# Patient Record
Sex: Male | Born: 1950 | Race: Black or African American | Hispanic: No | Marital: Single | Smoking: Former smoker
Health system: Southern US, Community
[De-identification: ages and names within clinical notes are randomized; demographics above are authoritative.]

## PROBLEM LIST (undated history)

## (undated) DIAGNOSIS — J9600 Acute respiratory failure, unspecified whether with hypoxia or hypercapnia: Secondary | ICD-10-CM

## (undated) DIAGNOSIS — I639 Cerebral infarction, unspecified: Secondary | ICD-10-CM

## (undated) DIAGNOSIS — I10 Essential (primary) hypertension: Secondary | ICD-10-CM

## (undated) DIAGNOSIS — I4891 Unspecified atrial fibrillation: Secondary | ICD-10-CM

## (undated) DIAGNOSIS — G839 Paralytic syndrome, unspecified: Secondary | ICD-10-CM

## (undated) HISTORY — PX: TRACHEOSTOMY: SUR1362

## (undated) HISTORY — PX: PEG TUBE PLACEMENT: SUR1034

---

## 2020-12-30 DIAGNOSIS — J189 Pneumonia, unspecified organism: Secondary | ICD-10-CM | POA: Diagnosis not present

## 2020-12-30 DIAGNOSIS — N189 Chronic kidney disease, unspecified: Secondary | ICD-10-CM | POA: Diagnosis not present

## 2020-12-30 DIAGNOSIS — G40911 Epilepsy, unspecified, intractable, with status epilepticus: Secondary | ICD-10-CM | POA: Diagnosis not present

## 2020-12-30 DIAGNOSIS — I482 Chronic atrial fibrillation, unspecified: Secondary | ICD-10-CM | POA: Diagnosis not present

## 2020-12-30 DIAGNOSIS — G8250 Quadriplegia, unspecified: Secondary | ICD-10-CM | POA: Diagnosis not present

## 2020-12-30 DIAGNOSIS — R6521 Severe sepsis with septic shock: Secondary | ICD-10-CM

## 2020-12-30 DIAGNOSIS — J9621 Acute and chronic respiratory failure with hypoxia: Secondary | ICD-10-CM | POA: Diagnosis not present

## 2020-12-31 DIAGNOSIS — I482 Chronic atrial fibrillation, unspecified: Secondary | ICD-10-CM | POA: Diagnosis not present

## 2020-12-31 DIAGNOSIS — J9621 Acute and chronic respiratory failure with hypoxia: Secondary | ICD-10-CM | POA: Diagnosis not present

## 2020-12-31 DIAGNOSIS — R6521 Severe sepsis with septic shock: Secondary | ICD-10-CM

## 2020-12-31 DIAGNOSIS — G8250 Quadriplegia, unspecified: Secondary | ICD-10-CM | POA: Diagnosis not present

## 2020-12-31 DIAGNOSIS — N189 Chronic kidney disease, unspecified: Secondary | ICD-10-CM

## 2020-12-31 DIAGNOSIS — J189 Pneumonia, unspecified organism: Secondary | ICD-10-CM | POA: Diagnosis not present

## 2020-12-31 DIAGNOSIS — G40911 Epilepsy, unspecified, intractable, with status epilepticus: Secondary | ICD-10-CM

## 2021-01-07 DIAGNOSIS — G40911 Epilepsy, unspecified, intractable, with status epilepticus: Secondary | ICD-10-CM

## 2021-01-07 DIAGNOSIS — J9621 Acute and chronic respiratory failure with hypoxia: Secondary | ICD-10-CM

## 2021-01-07 DIAGNOSIS — R6521 Severe sepsis with septic shock: Secondary | ICD-10-CM | POA: Diagnosis not present

## 2021-01-07 DIAGNOSIS — N189 Chronic kidney disease, unspecified: Secondary | ICD-10-CM

## 2021-01-08 DIAGNOSIS — J9621 Acute and chronic respiratory failure with hypoxia: Secondary | ICD-10-CM | POA: Diagnosis not present

## 2021-01-08 DIAGNOSIS — N189 Chronic kidney disease, unspecified: Secondary | ICD-10-CM | POA: Diagnosis not present

## 2021-01-08 DIAGNOSIS — G40911 Epilepsy, unspecified, intractable, with status epilepticus: Secondary | ICD-10-CM | POA: Diagnosis not present

## 2021-01-08 DIAGNOSIS — R6521 Severe sepsis with septic shock: Secondary | ICD-10-CM | POA: Diagnosis not present

## 2021-01-09 DIAGNOSIS — R6521 Severe sepsis with septic shock: Secondary | ICD-10-CM | POA: Diagnosis not present

## 2021-01-09 DIAGNOSIS — G40911 Epilepsy, unspecified, intractable, with status epilepticus: Secondary | ICD-10-CM | POA: Diagnosis not present

## 2021-01-09 DIAGNOSIS — J9621 Acute and chronic respiratory failure with hypoxia: Secondary | ICD-10-CM | POA: Diagnosis not present

## 2021-01-09 DIAGNOSIS — N189 Chronic kidney disease, unspecified: Secondary | ICD-10-CM | POA: Diagnosis not present

## 2021-01-10 DIAGNOSIS — N189 Chronic kidney disease, unspecified: Secondary | ICD-10-CM

## 2021-01-10 DIAGNOSIS — R6521 Severe sepsis with septic shock: Secondary | ICD-10-CM

## 2021-01-10 DIAGNOSIS — J9621 Acute and chronic respiratory failure with hypoxia: Secondary | ICD-10-CM

## 2021-01-10 DIAGNOSIS — G40911 Epilepsy, unspecified, intractable, with status epilepticus: Secondary | ICD-10-CM

## 2021-01-12 DIAGNOSIS — J9621 Acute and chronic respiratory failure with hypoxia: Secondary | ICD-10-CM | POA: Diagnosis not present

## 2021-01-12 DIAGNOSIS — G40911 Epilepsy, unspecified, intractable, with status epilepticus: Secondary | ICD-10-CM | POA: Diagnosis not present

## 2021-01-12 DIAGNOSIS — R6521 Severe sepsis with septic shock: Secondary | ICD-10-CM | POA: Diagnosis not present

## 2021-01-12 DIAGNOSIS — N189 Chronic kidney disease, unspecified: Secondary | ICD-10-CM | POA: Diagnosis not present

## 2021-01-13 DIAGNOSIS — J9621 Acute and chronic respiratory failure with hypoxia: Secondary | ICD-10-CM | POA: Diagnosis not present

## 2021-01-13 DIAGNOSIS — G40911 Epilepsy, unspecified, intractable, with status epilepticus: Secondary | ICD-10-CM | POA: Diagnosis not present

## 2021-01-13 DIAGNOSIS — R6521 Severe sepsis with septic shock: Secondary | ICD-10-CM | POA: Diagnosis not present

## 2021-01-13 DIAGNOSIS — N189 Chronic kidney disease, unspecified: Secondary | ICD-10-CM | POA: Diagnosis not present

## 2021-01-14 DIAGNOSIS — G40911 Epilepsy, unspecified, intractable, with status epilepticus: Secondary | ICD-10-CM | POA: Diagnosis not present

## 2021-01-14 DIAGNOSIS — R6521 Severe sepsis with septic shock: Secondary | ICD-10-CM | POA: Diagnosis not present

## 2021-01-14 DIAGNOSIS — J9621 Acute and chronic respiratory failure with hypoxia: Secondary | ICD-10-CM | POA: Diagnosis not present

## 2021-01-14 DIAGNOSIS — N189 Chronic kidney disease, unspecified: Secondary | ICD-10-CM | POA: Diagnosis not present

## 2021-01-15 DIAGNOSIS — J9621 Acute and chronic respiratory failure with hypoxia: Secondary | ICD-10-CM | POA: Diagnosis not present

## 2021-01-15 DIAGNOSIS — R6521 Severe sepsis with septic shock: Secondary | ICD-10-CM | POA: Diagnosis not present

## 2021-01-15 DIAGNOSIS — N189 Chronic kidney disease, unspecified: Secondary | ICD-10-CM | POA: Diagnosis not present

## 2021-01-15 DIAGNOSIS — G40911 Epilepsy, unspecified, intractable, with status epilepticus: Secondary | ICD-10-CM | POA: Diagnosis not present

## 2021-01-24 DIAGNOSIS — G40911 Epilepsy, unspecified, intractable, with status epilepticus: Secondary | ICD-10-CM | POA: Diagnosis not present

## 2021-01-24 DIAGNOSIS — N19 Unspecified kidney failure: Secondary | ICD-10-CM

## 2021-01-24 DIAGNOSIS — J9621 Acute and chronic respiratory failure with hypoxia: Secondary | ICD-10-CM

## 2021-01-24 DIAGNOSIS — R6521 Severe sepsis with septic shock: Secondary | ICD-10-CM

## 2021-02-01 ENCOUNTER — Inpatient Hospital Stay (HOSPITAL_COMMUNITY)
Admission: EM | Admit: 2021-02-01 | Discharge: 2021-02-15 | DRG: 377 | Disposition: A | Discharge status: Other Acute Inpatient Hospital | Payer: Medicare Other | Attending: Internal Medicine | Admitting: Internal Medicine

## 2021-02-01 ENCOUNTER — Encounter (HOSPITAL_COMMUNITY): Payer: Self-pay

## 2021-02-01 ENCOUNTER — Other Ambulatory Visit: Payer: Self-pay

## 2021-02-01 ENCOUNTER — Emergency Department (HOSPITAL_COMMUNITY): Payer: Medicare Other

## 2021-02-01 DIAGNOSIS — R627 Adult failure to thrive: Secondary | ICD-10-CM | POA: Diagnosis present

## 2021-02-01 DIAGNOSIS — L89150 Pressure ulcer of sacral region, unstageable: Secondary | ICD-10-CM | POA: Diagnosis present

## 2021-02-01 DIAGNOSIS — Z930 Tracheostomy status: Secondary | ICD-10-CM

## 2021-02-01 DIAGNOSIS — K7200 Acute and subacute hepatic failure without coma: Secondary | ICD-10-CM | POA: Diagnosis not present

## 2021-02-01 DIAGNOSIS — R58 Hemorrhage, not elsewhere classified: Secondary | ICD-10-CM | POA: Diagnosis present

## 2021-02-01 DIAGNOSIS — E870 Hyperosmolality and hypernatremia: Secondary | ICD-10-CM | POA: Diagnosis present

## 2021-02-01 DIAGNOSIS — A4102 Sepsis due to Methicillin resistant Staphylococcus aureus: Secondary | ICD-10-CM | POA: Diagnosis not present

## 2021-02-01 DIAGNOSIS — R911 Solitary pulmonary nodule: Secondary | ICD-10-CM | POA: Diagnosis present

## 2021-02-01 DIAGNOSIS — Z6828 Body mass index (BMI) 28.0-28.9, adult: Secondary | ICD-10-CM

## 2021-02-01 DIAGNOSIS — R578 Other shock: Secondary | ICD-10-CM | POA: Diagnosis not present

## 2021-02-01 DIAGNOSIS — D649 Anemia, unspecified: Secondary | ICD-10-CM

## 2021-02-01 DIAGNOSIS — I6330 Cerebral infarction due to thrombosis of unspecified cerebral artery: Secondary | ICD-10-CM | POA: Insufficient documentation

## 2021-02-01 DIAGNOSIS — Z87891 Personal history of nicotine dependence: Secondary | ICD-10-CM

## 2021-02-01 DIAGNOSIS — R6521 Severe sepsis with septic shock: Secondary | ICD-10-CM | POA: Diagnosis not present

## 2021-02-01 DIAGNOSIS — J9600 Acute respiratory failure, unspecified whether with hypoxia or hypercapnia: Secondary | ICD-10-CM

## 2021-02-01 DIAGNOSIS — Z8673 Personal history of transient ischemic attack (TIA), and cerebral infarction without residual deficits: Secondary | ICD-10-CM

## 2021-02-01 DIAGNOSIS — J15212 Pneumonia due to Methicillin resistant Staphylococcus aureus: Secondary | ICD-10-CM | POA: Diagnosis present

## 2021-02-01 DIAGNOSIS — K264 Chronic or unspecified duodenal ulcer with hemorrhage: Secondary | ICD-10-CM | POA: Diagnosis not present

## 2021-02-01 DIAGNOSIS — K2971 Gastritis, unspecified, with bleeding: Secondary | ICD-10-CM | POA: Diagnosis present

## 2021-02-01 DIAGNOSIS — E876 Hypokalemia: Secondary | ICD-10-CM | POA: Diagnosis not present

## 2021-02-01 DIAGNOSIS — E872 Acidosis: Secondary | ICD-10-CM | POA: Diagnosis present

## 2021-02-01 DIAGNOSIS — J9621 Acute and chronic respiratory failure with hypoxia: Secondary | ICD-10-CM | POA: Diagnosis present

## 2021-02-01 DIAGNOSIS — G9340 Encephalopathy, unspecified: Secondary | ICD-10-CM

## 2021-02-01 DIAGNOSIS — J90 Pleural effusion, not elsewhere classified: Secondary | ICD-10-CM | POA: Diagnosis present

## 2021-02-01 DIAGNOSIS — K625 Hemorrhage of anus and rectum: Secondary | ICD-10-CM | POA: Diagnosis not present

## 2021-02-01 DIAGNOSIS — R64 Cachexia: Secondary | ICD-10-CM | POA: Diagnosis present

## 2021-02-01 DIAGNOSIS — I4891 Unspecified atrial fibrillation: Secondary | ICD-10-CM | POA: Diagnosis present

## 2021-02-01 DIAGNOSIS — D62 Acute posthemorrhagic anemia: Secondary | ICD-10-CM | POA: Diagnosis present

## 2021-02-01 DIAGNOSIS — L89310 Pressure ulcer of right buttock, unstageable: Secondary | ICD-10-CM | POA: Diagnosis present

## 2021-02-01 DIAGNOSIS — E875 Hyperkalemia: Secondary | ICD-10-CM | POA: Diagnosis present

## 2021-02-01 DIAGNOSIS — I6603 Occlusion and stenosis of bilateral middle cerebral arteries: Secondary | ICD-10-CM | POA: Diagnosis not present

## 2021-02-01 DIAGNOSIS — G8250 Quadriplegia, unspecified: Secondary | ICD-10-CM | POA: Diagnosis present

## 2021-02-01 DIAGNOSIS — G928 Other toxic encephalopathy: Secondary | ICD-10-CM | POA: Diagnosis present

## 2021-02-01 DIAGNOSIS — K5900 Constipation, unspecified: Secondary | ICD-10-CM | POA: Diagnosis present

## 2021-02-01 DIAGNOSIS — N170 Acute kidney failure with tubular necrosis: Secondary | ICD-10-CM | POA: Diagnosis present

## 2021-02-01 DIAGNOSIS — L89120 Pressure ulcer of left upper back, unstageable: Secondary | ICD-10-CM | POA: Diagnosis present

## 2021-02-01 DIAGNOSIS — I480 Paroxysmal atrial fibrillation: Secondary | ICD-10-CM | POA: Diagnosis present

## 2021-02-01 DIAGNOSIS — N179 Acute kidney failure, unspecified: Secondary | ICD-10-CM | POA: Diagnosis present

## 2021-02-01 DIAGNOSIS — L89320 Pressure ulcer of left buttock, unstageable: Secondary | ICD-10-CM | POA: Diagnosis present

## 2021-02-01 DIAGNOSIS — R29738 NIHSS score 38: Secondary | ICD-10-CM | POA: Diagnosis present

## 2021-02-01 DIAGNOSIS — Z20822 Contact with and (suspected) exposure to covid-19: Secondary | ICD-10-CM | POA: Diagnosis present

## 2021-02-01 DIAGNOSIS — I6381 Other cerebral infarction due to occlusion or stenosis of small artery: Secondary | ICD-10-CM | POA: Diagnosis not present

## 2021-02-01 DIAGNOSIS — J189 Pneumonia, unspecified organism: Secondary | ICD-10-CM

## 2021-02-01 DIAGNOSIS — Z430 Encounter for attention to tracheostomy: Secondary | ICD-10-CM

## 2021-02-01 DIAGNOSIS — L8990 Pressure ulcer of unspecified site, unspecified stage: Secondary | ICD-10-CM | POA: Insufficient documentation

## 2021-02-01 DIAGNOSIS — K2101 Gastro-esophageal reflux disease with esophagitis, with bleeding: Secondary | ICD-10-CM | POA: Diagnosis present

## 2021-02-01 DIAGNOSIS — Z931 Gastrostomy status: Secondary | ICD-10-CM

## 2021-02-01 DIAGNOSIS — I10 Essential (primary) hypertension: Secondary | ICD-10-CM | POA: Diagnosis present

## 2021-02-01 HISTORY — DX: Unspecified atrial fibrillation: I48.91

## 2021-02-01 HISTORY — DX: Paralytic syndrome, unspecified: I63.9

## 2021-02-01 HISTORY — DX: Acute respiratory failure, unspecified whether with hypoxia or hypercapnia: J96.00

## 2021-02-01 HISTORY — DX: Cerebral infarction, unspecified: G83.9

## 2021-02-01 HISTORY — DX: Essential (primary) hypertension: I10

## 2021-02-01 LAB — BASIC METABOLIC PANEL
Anion gap: 11 (ref 5–15)
BUN: 87 mg/dL — ABNORMAL HIGH (ref 8–23)
CO2: 23 mmol/L (ref 22–32)
Calcium: 10.9 mg/dL — ABNORMAL HIGH (ref 8.9–10.3)
Chloride: 116 mmol/L — ABNORMAL HIGH (ref 98–111)
Creatinine, Ser: 2.25 mg/dL — ABNORMAL HIGH (ref 0.61–1.24)
GFR, Estimated: 30 mL/min — ABNORMAL LOW (ref >60)
Glucose, Bld: 132 mg/dL — ABNORMAL HIGH (ref 70–99)
Potassium: 5.4 mmol/L — ABNORMAL HIGH (ref 3.5–5.1)
Sodium: 150 mmol/L — ABNORMAL HIGH (ref 135–145)

## 2021-02-01 LAB — CBC WITH DIFFERENTIAL/PLATELET
Abs Immature Granulocytes: 0.24 10*3/uL — ABNORMAL HIGH (ref 0.00–0.07)
Basophils Absolute: 0.1 10*3/uL (ref 0.0–0.1)
Basophils Relative: 0 %
Eosinophils Absolute: 0.1 10*3/uL (ref 0.0–0.5)
Eosinophils Relative: 1 %
HCT: 26 % — ABNORMAL LOW (ref 39.0–52.0)
Hemoglobin: 7.5 g/dL — ABNORMAL LOW (ref 13.0–17.0)
Immature Granulocytes: 1 %
Lymphocytes Relative: 15 %
Lymphs Abs: 3.2 10*3/uL (ref 0.7–4.0)
MCH: 25.9 pg — ABNORMAL LOW (ref 26.0–34.0)
MCHC: 28.8 g/dL — ABNORMAL LOW (ref 30.0–36.0)
MCV: 89.7 fL (ref 80.0–100.0)
Monocytes Absolute: 1.5 10*3/uL — ABNORMAL HIGH (ref 0.1–1.0)
Monocytes Relative: 7 %
Neutro Abs: 17 10*3/uL — ABNORMAL HIGH (ref 1.7–7.7)
Neutrophils Relative %: 76 %
Platelets: 290 10*3/uL (ref 150–400)
RBC: 2.9 MIL/uL — ABNORMAL LOW (ref 4.22–5.81)
RDW: 18.3 % — ABNORMAL HIGH (ref 11.5–15.5)
WBC: 22.1 10*3/uL — ABNORMAL HIGH (ref 4.0–10.5)
nRBC: 0.1 % (ref 0.0–0.2)

## 2021-02-01 LAB — PROTIME-INR
INR: 1.3 — ABNORMAL HIGH (ref 0.8–1.2)
Prothrombin Time: 16 seconds — ABNORMAL HIGH (ref 11.4–15.2)

## 2021-02-01 MED ORDER — SODIUM CHLORIDE 0.9 % IV BOLUS
500.0000 mL | Freq: Once | INTRAVENOUS | Status: AC
  Administered 2021-02-01: 500 mL via INTRAVENOUS

## 2021-02-01 NOTE — ED Provider Notes (Addendum)
[facility]-EMERGENCY DEPT Provider Note   CSN: [id number] Arrival date & time: 02/01/21  1902     History Chief Complaint  Patient presents with  . Rectal Bleeding    Justin Solomon is a 70 y.o. male.  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  Patient is nonverbal, moans but otherwise is noncontributory.  No family at bedside.  Report from staff is that when they went to change his diaper there was bright red blood and a few clots.  No history of GI bleeding for the patient in the past.  No reported fever or vomiting.  Is noted to be on subcutaneous heparin.  Past Medical History:  Diagnosis Date  . Acute respiratory failure (HCC)   . Atrial fibrillation (HCC)   . Essential hypertension i  . Paralysis due to acute stroke (HCC)    Quadraplegic    There are no problems to display for this patient.    The histories are not reviewed yet. Please review them in the "History" navigator section and refresh this SmartLink.     No family history on file.  Social History   Tobacco Use  . Smoking status: Unknown If Ever Smoked  Substance Use Topics  . Alcohol use: Not Currently  . Drug use: Not Currently    Home Medications Prior to Admission medications   Not on File    Allergies    Patient has no allergy information on record.  Review of Systems   Review of Systems  Unable to perform ROS: Patient nonverbal    Physical Exam Updated Vital Signs BP (!) 124/108   Pulse 95   Temp 97.9 F (36.6 C) (Axillary)   Resp (!) 25   Ht 6' (1.829 m)   Wt 104.8 kg   SpO2 100%   BMI 31.33 kg/m   Physical Exam Vitals and nursing note reviewed.  Constitutional:      Appearance: Normal appearance.     Comments:  Nonverbal, moaning  HENT:     Head: Normocephalic.     Mouth/Throat:     Mouth: Mucous membranes are dry.  Eyes:     Comments: Equal pupils  Neck:     Comments: C-collar in place Cardiovascular:     Rate and Rhythm: Normal rate.  Pulmonary:     Comments: Tachypneic at times, trach in place with collar Abdominal:     Comments: Thin abdomen, no distention, unable to evaluate for tenderness as the patient is continuously moans  Genitourinary:    Comments: Bright red blood and dried clots mixed with stool present at rectum Musculoskeletal:     Comments: Contracted extremities  Skin:    General: Skin is warm.  Neurological:     Mental Status: He is alert. Mental status is at baseline.     ED Results / Procedures / Treatments   Labs (all labs ordered are listed, but only abnormal results are displayed) Labs Reviewed  CBC WITH DIFFERENTIAL/PLATELET  BASIC METABOLIC PANEL  PROTIME-INR  LACTIC ACID, PLASMA  LACTIC ACID, PLASMA  TYPE AND SCREEN    EKG None  Radiology No results found.  Procedures Ultrasound ED Peripheral IV (Provider)  Date/Time: 02/02/2021 12:00 AM Performed by: Rozelle Logan, DO Authorized by: Rozelle Logan,  DO   Procedure details:    Indications: hypotension and poor IV access     Skin Prep: isopropyl alcohol     Location:  Left AC   Angiocath:  18 G   Bedside Ultrasound Guided: Yes     Images: not archived     Patient tolerated procedure without complications: Yes     Dressing applied: Yes   .Critical Care Performed by: Rozelle Logan, DO Authorized by: Rozelle Logan, DO   Critical care provider statement:    Critical care time (minutes):  45   Critical care was necessary to treat or prevent imminent or life-threatening deterioration of the following conditions:  Circulatory failure and renal failure   Critical care was time spent personally by me on the following activities:  Discussions with consultants, evaluation of  patient's response to treatment, examination of patient, ordering and performing treatments and interventions, ordering and review of laboratory studies, ordering and review of radiographic studies, pulse oximetry, re-evaluation of patient's condition, obtaining history from patient or surrogate and review of old charts     Medications Ordered in ED Medications  sodium chloride 0.9 % bolus 500 mL (has no administration in time range)    ED Course  I have reviewed the triage vital signs and the nursing notes.  Pertinent labs & imaging results that were available during my care of the patient were reviewed by me and considered in my medical decision making (see chart for details).    MDM Rules/Calculators/A&P                          70 year old quadriplegic nonverbal male presents emergency department from nursing facility for reported bloody bowel movement.  He is tachypneic on arrival but otherwise normal heart rate, stable blood pressure.  He is moaning which is reported to be baseline.  Contracted extremities.  When the patient was evaluated he had clots at the rectum, no active bleeding.  He is noted to be anticoagulated on subcutaneous heparin from his MAR from the facility.  Patient has minimal IV access given the contracted extremities, he has a c-collar in place limiting the neck for access.  I was able to place an 18-gauge ultrasound-guided IV in the left upper extremity.  Blood work has been sent.  No signs of active rectal bleeding.  Paperwork from the nursing facility notes that his baseline anemia is 9.0.  Hb down to 7.5.  Blood work shows a new kidney injury, urinalysis is pending.  CAT scan shows proctitis versus stercoral colitis, lactic is 3, he has leukocytosis over 22.  Zosyn has been ordered, patient has been hydrated, blood transfusion has been ordered.   I re evaluated patient after CT and no ongoing bleeding or clots. VSS. Patient will be admitted with GI bleeding in  the setting of anticoagulation with AKI and infection.  Vitals remained stable at this time.  Patients evaluation and results requires admission for further treatment and care. Patient agrees with admission plan, offers no new complaints and is stable/unchanged at time of admit.  Final Clinical Impression(s) / ED Diagnoses Final diagnoses:  None    Rx / DC Orders ED Discharge Orders    None       Rozelle Logan, DO 02/01/21 2311    Rozelle Logan, DO 02/02/21 0001    Kaloni Bisaillon, Clabe Seal, DO 02/02/21 0005    Lorry Furber, Clabe Seal, DO 02/02/21 0025

## 2021-02-01 NOTE — ED Triage Notes (Signed)
Patient BIB [facility] from [facility] for rectal bleeding. EMS reports patient is quadriplegic from CVA earlier this year. Patient also has a trach. Patient in non-verbal and moans. Staff reported changing patient's diaper.and finding blood and clots which is a new finding.

## 2021-02-02 ENCOUNTER — Inpatient Hospital Stay (HOSPITAL_COMMUNITY): Payer: Medicare Other

## 2021-02-02 ENCOUNTER — Encounter (HOSPITAL_COMMUNITY): Payer: Self-pay | Admitting: Internal Medicine

## 2021-02-02 ENCOUNTER — Other Ambulatory Visit: Payer: Self-pay

## 2021-02-02 ENCOUNTER — Encounter (HOSPITAL_COMMUNITY)
Admission: EM | Disposition: A | Discharge status: Nursing Home (Includes ICF) | Payer: Self-pay | Source: Home / Self Care | Attending: Pulmonary Disease

## 2021-02-02 DIAGNOSIS — G9341 Metabolic encephalopathy: Secondary | ICD-10-CM | POA: Diagnosis not present

## 2021-02-02 DIAGNOSIS — I6349 Cerebral infarction due to embolism of other cerebral artery: Secondary | ICD-10-CM | POA: Diagnosis not present

## 2021-02-02 DIAGNOSIS — D62 Acute posthemorrhagic anemia: Secondary | ICD-10-CM | POA: Diagnosis present

## 2021-02-02 DIAGNOSIS — N179 Acute kidney failure, unspecified: Secondary | ICD-10-CM | POA: Diagnosis present

## 2021-02-02 DIAGNOSIS — E870 Hyperosmolality and hypernatremia: Secondary | ICD-10-CM | POA: Diagnosis present

## 2021-02-02 DIAGNOSIS — L8945 Pressure ulcer of contiguous site of back, buttock and hip, unstageable: Secondary | ICD-10-CM | POA: Diagnosis not present

## 2021-02-02 DIAGNOSIS — K264 Chronic or unspecified duodenal ulcer with hemorrhage: Secondary | ICD-10-CM | POA: Diagnosis present

## 2021-02-02 DIAGNOSIS — I6330 Cerebral infarction due to thrombosis of unspecified cerebral artery: Secondary | ICD-10-CM | POA: Diagnosis not present

## 2021-02-02 DIAGNOSIS — J90 Pleural effusion, not elsewhere classified: Secondary | ICD-10-CM | POA: Diagnosis present

## 2021-02-02 DIAGNOSIS — K625 Hemorrhage of anus and rectum: Secondary | ICD-10-CM | POA: Diagnosis not present

## 2021-02-02 DIAGNOSIS — L89320 Pressure ulcer of left buttock, unstageable: Secondary | ICD-10-CM | POA: Diagnosis present

## 2021-02-02 DIAGNOSIS — R4182 Altered mental status, unspecified: Secondary | ICD-10-CM | POA: Diagnosis not present

## 2021-02-02 DIAGNOSIS — K7200 Acute and subacute hepatic failure without coma: Secondary | ICD-10-CM | POA: Diagnosis not present

## 2021-02-02 DIAGNOSIS — I4891 Unspecified atrial fibrillation: Secondary | ICD-10-CM | POA: Diagnosis not present

## 2021-02-02 DIAGNOSIS — R64 Cachexia: Secondary | ICD-10-CM | POA: Diagnosis present

## 2021-02-02 DIAGNOSIS — A4102 Sepsis due to Methicillin resistant Staphylococcus aureus: Secondary | ICD-10-CM | POA: Diagnosis not present

## 2021-02-02 DIAGNOSIS — I10 Essential (primary) hypertension: Secondary | ICD-10-CM | POA: Diagnosis present

## 2021-02-02 DIAGNOSIS — G9340 Encephalopathy, unspecified: Secondary | ICD-10-CM | POA: Diagnosis not present

## 2021-02-02 DIAGNOSIS — K2971 Gastritis, unspecified, with bleeding: Secondary | ICD-10-CM | POA: Diagnosis present

## 2021-02-02 DIAGNOSIS — G8250 Quadriplegia, unspecified: Secondary | ICD-10-CM

## 2021-02-02 DIAGNOSIS — R6521 Severe sepsis with septic shock: Secondary | ICD-10-CM | POA: Diagnosis not present

## 2021-02-02 DIAGNOSIS — Z7189 Other specified counseling: Secondary | ICD-10-CM | POA: Diagnosis not present

## 2021-02-02 DIAGNOSIS — R578 Other shock: Secondary | ICD-10-CM | POA: Diagnosis not present

## 2021-02-02 DIAGNOSIS — D649 Anemia, unspecified: Secondary | ICD-10-CM | POA: Diagnosis not present

## 2021-02-02 DIAGNOSIS — R58 Hemorrhage, not elsewhere classified: Secondary | ICD-10-CM | POA: Diagnosis present

## 2021-02-02 DIAGNOSIS — J15212 Pneumonia due to Methicillin resistant Staphylococcus aureus: Secondary | ICD-10-CM | POA: Diagnosis not present

## 2021-02-02 DIAGNOSIS — E875 Hyperkalemia: Secondary | ICD-10-CM | POA: Diagnosis not present

## 2021-02-02 DIAGNOSIS — J189 Pneumonia, unspecified organism: Secondary | ICD-10-CM | POA: Diagnosis not present

## 2021-02-02 DIAGNOSIS — J9621 Acute and chronic respiratory failure with hypoxia: Secondary | ICD-10-CM | POA: Diagnosis not present

## 2021-02-02 DIAGNOSIS — I6381 Other cerebral infarction due to occlusion or stenosis of small artery: Secondary | ICD-10-CM | POA: Diagnosis not present

## 2021-02-02 DIAGNOSIS — J9600 Acute respiratory failure, unspecified whether with hypoxia or hypercapnia: Secondary | ICD-10-CM | POA: Diagnosis not present

## 2021-02-02 DIAGNOSIS — E872 Acidosis: Secondary | ICD-10-CM | POA: Diagnosis present

## 2021-02-02 DIAGNOSIS — I480 Paroxysmal atrial fibrillation: Secondary | ICD-10-CM | POA: Diagnosis not present

## 2021-02-02 DIAGNOSIS — J9601 Acute respiratory failure with hypoxia: Secondary | ICD-10-CM | POA: Diagnosis not present

## 2021-02-02 DIAGNOSIS — N170 Acute kidney failure with tubular necrosis: Secondary | ICD-10-CM | POA: Diagnosis present

## 2021-02-02 DIAGNOSIS — L89150 Pressure ulcer of sacral region, unstageable: Secondary | ICD-10-CM | POA: Diagnosis present

## 2021-02-02 DIAGNOSIS — I6389 Other cerebral infarction: Secondary | ICD-10-CM | POA: Diagnosis not present

## 2021-02-02 DIAGNOSIS — I6603 Occlusion and stenosis of bilateral middle cerebral arteries: Secondary | ICD-10-CM | POA: Diagnosis not present

## 2021-02-02 DIAGNOSIS — K2101 Gastro-esophageal reflux disease with esophagitis, with bleeding: Secondary | ICD-10-CM | POA: Diagnosis present

## 2021-02-02 DIAGNOSIS — Z20822 Contact with and (suspected) exposure to covid-19: Secondary | ICD-10-CM | POA: Diagnosis present

## 2021-02-02 DIAGNOSIS — E43 Unspecified severe protein-calorie malnutrition: Secondary | ICD-10-CM | POA: Diagnosis not present

## 2021-02-02 DIAGNOSIS — G928 Other toxic encephalopathy: Secondary | ICD-10-CM | POA: Diagnosis present

## 2021-02-02 DIAGNOSIS — L89120 Pressure ulcer of left upper back, unstageable: Secondary | ICD-10-CM | POA: Diagnosis present

## 2021-02-02 HISTORY — PX: ESOPHAGOGASTRODUODENOSCOPY: SHX5428

## 2021-02-02 HISTORY — PX: FLEXIBLE SIGMOIDOSCOPY: SHX5431

## 2021-02-02 LAB — ABO/RH: ABO/RH(D): O POS

## 2021-02-02 LAB — CBC
HCT: 49.1 % (ref 39.0–52.0)
HCT: 29.3 % — ABNORMAL LOW (ref 39.0–52.0)
HCT: 29.5 % — ABNORMAL LOW (ref 39.0–52.0)
Hemoglobin: 16.2 g/dL (ref 13.0–17.0)
Hemoglobin: 9.1 g/dL — ABNORMAL LOW (ref 13.0–17.0)
Hemoglobin: 9 g/dL — ABNORMAL LOW (ref 13.0–17.0)
MCH: 32 pg (ref 26.0–34.0)
MCH: 27.6 pg (ref 26.0–34.0)
MCH: 27.4 pg (ref 26.0–34.0)
MCHC: 33 g/dL (ref 30.0–36.0)
MCHC: 31.1 g/dL (ref 30.0–36.0)
MCHC: 30.5 g/dL (ref 30.0–36.0)
MCV: 96.8 fL (ref 80.0–100.0)
MCV: 88.8 fL (ref 80.0–100.0)
MCV: 89.7 fL (ref 80.0–100.0)
Platelets: 37 10*3/uL — ABNORMAL LOW (ref 150–400)
Platelets: 280 10*3/uL (ref 150–400)
Platelets: 242 10*3/uL (ref 150–400)
RBC: 5.07 MIL/uL (ref 4.22–5.81)
RBC: 3.3 MIL/uL — ABNORMAL LOW (ref 4.22–5.81)
RBC: 3.29 MIL/uL — ABNORMAL LOW (ref 4.22–5.81)
RDW: 13.2 % (ref 11.5–15.5)
RDW: 16.9 % — ABNORMAL HIGH (ref 11.5–15.5)
RDW: 17.2 % — ABNORMAL HIGH (ref 11.5–15.5)
WBC: <0.1 10*3/uL — CL (ref 4.0–10.5)
WBC: 29 10*3/uL — ABNORMAL HIGH (ref 4.0–10.5)
WBC: 22.7 10*3/uL — ABNORMAL HIGH (ref 4.0–10.5)
nRBC: 0 % (ref 0.0–0.2)
nRBC: 0.9 % — ABNORMAL HIGH (ref 0.0–0.2)
nRBC: 1.4 % — ABNORMAL HIGH (ref 0.0–0.2)

## 2021-02-02 LAB — URINALYSIS, ROUTINE W REFLEX MICROSCOPIC
Bilirubin Urine: NEGATIVE
Glucose, UA: NEGATIVE mg/dL
Hgb urine dipstick: NEGATIVE
Ketones, ur: NEGATIVE mg/dL
Nitrite: NEGATIVE
Protein, ur: 30 mg/dL — AB
Specific Gravity, Urine: 1.014 (ref 1.005–1.030)
WBC, UA: >50 WBC/hpf — ABNORMAL HIGH (ref 0–5)
pH: 5 (ref 5.0–8.0)

## 2021-02-02 LAB — PREPARE RBC (CROSSMATCH)

## 2021-02-02 LAB — CBC WITH DIFFERENTIAL/PLATELET
Abs Immature Granulocytes: 0.77 10*3/uL — ABNORMAL HIGH (ref 0.00–0.07)
Basophils Absolute: 0.1 10*3/uL (ref 0.0–0.1)
Basophils Relative: 0 %
Eosinophils Absolute: 0.1 10*3/uL (ref 0.0–0.5)
Eosinophils Relative: 0 %
HCT: 24.1 % — ABNORMAL LOW (ref 39.0–52.0)
Hemoglobin: 7.1 g/dL — ABNORMAL LOW (ref 13.0–17.0)
Immature Granulocytes: 3 %
Lymphocytes Relative: 19 %
Lymphs Abs: 4.6 10*3/uL — ABNORMAL HIGH (ref 0.7–4.0)
MCH: 26.7 pg (ref 26.0–34.0)
MCHC: 29.5 g/dL — ABNORMAL LOW (ref 30.0–36.0)
MCV: 90.6 fL (ref 80.0–100.0)
Monocytes Absolute: 1.9 10*3/uL — ABNORMAL HIGH (ref 0.1–1.0)
Monocytes Relative: 8 %
Neutro Abs: 17.1 10*3/uL — ABNORMAL HIGH (ref 1.7–7.7)
Neutrophils Relative %: 70 %
Platelets: 269 10*3/uL (ref 150–400)
RBC: 2.66 MIL/uL — ABNORMAL LOW (ref 4.22–5.81)
RDW: 18.3 % — ABNORMAL HIGH (ref 11.5–15.5)
WBC: 24.5 10*3/uL — ABNORMAL HIGH (ref 4.0–10.5)
nRBC: 0.5 % — ABNORMAL HIGH (ref 0.0–0.2)

## 2021-02-02 LAB — PROTIME-INR
INR: 1.7 — ABNORMAL HIGH (ref 0.8–1.2)
Prothrombin Time: 19.9 seconds — ABNORMAL HIGH (ref 11.4–15.2)

## 2021-02-02 LAB — COMPREHENSIVE METABOLIC PANEL
ALT: 341 U/L — ABNORMAL HIGH (ref 0–44)
ALT: 565 U/L — ABNORMAL HIGH (ref 0–44)
AST: 219 U/L — ABNORMAL HIGH (ref 15–41)
AST: 499 U/L — ABNORMAL HIGH (ref 15–41)
Albumin: 1.8 g/dL — ABNORMAL LOW (ref 3.5–5.0)
Albumin: 1.9 g/dL — ABNORMAL LOW (ref 3.5–5.0)
Alkaline Phosphatase: 218 U/L — ABNORMAL HIGH (ref 38–126)
Alkaline Phosphatase: 237 U/L — ABNORMAL HIGH (ref 38–126)
Anion gap: 16 — ABNORMAL HIGH (ref 5–15)
Anion gap: 18 — ABNORMAL HIGH (ref 5–15)
BUN: 98 mg/dL — ABNORMAL HIGH (ref 8–23)
BUN: 101 mg/dL — ABNORMAL HIGH (ref 8–23)
CO2: 18 mmol/L — ABNORMAL LOW (ref 22–32)
CO2: 18 mmol/L — ABNORMAL LOW (ref 22–32)
Calcium: 10.3 mg/dL (ref 8.9–10.3)
Calcium: 9.9 mg/dL (ref 8.9–10.3)
Chloride: 115 mmol/L — ABNORMAL HIGH (ref 98–111)
Chloride: 113 mmol/L — ABNORMAL HIGH (ref 98–111)
Creatinine, Ser: 2.76 mg/dL — ABNORMAL HIGH (ref 0.61–1.24)
Creatinine, Ser: 3.24 mg/dL — ABNORMAL HIGH (ref 0.61–1.24)
GFR, Estimated: 24 mL/min — ABNORMAL LOW (ref >60)
GFR, Estimated: 20 mL/min — ABNORMAL LOW (ref >60)
Glucose, Bld: 92 mg/dL (ref 70–99)
Glucose, Bld: 236 mg/dL — ABNORMAL HIGH (ref 70–99)
Potassium: 6.2 mmol/L — ABNORMAL HIGH (ref 3.5–5.1)
Potassium: 5.9 mmol/L — ABNORMAL HIGH (ref 3.5–5.1)
Sodium: 149 mmol/L — ABNORMAL HIGH (ref 135–145)
Sodium: 149 mmol/L — ABNORMAL HIGH (ref 135–145)
Total Bilirubin: 1.1 mg/dL (ref 0.3–1.2)
Total Bilirubin: 1.5 mg/dL — ABNORMAL HIGH (ref 0.3–1.2)
Total Protein: 7.7 g/dL (ref 6.5–8.1)
Total Protein: 7.7 g/dL (ref 6.5–8.1)

## 2021-02-02 LAB — RESP PANEL BY RT-PCR (FLU A&B, COVID) ARPGX2
Influenza A by PCR: NEGATIVE
Influenza B by PCR: NEGATIVE
SARS Coronavirus 2 by RT PCR: NEGATIVE

## 2021-02-02 LAB — BLOOD GAS, ARTERIAL
Acid-base deficit: 18.4 mmol/L — ABNORMAL HIGH (ref 0.0–2.0)
Acid-base deficit: 13.4 mmol/L — ABNORMAL HIGH (ref 0.0–2.0)
Allens test (pass/fail): UNDETERMINED — AB
Bicarbonate: 11 mmol/L — ABNORMAL LOW (ref 20.0–28.0)
Bicarbonate: 13.4 mmol/L — ABNORMAL LOW (ref 20.0–28.0)
Drawn by: 25770
FIO2: 80
O2 Content: 15 L/min
O2 Saturation: 93.3 %
O2 Saturation: 99.4 %
Patient temperature: 99.7
Patient temperature: 37
pCO2 arterial: 47.1 mmHg (ref 32.0–48.0)
pCO2 arterial: 35.7 mmHg (ref 32.0–48.0)
pH, Arterial: 7.004 — CL (ref 7.350–7.450)
pH, Arterial: 7.199 — CL (ref 7.350–7.450)
pO2, Arterial: 120 mmHg — ABNORMAL HIGH (ref 83.0–108.0)
pO2, Arterial: 234 mmHg — ABNORMAL HIGH (ref 83.0–108.0)

## 2021-02-02 LAB — GLUCOSE, CAPILLARY
Glucose-Capillary: 114 mg/dL — ABNORMAL HIGH (ref 70–99)
Glucose-Capillary: 19 mg/dL — CL (ref 70–99)
Glucose-Capillary: 103 mg/dL — ABNORMAL HIGH (ref 70–99)
Glucose-Capillary: 158 mg/dL — ABNORMAL HIGH (ref 70–99)
Glucose-Capillary: 153 mg/dL — ABNORMAL HIGH (ref 70–99)

## 2021-02-02 LAB — MRSA PCR SCREENING: MRSA by PCR: POSITIVE — AB

## 2021-02-02 LAB — PHOSPHORUS: Phosphorus: 7.3 mg/dL — ABNORMAL HIGH (ref 2.5–4.6)

## 2021-02-02 LAB — LACTIC ACID, PLASMA
Lactic Acid, Venous: 3 mmol/L (ref 0.5–1.9)
Lactic Acid, Venous: 3.1 mmol/L (ref 0.5–1.9)

## 2021-02-02 LAB — CBG MONITORING, ED: Glucose-Capillary: 89 mg/dL (ref 70–99)

## 2021-02-02 LAB — MAGNESIUM: Magnesium: 2.8 mg/dL — ABNORMAL HIGH (ref 1.7–2.4)

## 2021-02-02 SURGERY — SIGMOIDOSCOPY, FLEXIBLE
Anesthesia: Moderate Sedation

## 2021-02-02 MED ORDER — CHLORHEXIDINE GLUCONATE 0.12% ORAL RINSE (MEDLINE KIT)
15.0000 mL | Freq: Two times a day (BID) | OROMUCOSAL | Status: DC
  Administered 2021-02-02 – 2021-02-13 (×22): 15 mL via OROMUCOSAL

## 2021-02-02 MED ORDER — SODIUM CHLORIDE 0.9 % IV SOLN
8.0000 mg/h | INTRAVENOUS | Status: DC
  Filled 2021-02-02: qty 80

## 2021-02-02 MED ORDER — PANTOPRAZOLE SODIUM 40 MG IV SOLR
40.0000 mg | Freq: Two times a day (BID) | INTRAVENOUS | Status: DC
  Administered 2021-02-02 – 2021-02-14 (×24): 40 mg via INTRAVENOUS
  Filled 2021-02-02 (×23): qty 40

## 2021-02-02 MED ORDER — MIDAZOLAM HCL (PF) 5 MG/ML IJ SOLN
INTRAMUSCULAR | Status: AC
  Filled 2021-02-02: qty 2

## 2021-02-02 MED ORDER — VASOPRESSIN 20 UNIT/ML IV SOLN
0.0000 [IU]/min | INTRAVENOUS | Status: DC
  Administered 2021-02-02 – 2021-02-03 (×5): 0.2 [IU]/min via INTRAVENOUS
  Filled 2021-02-02 (×10): qty 2.5

## 2021-02-02 MED ORDER — DEXTROSE 50 % IV SOLN: 1.0000 | INTRAVENOUS | Status: AC

## 2021-02-02 MED ORDER — DIGOXIN 0.25 MG/ML IJ SOLN
0.2500 mg | Freq: Once | INTRAMUSCULAR | Status: AC
  Administered 2021-02-02: 0.25 mg via INTRAVENOUS
  Filled 2021-02-02: qty 2

## 2021-02-02 MED ORDER — PIPERACILLIN-TAZOBACTAM 3.375 G IVPB
3.3750 g | Freq: Three times a day (TID) | INTRAVENOUS | Status: DC
  Administered 2021-02-02: 3.375 g via INTRAVENOUS
  Filled 2021-02-02: qty 50

## 2021-02-02 MED ORDER — DEXTROSE 5 % IV SOLN: INTRAVENOUS | Status: DC

## 2021-02-02 MED ORDER — SODIUM BICARBONATE 8.4 % IV SOLN
INTRAVENOUS | Status: AC
  Filled 2021-02-02: qty 50

## 2021-02-02 MED ORDER — CALCIUM GLUCONATE-NACL 2-0.675 GM/100ML-% IV SOLN
2.0000 g | Freq: Once | INTRAVENOUS | Status: AC
  Administered 2021-02-02: 2000 mg via INTRAVENOUS
  Filled 2021-02-02: qty 100

## 2021-02-02 MED ORDER — PANTOPRAZOLE SODIUM 40 MG IV SOLR: 40.0000 mg | Freq: Two times a day (BID) | INTRAVENOUS | Status: DC

## 2021-02-02 MED ORDER — MIDAZOLAM HCL 2 MG/2ML IJ SOLN
INTRAMUSCULAR | Status: AC
  Filled 2021-02-02: qty 4

## 2021-02-02 MED ORDER — NOREPINEPHRINE 4 MG/250ML-% IV SOLN
0.0000 ug/min | INTRAVENOUS | Status: DC
  Administered 2021-02-02: 16 ug/min via INTRAVENOUS
  Administered 2021-02-02: 32 ug/min via INTRAVENOUS
  Administered 2021-02-03: 4 ug/min via INTRAVENOUS
  Administered 2021-02-03: 9 ug/min via INTRAVENOUS
  Administered 2021-02-04: 3 ug/min via INTRAVENOUS
  Administered 2021-02-06: 7 ug/min via INTRAVENOUS
  Administered 2021-02-07: 6 ug/min via INTRAVENOUS
  Administered 2021-02-08: 2 ug/min via INTRAVENOUS
  Administered 2021-02-08: 4 ug/min via INTRAVENOUS
  Administered 2021-02-10: 2 ug/min via INTRAVENOUS
  Filled 2021-02-02 (×10): qty 250

## 2021-02-02 MED ORDER — METRONIDAZOLE 500 MG/100ML IV SOLN
500.0000 mg | Freq: Three times a day (TID) | INTRAVENOUS | Status: DC
  Administered 2021-02-02 – 2021-02-07 (×15): 500 mg via INTRAVENOUS
  Filled 2021-02-02 (×15): qty 100

## 2021-02-02 MED ORDER — MUPIROCIN 2 % EX OINT: 1.0000 "application " | TOPICAL_OINTMENT | Freq: Two times a day (BID) | CUTANEOUS | Status: DC

## 2021-02-02 MED ORDER — ACETAMINOPHEN 325 MG PO TABS: 650.0000 mg | ORAL_TABLET | Freq: Four times a day (QID) | ORAL | Status: DC | PRN

## 2021-02-02 MED ORDER — DIPHENHYDRAMINE HCL 50 MG/ML IJ SOLN
INTRAMUSCULAR | Status: AC
  Filled 2021-02-02: qty 1

## 2021-02-02 MED ORDER — SODIUM CHLORIDE 0.9 % IV SOLN
80.0000 mg | Freq: Once | INTRAVENOUS | Status: DC
  Filled 2021-02-02: qty 80

## 2021-02-02 MED ORDER — FENTANYL CITRATE (PF) 100 MCG/2ML IJ SOLN
INTRAMUSCULAR | Status: AC
  Filled 2021-02-02: qty 2

## 2021-02-02 MED ORDER — DOCUSATE SODIUM 50 MG/5ML PO LIQD
100.0000 mg | Freq: Two times a day (BID) | ORAL | Status: DC
  Administered 2021-02-03 – 2021-02-13 (×11): 100 mg
  Filled 2021-02-02 (×13): qty 10

## 2021-02-02 MED ORDER — SODIUM CHLORIDE 0.9 % IV SOLN: INTRAVENOUS | Status: DC

## 2021-02-02 MED ORDER — VANCOMYCIN VARIABLE DOSE PER UNSTABLE RENAL FUNCTION (PHARMACIST DOSING): Status: DC

## 2021-02-02 MED ORDER — SODIUM BICARBONATE 8.4 % IV SOLN
INTRAVENOUS | Status: AC
  Administered 2021-02-02: 50 meq
  Filled 2021-02-02: qty 50

## 2021-02-02 MED ORDER — INSULIN ASPART 100 UNIT/ML IV SOLN: 10.0000 [IU] | INTRAVENOUS | Status: AC

## 2021-02-02 MED ORDER — PIPERACILLIN-TAZOBACTAM 3.375 G IVPB 30 MIN
3.3750 g | Freq: Once | INTRAVENOUS | Status: AC
  Administered 2021-02-02: 3.375 g via INTRAVENOUS
  Filled 2021-02-02: qty 50

## 2021-02-02 MED ORDER — NOREPINEPHRINE 4 MG/250ML-% IV SOLN
2.0000 ug/min | INTRAVENOUS | Status: DC
  Filled 2021-02-02: qty 250

## 2021-02-02 MED ORDER — ACETAMINOPHEN 650 MG RE SUPP: 650.0000 mg | Freq: Four times a day (QID) | RECTAL | Status: DC | PRN

## 2021-02-02 MED ORDER — DEXTROSE 50 % IV SOLN
INTRAVENOUS | Status: AC
  Administered 2021-02-02: 25 g via INTRAVENOUS
  Filled 2021-02-02: qty 50

## 2021-02-02 MED ORDER — VANCOMYCIN HCL 2000 MG/400ML IV SOLN
2000.0000 mg | Freq: Once | INTRAVENOUS | Status: AC
  Administered 2021-02-02: 2000 mg via INTRAVENOUS
  Filled 2021-02-02: qty 400

## 2021-02-02 MED ORDER — MIDAZOLAM HCL (PF) 10 MG/2ML IJ SOLN
INTRAMUSCULAR | Status: DC | PRN
  Administered 2021-02-02 (×3): 2 mg via INTRAVENOUS

## 2021-02-02 MED ORDER — FENTANYL CITRATE (PF) 100 MCG/2ML IJ SOLN
INTRAMUSCULAR | Status: DC | PRN
  Administered 2021-02-02 (×2): 25 ug via INTRAVENOUS

## 2021-02-02 MED ORDER — VANCOMYCIN HCL 1250 MG/250ML IV SOLN: 1250.0000 mg | INTRAVENOUS | Status: DC

## 2021-02-02 MED ORDER — FENTANYL CITRATE (PF) 100 MCG/2ML IJ SOLN
INTRAMUSCULAR | Status: AC
  Filled 2021-02-02: qty 4

## 2021-02-02 MED ORDER — SODIUM CHLORIDE 0.9 % IV SOLN
2.0000 g | Freq: Two times a day (BID) | INTRAVENOUS | Status: DC
  Administered 2021-02-02: 2 g via INTRAVENOUS
  Filled 2021-02-02 (×2): qty 2

## 2021-02-02 MED ORDER — FENTANYL 2500MCG IN NS 250ML (10MCG/ML) PREMIX INFUSION
0.0000 ug/h | INTRAVENOUS | Status: DC
  Administered 2021-02-02: 25 ug/h via INTRAVENOUS
  Filled 2021-02-02: qty 250

## 2021-02-02 MED ORDER — DEXTROSE 50 % IV SOLN: 25.0000 g | INTRAVENOUS | Status: AC

## 2021-02-02 MED ORDER — NOREPINEPHRINE 4 MG/250ML-% IV SOLN
INTRAVENOUS | Status: AC
  Administered 2021-02-02: 38 ug/min via INTRAVENOUS
  Filled 2021-02-02: qty 250

## 2021-02-02 MED ORDER — DEXMEDETOMIDINE HCL IN NACL 200 MCG/50ML IV SOLN
0.0000 ug/kg/h | INTRAVENOUS | Status: AC
  Administered 2021-02-02 – 2021-02-03 (×4): 0.4 ug/kg/h via INTRAVENOUS
  Filled 2021-02-02 (×4): qty 50

## 2021-02-02 MED ORDER — SODIUM ZIRCONIUM CYCLOSILICATE 10 G PO PACK
10.0000 g | PACK | Freq: Two times a day (BID) | ORAL | Status: DC
  Administered 2021-02-02 – 2021-02-03 (×3): 10 g via ORAL
  Filled 2021-02-02 (×4): qty 1

## 2021-02-02 MED ORDER — DEXTROSE 50 % IV SOLN
INTRAVENOUS | Status: AC
  Filled 2021-02-02: qty 50

## 2021-02-02 MED ORDER — SODIUM CHLORIDE 0.9% IV SOLUTION: Freq: Once | INTRAVENOUS | Status: AC

## 2021-02-02 MED ORDER — CHLORHEXIDINE GLUCONATE CLOTH 2 % EX PADS: 6.0000 | MEDICATED_PAD | Freq: Every day | CUTANEOUS | Status: DC

## 2021-02-02 MED ORDER — MUPIROCIN 2 % EX OINT
1.0000 "application " | TOPICAL_OINTMENT | Freq: Two times a day (BID) | CUTANEOUS | Status: AC
  Administered 2021-02-02 – 2021-02-07 (×10): 1 via NASAL
  Filled 2021-02-02: qty 22

## 2021-02-02 MED ORDER — SODIUM CHLORIDE 0.9 % IV SOLN
250.0000 mL | INTRAVENOUS | Status: DC
  Administered 2021-02-02 – 2021-02-08 (×2): 250 mL via INTRAVENOUS
  Administered 2021-02-11: 500 mL via INTRAVENOUS

## 2021-02-02 MED ORDER — SODIUM CHLORIDE 0.9 % IV BOLUS
500.0000 mL | Freq: Once | INTRAVENOUS | Status: AC
  Administered 2021-02-02: 500 mL via INTRAVENOUS

## 2021-02-02 MED ORDER — POLYETHYLENE GLYCOL 3350 17 G PO PACK
17.0000 g | PACK | Freq: Every day | ORAL | Status: DC
  Administered 2021-02-02 – 2021-02-07 (×4): 17 g
  Filled 2021-02-02 (×5): qty 1

## 2021-02-02 MED ORDER — MIDAZOLAM HCL (PF) 5 MG/ML IJ SOLN
INTRAMUSCULAR | Status: AC
  Filled 2021-02-02: qty 1

## 2021-02-02 MED ORDER — ORAL CARE MOUTH RINSE
15.0000 mL | OROMUCOSAL | Status: DC
  Administered 2021-02-02 – 2021-02-13 (×103): 15 mL via OROMUCOSAL

## 2021-02-02 MED ORDER — SODIUM CHLORIDE 0.9% IV SOLUTION
Freq: Once | INTRAVENOUS | Status: AC
Start: 2021-02-02 — End: 2021-02-02

## 2021-02-02 MED ORDER — MORPHINE SULFATE (PF) 2 MG/ML IV SOLN
2.0000 mg | INTRAVENOUS | Status: DC | PRN
  Administered 2021-02-02: 2 mg via INTRAVENOUS
  Filled 2021-02-02: qty 1

## 2021-02-02 MED ORDER — CHLORHEXIDINE GLUCONATE CLOTH 2 % EX PADS
6.0000 | MEDICATED_PAD | Freq: Every day | CUTANEOUS | Status: DC
  Administered 2021-02-02: 6 via TOPICAL

## 2021-02-02 NOTE — Progress Notes (Signed)
Discussed with Dr. Francine Graven.  Patient has been having ongoing bleeding and is now requiring pressor support.  Thus, he is not stable enough for nuclear bleeding scan.  Discussed with Dr. Levora Angel.  Recommend proceeding with bedside EGD and flexible sigmoidoscopy for further evaluation.  I called patient's son, Justin Solomon, and discussed the procedures in detail to include nature, alternatives, benefits, and risks (including but not limited to bleeding, infection, perforation).  Patient son verbalized understanding and gave verbal consent to proceed with EGD and flexible sigmoidoscopy.  Continue to monitor H&H with transfusion as needed to maintain hemoglobin greater than 7.  [facility] will follow.

## 2021-02-02 NOTE — Consult Note (Signed)
Referring Provider: [facility] Primary Care Physician:  Patrecia Pour, MD Primary Gastroenterologist:  [facility])  Reason for Consultation:  Rectal bleeding  HPI: Justin Solomon is a 70 y.o. male with history of recent surgery for spine complicated with quadriparesis and epidural hematoma s/p tracheostomy and PEG tube placement and A fib presenting for consultation of rectal bleeding.  Patient does not respond to questions/interview, thus history obtained from chart.  Patient presented to ED with rectal bleeding, was initially hypotensive with SBP in 80s but improved with fluid.  He had an episode of hematochezia upon arrival to ICU, scant brown stool but otherwise frank blood per RN.  No prior colonoscopy on file.  Patient was reportedly on Heparin for DVT prophylaxis prior to admission.  CT abdomen without contrast 5/31: Large volume of stool throughout the colon suggestive of constipation. Moderate to marked rectal distension by fecal material with mild rectal wall thickening and suggestion of mild perirectal inflammation, question proctitis or early changes of stercoral colitis  EGD with PEG placement 12/29/20  Past Medical History:  Diagnosis Date  . Acute respiratory failure (HCC)   . Atrial fibrillation (HCC)   . Essential hypertension i  . 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 PEG TUBE PLACEMENT    . TRACHEOSTOMY      Prior to Admission medications   Medication Sig Start Date End Date Taking? Authorizing Provider  acetaminophen (TYLENOL) 325 MG tablet Place 650 mg into feeding tube every 6 (six) hours as needed for mild pain, fever or headache.   Yes [provider]  Amantadine HCl 100 MG tablet Place 100 mg into feeding tube 2 (two) times daily.   Yes [provider]  amiodarone (PACERONE) 200 MG tablet Place 200 mg into feeding tube daily. 12/30/20  Yes [provider]  amLODipine (NORVASC) 10 MG tablet Place 10 mg into feeding tube daily. 08/31/15  Yes [provider]  atorvastatin (LIPITOR) 80 MG tablet Place 80 mg into feeding tube daily. 12/30/20  Yes [provider]  baclofen (LIORESAL) 5 mg TABS tablet Place 5 mg into feeding tube every 12 (twelve) hours.   Yes [provider]  Carboxymethylcellulose Sod PF 0.5 % SOLN Place 1 drop into both eyes every 6 (six) hours.   Yes [provider]  chlorhexidine (PERIDEX) 0.12 % solution Use as directed 5 mLs in the mouth or throat every 12 (twelve) hours.   Yes [provider]  famotidine (PEPCID) 20 MG tablet Place 20 mg into feeding tube every 12 (twelve) hours.   Yes [provider]  fluconazole (DIFLUCAN) 100 MG tablet Place 100 mg into feeding tube daily. 01/25/21 02/15/21 Yes [provider]  heparin 5000 UNIT/ML injection Inject 5,000 Units into the skin every 8 (eight) hours.   Yes [provider]  metoprolol tartrate (LOPRESSOR) 25 MG tablet Place 25 mg into feeding tube every 12 (twelve) hours. 12/29/20  Yes [provider]  Nutritional Supplements (FEEDING SUPPLEMENT, JEVITY 1.5 CAL,) LIQD Place 1,000 mLs into feeding tube continuous. 70ml/hour   Yes [provider]  traMADol (ULTRAM) 50 MG tablet Place 50 mg into feeding tube every 8 (eight) hours. 11/03/15  Yes [provider]  LACTATED RINGERS IV Inject 1 L into the vein See admin instructions. 1 L at 49ml/hour. Discontinue order when completed    [provider]    Scheduled Meds:  Continuous Infusions: . sodium chloride 75 mL/hr at 02/02/21 0241  . piperacillin-tazobactam (ZOSYN)  IV    . [START ON 02/03/2021] vancomycin    . vancomycin 2,000 mg (02/02/21 0724)   PRN Meds:.acetaminophen **OR** acetaminophen, morphine injection  Allergies as of 02/01/2021  . (Not on File)    Family History  Problem Relation Age of Onset  .  Diabetes Mellitus II Neg Hx     Social History   Socioeconomic History  . Marital status: Single    Spouse name: Not on file  . Number of children: Not on file  . Years of education: Not on file  . Highest education level: Not on file  Occupational History  . Not on file  Tobacco Use  . Smoking status: Former Games developer  . Smokeless tobacco: Never Used  Substance and Sexual Activity  . Alcohol use: Not Currently  . Drug use: Not Currently  . Sexual activity: Not Currently  Other Topics Concern  . Not on file  Social History Narrative  . Not on file   Social Determinants of Health   Financial Resource Strain: Not on file  Food Insecurity: Not on file  Transportation Needs: Not on file  Physical Activity: Not on file  Stress: Not on file  Social Connections: Not on file  Intimate Partner Violence: Not on file    Review of Systems: Review of Systems  Unable to perform ROS: Patient nonverbal   Physical Exam: Vital signs: Vitals:   02/02/21 0739 02/02/21 0800  BP:  96/66  Pulse:  80  Resp:  (!) 29  Temp:    SpO2: 100% 100%     Physical Exam Vitals and nursing note reviewed.  Constitutional:      General: He is awake. He is not in acute distress.    Appearance: He is ill-appearing.  HENT:     Head: Normocephalic and atraumatic.     Nose: Nose normal. No congestion.     Mouth/Throat:     Mouth: Mucous membranes are moist.     Pharynx: Oropharynx is clear.  Eyes:     General: No scleral icterus.    Extraocular Movements: Extraocular movements intact.     Comments: Conjunctival pallor  Neck:     Comments: Collar in place, +tracheostomy Cardiovascular:     Rate and Rhythm: Normal rate and regular rhythm.  Pulmonary:     Effort: Pulmonary effort is normal. No respiratory distress.  Abdominal:     General: Bowel sounds are normal. There is no distension.     Palpations: Abdomen is soft. There is no mass.     Tenderness: There is no abdominal tenderness. There  is no guarding or rebound.     Hernia: No hernia is present.     Comments: + PEG tube  Musculoskeletal:        General: No swelling or tenderness.  Skin:    General: Skin is warm and dry.  Neurological:     General: No focal deficit present.     Mental Status: He is oriented to person, place, and time. He is lethargic.  Psychiatric:        Mood and Affect: Mood normal.        Behavior: Behavior normal.     GI:  Lab Results: Recent Labs    02/01/21 2300 02/02/21 0547  WBC 22.1* <0.1*  HGB 7.5* 16.2  HCT 26.0* 49.1  PLT 290 37*   BMET Recent Labs    02/01/21  2300  NA 150*  K 5.4*  CL 116*  CO2 23  GLUCOSE 132*  BUN 87*  CREATININE 2.25*  CALCIUM 10.9*   LFT No results for input(s): PROT, ALBUMIN, AST, ALT, ALKPHOS, BILITOT, BILIDIR, IBILI in the last 72 hours. PT/INR Recent Labs    02/01/21 2300  LABPROT 16.0*  INR 1.3*     Studies/Results: CT Abdomen Pelvis Wo Contrast  Result Date: 02/01/2021 CLINICAL DATA:  Abdomen pain and rectal bleeding EXAM: CT ABDOMEN AND PELVIS WITHOUT CONTRAST TECHNIQUE: Multidetector CT imaging of the abdomen and pelvis was performed following the standard protocol without IV contrast. COMPARISON:  None. FINDINGS: Lower chest: Lung bases demonstrate small slightly loculated right pleural effusion. Partial consolidation in the right lower lobe. Mild bronchiectasis in the right lower lobe. Mild focal bronchiectasis in the posterior left lung base. Irregular nodular density measuring 18 x 13 mm. Normal cardiac size. Trace pericardial effusion. Hepatobiliary: Small gallstones. No focal hepatic abnormality or biliary dilatation Pancreas: Unremarkable. No pancreatic ductal dilatation or surrounding inflammatory changes. Spleen: Normal in size without focal abnormality. Adrenals/Urinary Tract: Adrenal glands are normal. Kidneys show no hydronephrosis. Foley catheter in the bladder Stomach/Bowel: Gastrostomy tube in the body of the stomach. No  dilated small bowel. Large amount of dense stool within the colon. Moderate to marked rectal distension by feces with mild rectal wall thickening and suggestion of minimal inflammation. Vascular/Lymphatic: Moderate aortic atherosclerosis. No aneurysm. No suspicious nodes. Reproductive: Prostate is unremarkable. Other: No free air.  Mild presacral edema and soft tissue stranding. Musculoskeletal: Right hip replacement. Severe compression deformity L2 vertebral body uncertain age. IMPRESSION: 1. Large volume of stool throughout the colon suggestive of constipation. Moderate to marked rectal distension by fecal material with mild rectal wall thickening and suggestion of mild perirectal inflammation, question proctitis or early changes of stercoral colitis 2. Small gallstones 3. Small slightly loculated right pleural effusion with partial consolidation in the right lower lobe, possible pneumonia. 4. 18 mm nodular area of consolidation or nodule within the posterior left lung base. Consider one of the following in 3 months for both low-risk and high-risk individuals: (a) repeat chest CT, (b) follow-up PET-CT, or (c) tissue sampling. This recommendation follows the consensus statement: Guidelines for Management of Incidental Pulmonary Nodules Detected on CT Images: From the Fleischner Society 2017; Radiology 2017; 284:228-243. 5. Severe compression deformity of L2 of uncertain age Electronically Signed   By: Jasmine Pang M.D.   On: 02/01/2021 23:40   DG CHEST PORT 1 VIEW  Result Date: 02/02/2021 CLINICAL DATA:  70 year old male with history of pneumonia. EXAM: PORTABLE CHEST 1 VIEW COMPARISON:  No priors. FINDINGS: Tracheostomy tube in position with tip terminating 5.9 cm above the level of the carina. Vague opacity throughout the right hemithorax, favored to represent a layering pleural effusion with associated passive atelectasis and/or consolidation in the right lower lobe. Left lung is clear. Small right pleural  effusion. No left pleural effusion. No pneumothorax. No evidence of pulmonary edema. Heart size is normal. The patient is rotated to the left on today's exam, resulting in distortion of the mediastinal contours and reduced diagnostic sensitivity and specificity for mediastinal pathology. Orthopedic fixation hardware in the lower cervical spine incompletely imaged. IMPRESSION: 1. Moderate layering right-sided pleural effusion with atelectasis and/or consolidation in the right lower lobe. Electronically Signed   By: Trudie Reed M.D.   On: 02/02/2021 06:23    Impression: Rectal bleeding: CT with concerns for proctitis vs. Stercoral colitis. -Hgb 7.5 yesterday, awaiting  repeat today as CBC today appears erroneous -INR 1.3 as of 5/31  AKI: BUN 87/ Cr 2.25  Recent surgery for spine complicated with quadriparesis and epidural hematoma s/p tracheostomy and PEG tube placement   A fib  Plan: Nuclear bleeding scan for further evaluation of ongoing bleeding.   Continue to monitor H&H with transfusion as needed to maintain Hgb >7.  [facility] will follow.    LOS: 0 days   Edrick KinsAlison Baron-Johnson  PA-C 02/02/2021, 8:50 AM  Contact #  [phone]

## 2021-02-02 NOTE — H&P (Signed)
History and Physical    Justin PoetHarry L Hege WUJ:[id number] DOB: 05/01/1951 DOA: 02/01/2021  PCP: Patrecia PourShackleford, Brian Alexander, MD  Patient coming from: [facility].  Chief Complaint: Rectal bleeding.  History obtained from ER physician previous records and patient's daughter.  HPI: Justin Solomon is a 70 y.o. male with history of recent surgery for spine complicated with quadriparesis and epidural hematoma following which patient was placed on ventilator and subsequently had tracheostomy PEG tube placement was found to have atrial fibrillation not on anticoagulation secondary epidural hematoma was eventually discharged to [facility] in [place] was found to have rectal bleeding.  Per report patient is on heparin for DVT prophylaxis.  Patient is mostly nonverbal.  Most of the history was obtained from patient's daughter.  ED Course: In the ER patient was initially hypotensive systolic in the 80s which improved with fluids.  CT abdomen pelvis done shows features concerning for possible stercoral colitis versus proctitis.  Large amount of blood was noted in the ER while cleaning the patient by the nurse.  Hemoglobin is around 7.5 which is a drop from 9.9 about a month ago in Care Everywhere.  Creatinine also has worsened from normal a month ago it is around 2.2 patient also has elevated lactic acid and WBC count of 22.1.  CT scan done also shows features concerning for pneumonia with consolidation in possible pleural effusion.  2 units of PRBC transfusion has been ordered for GI bleed and also empiric antibiotic started.  COVID test is negative.  Review of Systems: As per HPI, rest all negative.   Past Medical History:  Diagnosis Date  . Acute respiratory failure (HCC)   . Atrial fibrillation (HCC)   . Essential hypertension i  . 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 PEG TUBE PLACEMENT    . TRACHEOSTOMY       reports  that he has quit smoking. He has never used smokeless tobacco. He reports previous alcohol use. He reports previous drug use.  Not on File  Family History  Problem Relation Age of Onset  . Diabetes Mellitus II Neg Hx     Prior to Admission medications   Not on File    Physical Exam: Constitutional: Moderately built and nourished. Vitals:   02/02/21 0001 02/02/21 0031 02/02/21 0152 02/02/21 0213  BP: 110/64 106/71 109/70 102/66  Pulse: 92 94 92 91  Resp: (!) 24 (!) 24 (!) 26 (!) 28  Temp:   98.8 F (37.1 C) 98.8 F (37.1 C)  TempSrc:   Axillary Axillary  SpO2: 98% 99% 97% 99%  Weight:      Height:       Eyes: Anicteric no pallor. ENMT: No discharge from the ears eyes nose and mouth. Neck: No mass felt.  No neck rigidity.  Tracheostomy tube seen. Respiratory: No rhonchi or crepitations. Cardiovascular: S1-S2 heard. Abdomen: PEG tube seen. Musculoskeletal: No edema. Skin: No rash. Neurologic: Patient is nonverbal and has quadriparesis. Psychiatric: Patient is nonverbal.   Labs on Admission: I have personally reviewed following labs and imaging studies  CBC: Recent Labs  Lab 02/01/21 2300  WBC 22.1*  NEUTROABS 17.0*  HGB 7.5*  HCT 26.0*  MCV 89.7  PLT 290   Basic Metabolic Panel: Recent Labs  Lab 02/01/21 2300  NA 150*  K 5.4*  CL 116*  CO2 23  GLUCOSE 132*  BUN 87*  CREATININE 2.25*  CALCIUM 10.9*   GFR: Estimated Creatinine Clearance: 37.1 mL/min (A) (by C-G formula based on SCr of 2.25 mg/dL (H)). Liver Function Tests: No results for input(s): AST, ALT, ALKPHOS, BILITOT, PROT, ALBUMIN in the last 168 hours. No results for input(s): LIPASE, AMYLASE in the last 168 hours. No results for input(s): AMMONIA in the last 168 hours. Coagulation Profile: Recent Labs  Lab 02/01/21 2300  INR 1.3*   Cardiac Enzymes: No results for input(s): CKTOTAL, CKMB, CKMBINDEX, TROPONINI in the last 168 hours. BNP (last 3 results) No results for input(s):  PROBNP in the last 8760 hours. HbA1C: No results for input(s): HGBA1C in the last 72 hours. CBG: No results for input(s): GLUCAP in the last 168 hours. Lipid Profile: No results for input(s): CHOL, HDL, LDLCALC, TRIG, CHOLHDL, LDLDIRECT in the last 72 hours. Thyroid Function Tests: No results for input(s): TSH, T4TOTAL, FREET4, T3FREE, THYROIDAB in the last 72 hours. Anemia Panel: No results for input(s): VITAMINB12, FOLATE, FERRITIN, TIBC, IRON, RETICCTPCT in the last 72 hours. Urine analysis: No results found for: COLORURINE, APPEARANCEUR, LABSPEC, PHURINE, GLUCOSEU, HGBUR, BILIRUBINUR, KETONESUR, PROTEINUR, UROBILINOGEN, NITRITE, LEUKOCYTESUR Sepsis Labs: @LABRCNTIP (procalcitonin:4,lacticidven:4) ) Recent Results (from the past 240 hour(s))  Resp Panel by RT-PCR (Flu A&B, Covid) Nasopharyngeal Swab     Status: None   Collection Time: 02/02/21 12:30 AM   Specimen: Nasopharyngeal Swab; Nasopharyngeal(NP) swabs in vial transport medium  Result Value Ref Range Status   SARS Coronavirus 2 by RT PCR NEGATIVE NEGATIVE Final    Comment: (NOTE) SARS-CoV-2 target nucleic acids are NOT DETECTED.  The SARS-CoV-2 RNA is generally detectable in upper respiratory specimens during the acute phase of infection. The lowest concentration of SARS-CoV-2 viral copies this assay can detect is 138 copies/mL. A negative result does not preclude SARS-Cov-2 infection and should not be used as the sole basis for treatment or other patient management decisions. A negative result may occur with  improper specimen collection/handling, submission of specimen other than nasopharyngeal swab, presence of viral mutation(s) within the areas targeted by this assay, and inadequate number of viral copies(<138 copies/mL). A negative result must be combined with clinical observations, patient history, and epidemiological information. The expected result is Negative.  Fact Sheet for Patients:   04/04/21  Fact Sheet for Healthcare Providers:  BloggerCourse.com  This test is no t yet approved or cleared by the [employer]iousBroker.it FDA and  has been authorized for detection and/or diagnosis of SARS-CoV-2 by FDA under an Emergency Use Authorization (EUA). This EUA will remain  in effect (meaning this test can be used) for the duration of the COVID-19 declaration under Section 564(b)(1) of the Act, 21 U.S.C.section 360bbb-3(b)(1), unless the authorization is terminated  or revoked sooner.       Influenza A by PCR NEGATIVE NEGATIVE Final   Influenza B by PCR NEGATIVE NEGATIVE Final    Comment: (NOTE) The Xpert Xpress SARS-CoV-2/FLU/RSV plus assay is intended as an aid in the diagnosis of influenza from Nasopharyngeal swab specimens and should not be used as a sole basis for treatment. Nasal washings and aspirates are unacceptable for Xpert Xpress SARS-CoV-2/FLU/RSV testing.  Fact Sheet for Patients: [place]  Fact Sheet for Healthcare Providers: BloggerCourse.com  This test is not yet approved or cleared by the SeriousBroker.it FDA and has been authorized for detection and/or diagnosis of SARS-CoV-2 by FDA under an Emergency Use Authorization (EUA). This EUA will remain in effect (meaning this test can be used) for the duration of the COVID-19 declaration under Section  564(b)(1) of the Act, 21 U.S.C. section 360bbb-3(b)(1), unless the authorization is terminated or revoked.  Performed at [facility], [address] [place], [place] [address]      Radiological Exams on Admission: CT Abdomen Pelvis Wo Contrast  Result Date: 02/01/2021 CLINICAL DATA:  Abdomen pain and rectal bleeding EXAM: CT ABDOMEN AND PELVIS WITHOUT CONTRAST TECHNIQUE: Multidetector CT imaging of the abdomen and pelvis was performed following the standard protocol without IV  contrast. COMPARISON:  None. FINDINGS: Lower chest: Lung bases demonstrate small slightly loculated right pleural effusion. Partial consolidation in the right lower lobe. Mild bronchiectasis in the right lower lobe. Mild focal bronchiectasis in the posterior left lung base. Irregular nodular density measuring 18 x 13 mm. Normal cardiac size. Trace pericardial effusion. Hepatobiliary: Small gallstones. No focal hepatic abnormality or biliary dilatation Pancreas: Unremarkable. No pancreatic ductal dilatation or surrounding inflammatory changes. Spleen: Normal in size without focal abnormality. Adrenals/Urinary Tract: Adrenal glands are normal. Kidneys show no hydronephrosis. Foley catheter in the bladder Stomach/Bowel: Gastrostomy tube in the body of the stomach. No dilated small bowel. Large amount of dense stool within the colon. Moderate to marked rectal distension by feces with mild rectal wall thickening and suggestion of minimal inflammation. Vascular/Lymphatic: Moderate aortic atherosclerosis. No aneurysm. No suspicious nodes. Reproductive: Prostate is unremarkable. Other: No free air.  Mild presacral edema and soft tissue stranding. Musculoskeletal: Right hip replacement. Severe compression deformity L2 vertebral body uncertain age. IMPRESSION: 1. Large volume of stool throughout the colon suggestive of constipation. Moderate to marked rectal distension by fecal material with mild rectal wall thickening and suggestion of mild perirectal inflammation, question proctitis or early changes of stercoral colitis 2. Small gallstones 3. Small slightly loculated right pleural effusion with partial consolidation in the right lower lobe, possible pneumonia. 4. 18 mm nodular area of consolidation or nodule within the posterior left lung base. Consider one of the following in 3 months for both low-risk and high-risk individuals: (a) repeat chest CT, (b) follow-up PET-CT, or (c) tissue sampling. This recommendation follows  the consensus statement: Guidelines for Management of Incidental Pulmonary Nodules Detected on CT Images: From the Fleischner Society 2017; Radiology 2017; 284:228-243. 5. Severe compression deformity of L2 of uncertain age Electronically Signed   By: Jasmine Pang M.D.   On: 02/01/2021 23:40    EKG: Independently reviewed.  Normal sinus rhythm.  Assessment/Plan Principal Problem:   Rectal bleeding Active Problems:   Quadriparesis (HCC)   Atrial fibrillation (HCC)   ARF (acute renal failure) (HCC)   Essential hypertension   Acute blood loss anemia   Acute bleeding    1. Rectal bleeding -patient was noted to have frank blood from the rectal area with clots.  Per report patient is on heparin for DVT prophylaxis.  For now 2 units of PRBC transfusion has been ordered we will get GI consult. 2. Possible developing sepsis source could be either possible pneumonia with consolidation and pleural effusion could also be intra-abdominal with proctitis and also possible UTI.  Patient is on empiric antibiotics continue with fluids follow lactic acid levels cultures. 3. History of A. fib not on anticoagulation secondary recent epidural hematoma.  Patient is usually on amiodarone presently keeping n.p.o.  Home medication needs to be verified. 4. Acute blood loss anemia secondary to GI bleed follow CBC after transfusion. 5. Acute renal failure could be from GI bleed and hypotension.  Follow metabolic panel after transfusion. 6. History of recent cervical surgery complicated with epidural hematoma  with quadriparesis.  Given acute GI bleeding with possible developing sepsis will need close monitoring for any further worsening in inpatient status.   DVT prophylaxis: SCDs.  Avoiding anticoagulation in the setting of GI bleed. Code Status: Full code confirmed with patient's daughter. Family Communication: Patient's daughter. Disposition Plan: To be determined. Consults called: [facility] sent a message  through secure chat. Admission status: Inpatient.   Eduard Clos MD [facility] Pager [phone].  If 7PM-7AM, please contact night-coverage [email or web address] Password [email or web address]  02/02/2021, 2:16 AM

## 2021-02-02 NOTE — Procedures (Signed)
Central Venous Catheter Insertion Procedure Note  Justin Solomon  [id number]  1951/07/18  Date:02/02/21  Time:1:25 PM   Provider Performing:Willona Phariss Lacretia Nicks Mikey Bussing   Procedure: Insertion of Non-tunneled Central Venous Catheter(36556) with US guidance (96283)   Indication(s) Medication administration  Consent Unable to obtain consent due to emergent nature of procedure.  Anesthesia Topical only with 1% lidocaine   Timeout Verified patient identification, verified procedure, site/side was marked, verified correct patient position, special equipment/implants available, medications/allergies/relevant history reviewed, required imaging and test results available.  Sterile Technique Maximal sterile technique including full sterile barrier drape, hand hygiene, sterile gown, sterile gloves, mask, hair covering, sterile ultrasound probe cover (if used).  Procedure Description Area of catheter insertion was cleaned with chlorhexidine and draped in sterile fashion.  With real-time ultrasound guidance a introducer sheath was placed into the right femoral vein. Nonpulsatile blood flow and easy flushing noted in all ports.  Triple lumen catheter advanced via main port on introducer. Flushed x 3. The catheters were sutured in place and sterile dressing applied.  Complications/Tolerance None; patient tolerated the procedure well. Chest X-ray is ordered to verify placement for internal jugular or subclavian cannulation.   Chest x-ray is not ordered for femoral cannulation.  EBL Minimal  Specimen(s) None   Justin Solomon, AGACNP-BC  Pulmonary & Critical Care  See Amion for personal pager PCCM on call pager [phone] until 7pm. Please call Elink 7p-7a. [phone]  02/02/2021 1:25 PM

## 2021-02-02 NOTE — Consult Note (Addendum)
NAME:  Justin Solomon, MRN:  [id number], DOB:  11-Nov-1950, LOS: 0 ADMISSION DATE:  02/01/2021, CONSULTATION DATE: 6/1 REFERRING MD: Dr. Toniann Fail TRH, CHIEF COMPLAINT: GI bleed  History of Present Illness:   Patient is encephalopathic and/or intubated. Therefore history has been obtained from chart review.   70 year old male with complicated recent past medical history significant for admission outside hospital back in March after a fall.  He required spinal surgery which was complicated by epidural hematoma and ultimately resulting in quadriplegia.  He is also unable to be liberated from the ventilator and has since undergone tracheostomy.  He was discharged to [facility] in [place] for ongoing rehab. 5/31 he presented to [facility] ED from [facility] for evaluation of GI bleeding. [facility] at [facility] noted bright red blood with clots in his brief as he was being changed. In the ED there were clots noted in the rectal area, but no active bleeding was observed. Vitals remained stable. CT abdomen concerning for large volume of stool concerning for constipation and Marked rectal distension. Also described loculated R pleural effusion. Hemoglobin 7.5.   The patient was admitted to the hospitalist service. GI was consulted and were planning for tagged RBC study, however, the patient decompensated in the early afternoon hours of 6/1. He developed respiratory depression and near respiratory arrest. PCCM was consulted.   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   Significant Hospital Events: Including procedures, antibiotic start and stop dates in addition to other pertinent events   . 5/31 admit for GIB . 6/1 transfer to ICU/shock  Interim History / Subjective:    Objective   Blood pressure (!) 129/55, pulse 86, temperature (!) 97.5 F (36.4 C), temperature  source Axillary, resp. rate (!) 22, height 6' (1.829 m), weight 104.8 kg, SpO2 100 %.    Vent Mode: PRVC FiO2 (%):  [100 %] 100 % Set Rate:  [20 bmp-24 bmp] 24 bmp Vt Set:  [620 mL] 620 mL PEEP:  [5 cmH20] 5 cmH20 Plateau Pressure:  [21 cmH20] 21 cmH20   Intake/Output Summary (Last 24 hours) at 02/02/2021 1336 Last data filed at 02/02/2021 1318 Gross per 24 hour  Intake 2027.8 ml  Output --  Net 2027.8 ml   Filed Weights   02/01/21 1951  Weight: 104.8 kg    Examination: General: cachectic elderly appearing male in NAD HENT: trach in place. C-collar in place.  Lungs: Clear bilateral breath sounds Cardiovascular: RRR, no MRG Abdomen: PEG in place. Soft, non-tender.  Extremities: Thin, mild contracture of the upper extremity.  Neuro: unresponsive GU: foley  Labs/imaging that I havepersonally reviewed  (right click and "Reselect all SmartList Selections" daily)   CT abdomen: Large volume of stool throughout the colon suggestive of constipation. Moderate to marked rectal distension by fecal material. Question proctitis or early changes of stercoral Colitis. Small gallstones. Small loculated right pleural effusion. 39mm Lung nodule. Severe L2 compression.   Hgb 7.1 INR 1.3 WBC 24.5 Plt 296 K 6.2 Na 149 Creatinine 2.76 Mag 2.8 AST 219 ALT 341 Albumin 1.8  Resolved Hospital Problem list     Assessment & Plan:   Acute gastrointestinal hemorrhage: Presumably lower GI source considering the character of the blood, perhaps related to rectal ulcer from stercoral colitis.  - GI planning to scope this afternoon - PPI infusion - NPO - Miralax  Hemorrhagic shock: cannot  rule out sepsis with evidence on PNA/effusion on CT, but doubt this is the cause for shock.  Acute blood loss anemia - s/p 1 unit, orders to trasfuse 2 additional PRBC - norepinephrine, vasopressin for MAP goal - Serial CBC q 6 hours.   Concern for aspiration pneumonia Right sided pleural  effusion - HCAP coverage for LTACH resident cefepime, vanco, flagyl - May need effusion drained once he is more stable, will continue to eval - Follow cultures.   Acute hypoxemic respiratory failure - 4 cuffless trach exchanged for 6 cuff shiley. CXR with good placement.  - Full vent support - ABG pending  Quadriplegia: secondary epidural hematoma of C-spine in march.  Failure to thrive - Will need ongoing goals of care discussions - Re-start tube feeds when OK with GI - Consider Palliative consult depending on discussion with family.   AKI Hyperkalemia Hypernatremia - Continue D5W - bicarb given - labs are from 0900 so we need a repeat to know where we truly are.   Atrial fibrillation history - not on AC since epidural hematoma.  - amio on hold for now.   Lung nodule - Outpatient follow up   Best practice (right click and "Reselect all SmartList Selections" daily)  Diet:  NPO Pain/Anxiety/Delirium protocol (if indicated): Yes (RASS goal -1) VAP protocol (if indicated): Not indicated DVT prophylaxis: SCD GI prophylaxis: PPI Glucose control:  SSI No Central venous access:  Yes, and it is still needed Arterial line:  Yes, and it is still needed Foley:  Yes, and it is still needed Mobility:  bed rest  PT consulted: N/A Last date of multidisciplinary goals of care discussion []  Code Status:  full code Disposition: ICU  Labs   CBC: Recent Labs  Lab 02/01/21 2300 02/02/21 0547 02/02/21 0939  WBC 22.1* <0.1* 24.5*  NEUTROABS 17.0*  --  17.1*  HGB 7.5* 16.2 7.1*  HCT 26.0* 49.1 24.1*  MCV 89.7 96.8 90.6  PLT 290 37* 269    Basic Metabolic Panel: Recent Labs  Lab 02/01/21 2300 02/02/21 0939  NA 150* 149*  K 5.4* 6.2*  CL 116* 115*  CO2 23 18*  GLUCOSE 132* 92  BUN 87* 98*  CREATININE 2.25* 2.76*  CALCIUM 10.9* 10.3  MG  --  2.8*  PHOS  --  7.3*   GFR: Estimated Creatinine Clearance: 30.3 mL/min (A) (by C-G formula based on SCr of 2.76 mg/dL  (H)). Recent Labs  Lab 02/01/21 2300 02/02/21 0230 02/02/21 0547 02/02/21 0939  WBC 22.1*  --  <0.1* 24.5*  LATICACIDVEN 3.0* 3.1*  --   --     Liver Function Tests: Recent Labs  Lab 02/02/21 0939  AST 219*  ALT 341*  ALKPHOS 218*  BILITOT 1.1  PROT 7.7  ALBUMIN 1.8*   No results for input(s): LIPASE, AMYLASE in the last 168 hours. No results for input(s): AMMONIA in the last 168 hours.  ABG    Component Value Date/Time   PHART 7.004 (LL) 02/02/2021 1203   PCO2ART 47.1 02/02/2021 1203   PO2ART 120 (H) 02/02/2021 1203   HCO3 11.0 (L) 02/02/2021 1203   ACIDBASEDEF 18.4 (H) 02/02/2021 1203   O2SAT 93.3 02/02/2021 1203     Coagulation Profile: Recent Labs  Lab 02/01/21 2300  INR 1.3*    Cardiac Enzymes: No results for input(s): CKTOTAL, CKMB, CKMBINDEX, TROPONINI in the last 168 hours.  HbA1C: No results found for: HGBA1C  CBG: Recent Labs  Lab 02/02/21 0748 02/02/21 1136 02/02/21  1138 02/02/21 1210  GLUCAP 89 27* 19* 114*    Review of Systems:   Patient is encephalopathic and/or intubated. Therefore history has been obtained from chart review.    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   Surgical History:   Past Surgical History:  Procedure Laterality Date  . PEG TUBE PLACEMENT    . TRACHEOSTOMY       Social History:   reports that he has quit smoking. He has never used smokeless tobacco. He reports previous alcohol use. He reports previous drug use.   Family History:  His family history is negative for Diabetes Mellitus II.   Allergies No Known Allergies   Home Medications  Prior to Admission medications   Medication Sig Start Date End Date Taking? Authorizing Provider  acetaminophen (TYLENOL) 325 MG tablet Place 650 mg into feeding tube every 6 (six) hours as needed for mild pain, fever  or headache.   Yes [provider]  Amantadine HCl 100 MG tablet Place 100 mg into feeding tube 2 (two) times daily.   Yes [provider]  amiodarone (PACERONE) 200 MG tablet Place 200 mg into feeding tube daily. 12/30/20  Yes [provider]  amLODipine (NORVASC) 10 MG tablet Place 10 mg into feeding tube daily. 08/31/15  Yes [provider]  atorvastatin (LIPITOR) 80 MG tablet Place 80 mg into feeding tube daily. 12/30/20  Yes [provider]  baclofen (LIORESAL) 5 mg TABS tablet Place 5 mg into feeding tube every 12 (twelve) hours.   Yes [provider]  Carboxymethylcellulose Sod PF 0.5 % SOLN Place 1 drop into both eyes every 6 (six) hours.   Yes [provider]  chlorhexidine (PERIDEX) 0.12 % solution Use as directed 5 mLs in the mouth or throat every 12 (twelve) hours.   Yes [provider]  famotidine (PEPCID) 20 MG tablet Place 20 mg into feeding tube every 12 (twelve) hours.   Yes [provider]  fluconazole (DIFLUCAN) 100 MG tablet Place 100 mg into feeding tube daily. 01/25/21 02/15/21 Yes [provider]  heparin 5000 UNIT/ML injection Inject 5,000 Units into the skin every 8 (eight) hours.   Yes [provider]  metoprolol tartrate (LOPRESSOR) 25 MG tablet Place 25 mg into feeding tube every 12 (twelve) hours. 12/29/20  Yes [provider]  Nutritional Supplements (FEEDING SUPPLEMENT, JEVITY 1.5 CAL,) LIQD Place 1,000 mLs into feeding tube continuous. 28ml/hour   Yes [provider]  traMADol (ULTRAM) 50 MG tablet Place 50 mg into feeding tube every 8 (eight) hours. 11/03/15  Yes [provider]  LACTATED RINGERS IV Inject 1 L into the vein See admin instructions. 1 L at 67ml/hour. Discontinue order when completed    [provider]     Critical care time: 60 min     Joneen Roach, AGACNP-BC [place]  See Amion for personal  pager PCCM on call pager [phone] until 7pm. Please call Elink 7p-7a. [phone]  02/02/2021 3:00 PM

## 2021-02-02 NOTE — ED Notes (Signed)
Notified MD Marland Mcalpine of critical result (WBC <0.1) and rpt HGB. Awaiting his reply.

## 2021-02-02 NOTE — ED Notes (Addendum)
Pt cleaned with 2 staff assist. Pt passed bowel movement with dark red clots mixed with light brown stool. Notified MD Marland Mcalpine.

## 2021-02-02 NOTE — Progress Notes (Signed)
Notified Lab that ABG being sent for analysis. 

## 2021-02-02 NOTE — Progress Notes (Addendum)
Pharmacy Antibiotic Note  Justin Solomon is a 70 y.o. 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 Follow renal function and clinical course  Height: 6' (182.9 cm) Weight: 104.8 kg (231 lb) IBW/kg (Calculated) : 77.6  Temp (24hrs), Avg:98.5 F (36.9 C), Min:97.9 F (36.6 C), Max:98.8 F (37.1 C)  Recent Labs  Lab 02/01/21 2300 02/02/21 0230  WBC 22.1*  --   CREATININE 2.25*  --   LATICACIDVEN 3.0* 3.1*    Estimated Creatinine Clearance: 37.1 mL/min (A) (by C-G formula based on SCr of 2.25 mg/dL (H)).    Not on File  Thank you for allowing pharmacy to be a part of this patient's care.  Arley Phenix RPh 02/02/2021, 2:58 AM   Pharmacy consulted to dose vancomycin for sepsis  Plan: Vancomycin 2gm IV x 1 then 1250mg  q36h (AUC 505.9, Scr 2.25 Daily Scr while on both vanc and zosyn Follow renal function and clinical course  RPh 02/02/2021, 5:46 AM

## 2021-02-02 NOTE — Op Note (Signed)
[facility] Patient Name: Justin Solomon Procedure Date: 02/02/2021 MRN: [id number] Attending MD: Kathi Der , MD Date of Birth: [birth year] CSN: [id number] Age: 70 Admit Type: Inpatient Procedure:                Upper GI endoscopy Indications:              Active gastrointestinal bleeding Providers:                Kathi Der, MD, Sunday Corn Mbumina, Technician Referring MD:              Medicines:                Midazolam 4 mg IV, Fentanyl 50 micrograms IV Complications:            No immediate complications. Estimated Blood Loss:     Estimated blood loss was minimal. Procedure:                Pre-Anesthesia Assessment:                           - Prior to the procedure, a History and Physical                            was performed, and patient medications and                            allergies were reviewed. The patient's tolerance of                            previous anesthesia was also reviewed. The risks                            and benefits of the procedure and the sedation                            options and risks were discussed with the patient.                            All questions were answered, and informed consent                            was obtained. Prior Anticoagulants: The patient has                            taken no previous anticoagulant or antiplatelet                            agents. ASA Grade Assessment: III - A patient with                            severe systemic disease. After reviewing the risks                            and benefits, the patient was deemed in  satisfactory condition to undergo the procedure.                           After obtaining informed consent, the endoscope was                            passed under direct vision. Throughout the                            procedure, the patient's blood pressure, pulse, and                            oxygen saturations were  monitored continuously. The                            GIF-H190 ([id number]) Olympus gastroscope was                            introduced through the mouth, and advanced to the                            second part of duodenum. The upper GI endoscopy was                            accomplished without difficulty. The patient                            tolerated the procedure well. Scope In: Scope Out: Findings:      LA Grade A (one or more mucosal breaks less than 5 mm, not extending       between tops of 2 mucosal folds) esophagitis with no bleeding was found       at the gastroesophageal junction.      Scattered mild inflammation characterized by congestion (edema) and       erythema was found in the entire examined stomach.      The cardia and gastric fundus were normal on retroflexion.      There was evidence of a gastrostomy present in the gastric antrum. This       was characterized by healthy appearing mucosa.      Many non-bleeding superficial duodenal ulcers with no stigmata of       bleeding were found in the second portion of the duodenum. Impression:               - LA Grade A reflux esophagitis with no bleeding.                           - Gastritis.                           - Gastrostomy present characterized by healthy                            appearing mucosa.                           - Non-bleeding duodenal  ulcers with no stigmata of                            bleeding.                           - No specimens collected. Moderate Sedation:      Moderate (conscious) sedation was administered by the endoscopy nurse       and supervised by the endoscopist. The following parameters were       monitored: oxygen saturation, heart rate, blood pressure, and response       to care. Recommendation:           - Perform a flexible sigmoidoscopy today. Procedure Code(s):        --- Professional ---                           [phone], Esophagogastroduodenoscopy, flexible,                             transoral; diagnostic, including collection of                            specimen(s) by brushing or washing, when performed                            (separate procedure) Diagnosis Code(s):        --- Professional ---                           K21.00, Gastro-esophageal reflux disease with                            esophagitis, without bleeding                           K29.70, Gastritis, unspecified, without bleeding                           K26.9, Duodenal ulcer, unspecified as acute or                            chronic, without hemorrhage or perforation                           K92.2, Gastrointestinal hemorrhage, unspecified CPT copyright 2019 American Medical Association. All rights reserved. The codes documented in this report are preliminary and upon coder review may  be revised to meet current compliance requirements. Kathi Der, MD Kathi Der, MD 02/02/2021 3:28:17 PM Number of Addenda: 0

## 2021-02-02 NOTE — Progress Notes (Signed)
Care started prior to Midnight in the Emergency Room and patient was admitted early this AM after Midnight by Dr. Midge Minium and I am in current agreement with his Assessment and Plan. Additional changes to the plan of care have been made accordingly. The patient is a 70 year old with a recent history of spine surgery complicated with quadriparesis and epidural hematoma followed by which the patient became ventilator and tracheostomy dependent as well as PEG tube in place.  He is currently not on anticoagulation due to his epidural hematoma and eventually was discharged to [facility] in [place] and found to have rectal bleeding.  Per report he has been on DVT prophylaxis and he is mostly nonverbal.  Most of the history is obtained from the patient's daughter and in the ED he was hypotensive with systolics in the 80s.  CT of the abdomen and pelvis was done which showed concerns for stercoral colitis versus proctitis.  There is a large amount of blood noted in the ER while he is being cleaned by the nurse.  Hemoglobin was around 7.5 which dropped from 9.9 a month ago.  His BUN/creatinine also worsened and he was found to have an elevated WBC of 22.1.  CT scan also showed concerning for pneumonia with consolidation and pleural effusion.  He was typed and screened and transfused order for transfusion of 2 units of PRBCs.  Subsequently patient further decompensated and became hypotensive, bradycardic and almost apneic with saturations in the 20s.  His trach was uncapped and he started having an Ambu bag.  Rapid response was called and I went to the patient's bedside and he was bolused 500 mL, changed his fluids to D5W, and changed his antibiotic from Zosyn to cefepime.  An ABG was obtained and because patient had a high risk for further decompensation critical care was consulted and they will take over the patient and placed the patient on a vent and placed central venous catheter with low-dose Levophed.  He  will go to the critical care service and [facility] will pick the patient up when he is more stable from that standpoint.

## 2021-02-02 NOTE — Op Note (Signed)
[facility] Patient Name: Justin Solomon Procedure Date: 02/02/2021 MRN: [id number] Attending MD: Kathi Der , MD Date of Birth: 1950/09/14 CSN: [id number] Age: 70 Admit Type: Inpatient Procedure:                Flexible Sigmoidoscopy Indications:              Hematochezia, Abnormal CT of the GI tract Providers:                Kathi Der, MD, Melany Guernsey, Technician Referring MD:              Medicines:                per EGD report Complications:            No immediate complications. Estimated Blood Loss:     Estimated blood loss: none. Procedure:                Pre-Anesthesia Assessment:                           - Prior to the procedure, a History and Physical                            was performed, and patient medications and                            allergies were reviewed. The patient's tolerance of                            previous anesthesia was also reviewed. The risks                            and benefits of the procedure and the sedation                            options and risks were discussed with the patient.                            All questions were answered, and informed consent                            was obtained. Prior Anticoagulants: The patient has                            taken no previous anticoagulant or antiplatelet                            agents. ASA Grade Assessment: III - A patient with                            severe systemic disease. After reviewing the risks                            and benefits, the patient was deemed in  satisfactory condition to undergo the procedure.                           After obtaining informed consent, the scope was                            passed under direct vision. The GIF-H190 ([id number])                            Olympus gastroscope was introduced through the anus                            and advanced to the 20 cm from the anal verge. The                             flexible sigmoidoscopy was performed with moderate                            difficulty due to poor bowel prep with stool                            present. The patient tolerated the procedure well.                            The quality of the bowel preparation was poor. Scope In: 2:57:46 PM Scope Out: 3:01:06 PM Total Procedure Duration: 0 hours 3 minutes 20 seconds  Findings:      The digital rectal exam was normal.      A large amount of solid stool was found in the rectum and in the       recto-sigmoid colon, interfering with visualization.      There is no endoscopic evidence of bleeding in the rectum and in the       recto-sigmoid colon. Impression:               - Preparation of the colon was poor.                           - Stool in the rectum and in the recto-sigmoid                            colon.                           - No specimens collected. Moderate Sedation:      Moderate (conscious) sedation was administered by the endoscopy nurse       and supervised by the endoscopist. The following parameters were       monitored: oxygen saturation, heart rate, blood pressure, and response       to care. Recommendation:           Monitor H&H. Transfuse to keep hemoglobin more than                            7. If evidence of ongoing bleeding, recommend  repeating flexible sigmoidoscopy after giving him 2                            fleets enema. Procedure Code(s):        --- Professional ---                           [id number], Sigmoidoscopy, flexible; diagnostic,                            including collection of specimen(s) by brushing or                            washing, when performed (separate procedure) Diagnosis Code(s):        --- Professional ---                           K92.1, Melena (includes Hematochezia)                           R93.3, Abnormal findings on diagnostic imaging of                             other parts of digestive tract CPT copyright 2019 American Medical Association. All rights reserved. The codes documented in this report are preliminary and upon coder review may  be revised to meet current compliance requirements. Kathi Der, MD Kathi Der, MD 02/02/2021 3:35:41 PM Number of Addenda: 0

## 2021-02-02 NOTE — Progress Notes (Signed)
eLink Physician-Brief Progress Note Patient Name: Justin Solomon DOB: December 30, 1950 MRN: [id number]   Date of Service  02/02/2021  HPI/Events of Note  Patient admitted with GI bleeding and hypotension secondary to acute blood loss anemia, he received 3 units PRBC and required pressors, he has a history of atrial fibrillation with modestly rapid ventricular response (heart rate in 112 range). He is not on any rate control medications currently.  eICU Interventions  Given current hemodynamic issues will order Digoxin for rate control, definite rate control with his home regimen of oral amiodarone and Metoprolol will be resumed perhaps tomorrow depending on hemodynamic stability.        Thomasene Lot [name] 02/02/2021, 8:23 PM

## 2021-02-02 NOTE — Plan of Care (Signed)
Discussed in front of patient plan of care for the evening, pain management and blood pressure control with no evidence of learning.  Daughter called and updated her.  Patient given digoxin for A-fib c RVR 12 lead ekg from E-link MD   Problem: Education: Goal: Knowledge of General Education information will improve Description: Including pain rating scale, medication(s)/side effects and non-pharmacologic comfort measures Outcome: Progressing

## 2021-02-02 NOTE — Progress Notes (Signed)
Pharmacy Antibiotic Note  Justin Solomon is a 70 y.o. male admitted on 02/01/2021 with possible IAI, pneumonia.  Pharmacy has been consulted for Cefepime and Vancomycin dosing.  Plan: Cefepime 2g IV q12h Metronidazole per MD Vancomycin loading dose given on 6/1, further dosing per levels. Measure Vanc peak and trough as needed.  Goal AUC = 400 - 550. Follow up renal function, culture results, and clinical course.    Height: 6' (182.9 cm) Weight: 104.8 kg (231 lb) IBW/kg (Calculated) : 77.6  Temp (24hrs), Avg:98.5 F (36.9 C), Min:97.9 F (36.6 C), Max:99.2 F (37.3 C)  Recent Labs  Lab 02/01/21 2300 02/02/21 0230 02/02/21 0547 02/02/21 0939  WBC 22.1*  --  <0.1* 24.5*  CREATININE 2.25*  --   --  2.76*  LATICACIDVEN 3.0* 3.1*  --   --     Estimated Creatinine Clearance: 30.3 mL/min (A) (by C-G formula based on SCr of 2.76 mg/dL (H)).    No Known Allergies  Antimicrobials this admission: 6/1 Zosyn >> 6/1 6/1 Vanc >>  6/1 cefepime >>  6/1 Metronidazole >>   Dose adjustments this admission:   Microbiology results: 6/1 COVID, Influenza: neg 6/1 MRSA PCR: positive  Thank you for allowing pharmacy to be a part of this patient's care.  Lynann Beaver PharmD, BCPS Clinical Pharmacist [facility] main pharmacy [phone] 02/02/2021 12:21 PM

## 2021-02-02 NOTE — Brief Op Note (Signed)
02/01/2021 - 02/02/2021  3:35 PM  PATIENT:  Justin Solomon  70 y.o. male  PRE-OPERATIVE DIAGNOSIS:  rectal bleeding  POST-OPERATIVE DIAGNOSIS:  EGD: Ulcers - nonbleeding Flex: Stool  PROCEDURE:  Procedure(s): FLEXIBLE SIGMOIDOSCOPY (N/A) ESOPHAGOGASTRODUODENOSCOPY (EGD) (N/A)  SURGEON:  Surgeon(s) and Role:    * Gesenia Bantz, MD - Primary  Findings ---------- -EGD showed mild LA grade a esophagitis, gastritis and few nonbleeding duodenal ulcers. -Flexible sigmoidoscopy showed poor prep, large amount of solid stool and no evidence of active bleeding.  Recommendations ------------------------ -Continue supportive care -Return H&H.  Transfuse if hemoglobin less than 7 -Recommend repeat flexible sigmoidoscopy tomorrow with 2 fleets enema if evidence of ongoing bleeding -Meanwhile, if patient has ongoing bleeding recommend GI bleeding scan  -Findings and management options discussed with patient's son.  Kathi Der MD, FACP 02/02/2021, 3:37 PM  Contact #  [phone]

## 2021-02-02 NOTE — Procedures (Signed)
Arterial Catheter Insertion Procedure Note  Justin Solomon  [id number]  1950/10/23  Date:02/02/21  Time:1:27 PM    Provider Performing: Duayne Cal    Procedure: Insertion of Arterial Line (00511) with US guidance (02111)   Indication(s) Blood pressure monitoring and/or need for frequent ABGs  Consent Unable to obtain consent due to emergent nature of procedure.  Anesthesia None   Time Out Verified patient identification, verified procedure, site/side was marked, verified correct patient position, special equipment/implants available, medications/allergies/relevant history reviewed, required imaging and test results available.   Sterile Technique Maximal sterile technique including full sterile barrier drape, hand hygiene, sterile gown, sterile gloves, mask, hair covering, sterile ultrasound probe cover (if used).   Procedure Description Area of catheter insertion was cleaned with chlorhexidine and draped in sterile fashion. With real-time ultrasound guidance an arterial catheter was placed into the right femoral artery.  Appropriate arterial tracings confirmed on monitor.     Complications/Tolerance None; patient tolerated the procedure well.   EBL Minimal   Specimen(s) None   Justin Solomon, AGACNP-BC [facility]  See Amion for personal pager PCCM on call pager [phone] until 7pm. Please call Elink 7p-7a. [phone]  02/02/2021 1:27 PM

## 2021-02-02 NOTE — ED Notes (Signed)
Patient was turned on to his side. Very large amount of blood and blood clots were visible from rectum. MD notified

## 2021-02-03 ENCOUNTER — Encounter (HOSPITAL_COMMUNITY): Payer: Self-pay | Admitting: Gastroenterology

## 2021-02-03 DIAGNOSIS — L8990 Pressure ulcer of unspecified site, unspecified stage: Secondary | ICD-10-CM | POA: Insufficient documentation

## 2021-02-03 DIAGNOSIS — K625 Hemorrhage of anus and rectum: Secondary | ICD-10-CM | POA: Diagnosis not present

## 2021-02-03 DIAGNOSIS — Z7189 Other specified counseling: Secondary | ICD-10-CM

## 2021-02-03 DIAGNOSIS — J189 Pneumonia, unspecified organism: Secondary | ICD-10-CM

## 2021-02-03 DIAGNOSIS — Z515 Encounter for palliative care: Secondary | ICD-10-CM

## 2021-02-03 LAB — POCT I-STAT 7, (LYTES, BLD GAS, ICA,H+H)
Acid-base deficit: 5 mmol/L — ABNORMAL HIGH (ref 0.0–2.0)
Bicarbonate: 19.8 mmol/L — ABNORMAL LOW (ref 20.0–28.0)
Calcium, Ion: 1.22 mmol/L (ref 1.15–1.40)
HCT: 23 % — ABNORMAL LOW (ref 39.0–52.0)
Hemoglobin: 7.8 g/dL — ABNORMAL LOW (ref 13.0–17.0)
O2 Saturation: 98 %
Potassium: 5.7 mmol/L — ABNORMAL HIGH (ref 3.5–5.1)
Sodium: 152 mmol/L — ABNORMAL HIGH (ref 135–145)
TCO2: 21 mmol/L — ABNORMAL LOW (ref 22–32)
pCO2 arterial: 35.8 mmHg (ref 32.0–48.0)
pH, Arterial: 7.351 (ref 7.350–7.450)
pO2, Arterial: 103 mmHg (ref 83.0–108.0)

## 2021-02-03 LAB — BLOOD GAS, ARTERIAL
Acid-base deficit: 9.5 mmol/L — ABNORMAL HIGH (ref 0.0–2.0)
Acid-base deficit: 6.8 mmol/L — ABNORMAL HIGH (ref 0.0–2.0)
Bicarbonate: 18.2 mmol/L — ABNORMAL LOW (ref 20.0–28.0)
Bicarbonate: 17.6 mmol/L — ABNORMAL LOW (ref 20.0–28.0)
FIO2: 70
O2 Saturation: 17.7 %
O2 Saturation: 98.6 %
Patient temperature: 37
Patient temperature: 37
pCO2 arterial: 51.6 mmHg — ABNORMAL HIGH (ref 32.0–48.0)
pCO2 arterial: 33.1 mmHg (ref 32.0–48.0)
pH, Arterial: 7.173 — CL (ref 7.350–7.450)
pH, Arterial: 7.346 — ABNORMAL LOW (ref 7.350–7.450)
pO2, Arterial: <31 mmHg — CL (ref 83.0–108.0)
pO2, Arterial: 130 mmHg — ABNORMAL HIGH (ref 83.0–108.0)

## 2021-02-03 LAB — LACTIC ACID, PLASMA
Lactic Acid, Venous: 5.6 mmol/L (ref 0.5–1.9)
Lactic Acid, Venous: 6.1 mmol/L (ref 0.5–1.9)
Lactic Acid, Venous: 4 mmol/L (ref 0.5–1.9)
Lactic Acid, Venous: 4.5 mmol/L (ref 0.5–1.9)

## 2021-02-03 LAB — COMPREHENSIVE METABOLIC PANEL
ALT: 774 U/L — ABNORMAL HIGH (ref 0–44)
AST: 926 U/L — ABNORMAL HIGH (ref 15–41)
Albumin: 1.8 g/dL — ABNORMAL LOW (ref 3.5–5.0)
Alkaline Phosphatase: 214 U/L — ABNORMAL HIGH (ref 38–126)
Anion gap: 13 (ref 5–15)
BUN: 93 mg/dL — ABNORMAL HIGH (ref 8–23)
CO2: 20 mmol/L — ABNORMAL LOW (ref 22–32)
Calcium: 9.6 mg/dL (ref 8.9–10.3)
Chloride: 114 mmol/L — ABNORMAL HIGH (ref 98–111)
Creatinine, Ser: 3.19 mg/dL — ABNORMAL HIGH (ref 0.61–1.24)
GFR, Estimated: 20 mL/min — ABNORMAL LOW (ref >60)
Glucose, Bld: 149 mg/dL — ABNORMAL HIGH (ref 70–99)
Potassium: 6.8 mmol/L (ref 3.5–5.1)
Sodium: 147 mmol/L — ABNORMAL HIGH (ref 135–145)
Total Bilirubin: 1 mg/dL (ref 0.3–1.2)
Total Protein: 7.4 g/dL (ref 6.5–8.1)

## 2021-02-03 LAB — CBC
HCT: 28 % — ABNORMAL LOW (ref 39.0–52.0)
HCT: 25.8 % — ABNORMAL LOW (ref 39.0–52.0)
HCT: 26.7 % — ABNORMAL LOW (ref 39.0–52.0)
Hemoglobin: 8.4 g/dL — ABNORMAL LOW (ref 13.0–17.0)
Hemoglobin: 7.8 g/dL — ABNORMAL LOW (ref 13.0–17.0)
Hemoglobin: 8.4 g/dL — ABNORMAL LOW (ref 13.0–17.0)
MCH: 27 pg (ref 26.0–34.0)
MCH: 27.3 pg (ref 26.0–34.0)
MCH: 27.2 pg (ref 26.0–34.0)
MCHC: 30 g/dL (ref 30.0–36.0)
MCHC: 30.2 g/dL (ref 30.0–36.0)
MCHC: 31.5 g/dL (ref 30.0–36.0)
MCV: 90 fL (ref 80.0–100.0)
MCV: 90.2 fL (ref 80.0–100.0)
MCV: 86.4 fL (ref 80.0–100.0)
Platelets: 237 10*3/uL (ref 150–400)
Platelets: 223 10*3/uL (ref 150–400)
Platelets: 231 10*3/uL (ref 150–400)
RBC: 3.11 MIL/uL — ABNORMAL LOW (ref 4.22–5.81)
RBC: 2.86 MIL/uL — ABNORMAL LOW (ref 4.22–5.81)
RBC: 3.09 MIL/uL — ABNORMAL LOW (ref 4.22–5.81)
RDW: 17.2 % — ABNORMAL HIGH (ref 11.5–15.5)
RDW: 17 % — ABNORMAL HIGH (ref 11.5–15.5)
RDW: 17.2 % — ABNORMAL HIGH (ref 11.5–15.5)
WBC: 22.3 10*3/uL — ABNORMAL HIGH (ref 4.0–10.5)
WBC: 21.7 10*3/uL — ABNORMAL HIGH (ref 4.0–10.5)
WBC: 26 10*3/uL — ABNORMAL HIGH (ref 4.0–10.5)
nRBC: 2.2 % — ABNORMAL HIGH (ref 0.0–0.2)
nRBC: 4.2 % — ABNORMAL HIGH (ref 0.0–0.2)
nRBC: 2.9 % — ABNORMAL HIGH (ref 0.0–0.2)

## 2021-02-03 LAB — BASIC METABOLIC PANEL
Anion gap: 12 (ref 5–15)
BUN: 101 mg/dL — ABNORMAL HIGH (ref 8–23)
CO2: 20 mmol/L — ABNORMAL LOW (ref 22–32)
Calcium: 8.8 mg/dL — ABNORMAL LOW (ref 8.9–10.3)
Chloride: 116 mmol/L — ABNORMAL HIGH (ref 98–111)
Creatinine, Ser: 2.87 mg/dL — ABNORMAL HIGH (ref 0.61–1.24)
GFR, Estimated: 23 mL/min — ABNORMAL LOW (ref >60)
Glucose, Bld: 103 mg/dL — ABNORMAL HIGH (ref 70–99)
Potassium: 5.3 mmol/L — ABNORMAL HIGH (ref 3.5–5.1)
Sodium: 148 mmol/L — ABNORMAL HIGH (ref 135–145)

## 2021-02-03 LAB — GLUCOSE, CAPILLARY
Glucose-Capillary: 27 mg/dL — CL (ref 70–99)
Glucose-Capillary: 175 mg/dL — ABNORMAL HIGH (ref 70–99)
Glucose-Capillary: 169 mg/dL — ABNORMAL HIGH (ref 70–99)
Glucose-Capillary: 78 mg/dL (ref 70–99)
Glucose-Capillary: 89 mg/dL (ref 70–99)

## 2021-02-03 LAB — HEPATITIS PANEL, ACUTE
HCV Ab: NONREACTIVE
Hep A IgM: NONREACTIVE
Hep B C IgM: NONREACTIVE
Hepatitis B Surface Ag: NONREACTIVE

## 2021-02-03 LAB — POTASSIUM
Potassium: 6.8 mmol/L (ref 3.5–5.1)
Potassium: 6.5 mmol/L (ref 3.5–5.1)
Potassium: 5.6 mmol/L — ABNORMAL HIGH (ref 3.5–5.1)
Potassium: 5.4 mmol/L — ABNORMAL HIGH (ref 3.5–5.1)

## 2021-02-03 LAB — MAGNESIUM: Magnesium: 2.7 mg/dL — ABNORMAL HIGH (ref 1.7–2.4)

## 2021-02-03 LAB — PHOSPHORUS: Phosphorus: 9.3 mg/dL — ABNORMAL HIGH (ref 2.5–4.6)

## 2021-02-03 MED ORDER — SODIUM CHLORIDE 0.9 % IV SOLN
2.0000 g | INTRAVENOUS | Status: DC
  Administered 2021-02-03 – 2021-02-04 (×2): 2 g via INTRAVENOUS
  Filled 2021-02-03 (×2): qty 2

## 2021-02-03 MED ORDER — SODIUM CHLORIDE 0.9 % IV BOLUS
1000.0000 mL | Freq: Once | INTRAVENOUS | Status: AC
  Administered 2021-02-03: 1000 mL via INTRAVENOUS

## 2021-02-03 MED ORDER — SODIUM BICARBONATE 8.4 % IV SOLN
100.0000 meq | Freq: Once | INTRAVENOUS | Status: AC
  Administered 2021-02-03: 100 meq via INTRAVENOUS
  Filled 2021-02-03: qty 50

## 2021-02-03 MED ORDER — SODIUM ZIRCONIUM CYCLOSILICATE 10 G PO PACK
10.0000 g | PACK | Freq: Once | ORAL | Status: AC
  Administered 2021-02-03: 10 g
  Filled 2021-02-03: qty 1

## 2021-02-03 MED ORDER — CHLORHEXIDINE GLUCONATE CLOTH 2 % EX PADS
6.0000 | MEDICATED_PAD | Freq: Every day | CUTANEOUS | Status: AC
  Administered 2021-02-03 – 2021-02-07 (×5): 6 via TOPICAL

## 2021-02-03 MED ORDER — DEXTROSE 50 % IV SOLN
1.0000 | Freq: Once | INTRAVENOUS | Status: AC
  Administered 2021-02-03: 50 mL via INTRAVENOUS
  Filled 2021-02-03: qty 50

## 2021-02-03 MED ORDER — INSULIN ASPART 100 UNIT/ML IV SOLN
5.0000 [IU] | Freq: Once | INTRAVENOUS | Status: AC
  Administered 2021-02-03: 5 [IU] via INTRAVENOUS

## 2021-02-03 NOTE — Progress Notes (Signed)
Brief Pharmacy Antibiotic Note:   Pt being treated for possible IAI, pna:  Due to increase in Scr, changed cefepime to 2gm IV q24h Metronidazole per MD Vancomycin loading dose given on 6/1, further dosing per levels. Follow up renal function, culture results, and clinical course.  Arley Phenix RPh 02/03/2021, 2:21 AM

## 2021-02-03 NOTE — Progress Notes (Signed)
[facility] Progress Note  Justin Solomon 70 y.o. [birth year]  CC:  Rectal bleeding  Subjective: Patient unresponsive and unable to provide any subjective data.  Per RN, one brown BM mixed with red blood today. No BMs or rectal bleeding overnight.  ROS : Review of Systems  Unable to perform ROS: Patient unresponsive   Objective: Vital signs in last 24 hours: Vitals:   02/03/21 0801 02/03/21 0919  BP:    Pulse:    Resp:    Temp: 99.3 F (37.4 C)   SpO2:  98%    Physical Exam:  General:  Unresponsive; ill-appearing  Head:  Normocephalic, without obvious abnormality, atraumatic  Lungs:   Mechanically ventilated  Heart:  Regular rate and rhythm, S1, S2 normal  Abdomen:   Soft, non-tender (no grimace to palpation), non-distended, normoactive bowel sounds; +PEG tube    Lab Results: Recent Labs    02/02/21 0939 02/02/21 1726 02/03/21 0243 02/03/21 0610  NA 149* 149* 147*  --   K 6.2* 5.9* 6.8* 6.8*  CL 115* 113* 114*  --   CO2 18* 18* 20*  --   GLUCOSE 92 236* 149*  --   BUN 98* 101* 93*  --   CREATININE 2.76* 3.24* 3.19*  --   CALCIUM 10.3 9.9 9.6  --   MG 2.8*  --  2.7*  --   PHOS 7.3*  --  9.3*  --    Recent Labs    02/02/21 1726 02/03/21 0243  AST 499* 926*  ALT 565* 774*  ALKPHOS 237* 214*  BILITOT 1.5* 1.0  PROT 7.7 7.4  ALBUMIN 1.9* 1.8*   Recent Labs    02/01/21 2300 02/02/21 0547 02/02/21 0939 02/02/21 1726 02/02/21 2151 02/03/21 0243  WBC 22.1*   < > 24.5*   < > 22.7* 22.3*  NEUTROABS 17.0*  --  17.1*  --   --   --   HGB 7.5*   < > 7.1*   < > 9.0* 8.4*  HCT 26.0*   < > 24.1*   < > 29.5* 28.0*  MCV 89.7   < > 90.6   < > 89.7 90.0  PLT 290   < > 269   < > 242 237   < > = values in this interval not displayed.   Recent Labs    02/01/21 2300 02/02/21 1726  LABPROT 16.0* 19.9*  INR 1.3* 1.7*      Assessment: Rectal bleeding: CT with concerns for proctitis vs. Stercoral colitis.  Flexible sigmoidoscopy and EGD unrevealing  for source of bleeding, though non-bleeding duodenal ulcers were present. -Hgb 8.4, stable -INR 1.3 as of 5/31  Transaminitis, likely shock liver in the setting of GI bleeding/hemorrhage.  Doubt acute hepatitis, though will order acute hepatitis panel to rule this out, given elevations in LFTs.  No liver abnormality noted on recent CT. -T. Bili 1.0/ AST 926/ ALT 774/ ALP 214  AKI: BUN 93/ Cr 3.19  Recent surgery for spine complicated with quadriparesis and epidural hematoma s/p tracheostomy and PEG tube placement   A fib  Unresponsiveness  Hyperkalemia (6.8)  Plan: Patient not currently stable for nuclear bleeding scan as he continues to require pressors, thus if rebleeding occurs, recommend repeat bedside flex sig with enema prep.  Continue to trend CBC with transfusion as needed to maintain Hgb >7.  Advised RN to contact us if significant bleeding or destabilizing bleeding occurs.  Continue supportive care.  Continue Protonix BID given duodenal ulcers.  [facility] will follow.  Edrick Kins PA-C 02/03/2021, 9:24 AM  Contact #  [phone]

## 2021-02-03 NOTE — Progress Notes (Signed)
Initial Nutrition Assessment  DOCUMENTATION CODES:   Not applicable  INTERVENTION:  - once TF initiation is feasible, recommend Nepro @ 30 ml/hr to advance by 10 ml every 4 hours to reach goal rate of 50 ml/hr with 45 ml Prosource TF BID.  - at goal rate, this regimen will provide 2240 kcal, 119 grams protein, and 872 ml free water. - free water flush per CCM.   NUTRITION DIAGNOSIS:   Inadequate oral intake related to inability to eat as evidenced by NPO status.  GOAL:   Patient will meet greater than or equal to 90% of their needs  MONITOR:   Labs,Weight trends,Skin,Other (Comment) (ability to initiate TF)  REASON FOR ASSESSMENT:   Ventilator  ASSESSMENT:   70 year old male with medical history of afib, HTN, hospitalization in 11/2020 after a fall requiring spinal surgery which was complicated in epidural hematoma and resulted in quadriplegia. He was unable to be liberalized from the vent and required trach and PEG. Patient resides at [place]. He presented to the ED on 5/31 from [facility] for evaluation of GIB. CT abdomen concerning for large volume of stool concerning for constipation and rectal distension. Also noted to have R pleural effusion.  Patient intubated via trach with PEG-J in place. No family/visitors present at this time. Patient discussed in rounds this AM; plan to hold off on TF at this time.   Weight today documented as 191 lb and PTA the most recently documented weight was 210 lb at [facility] on 12/28/20. This indicates 19 lb weight loss (9% body weight) in the past 1.5 months; significant for time frame.   Per notes: - acute GI hemorrhage s/p EGD and flex sig on 6/1 - hemorrhagic shock - ABLA s/p 3 units PRBCs - AKI - quadriplegia - lung nodule with plan for outpatient follow-up   Patient is currently intubated on ventilator support MV: 15.1 L/min Temp (24hrs), Avg:98.5 F (36.9 C), Min:97.3 F (36.3 C), Max:101 F (38.3 C) Propofol: none   Labs  reviewed; CBGs: 169, 78, 89 mg/dl, Na: 152 mmol/l, K: 6.5 mmol/l, Cl: 114 mmol/l, BUN: 93 mg/dl, creatinine: 3.19 mg/dl, Phos: 9.3 mg/dl, Mg: 2.7 mg/dl, Alk Phos and LFTs elevated, GFR: 20. Medications reviewed; 100 mg colace BID, 40 mg IV protonix BID, 17 g miralax/day, 10 g lokelma BID. IVF; D5 @ 75 ml/hr (306 kcal/24 hrs).     NUTRITION - FOCUSED PHYSICAL EXAM:  completed; no muscle or fat depletions, no noted edema at this time.  Diet Order:   Diet Order            Diet NPO time specified  Diet effective now                 EDUCATION NEEDS:   No education needs have been identified at this time  Skin:  Skin Assessment: Skin Integrity Issues: Skin Integrity Issues:: Stage II Stage II: R vertebral column  Last BM:  6/2 (type 4 x1)  Height:   Ht Readings from Last 1 Encounters:  02/01/21 6' (1.829 m)    Weight:   Wt Readings from Last 1 Encounters:  02/03/21 86.9 kg     Estimated Nutritional Needs:  Kcal:  2248 kcal Protein:  105-120 grams Fluid:  >/= 2 L/day       Jarome Matin, MS, RD, LDN, CNSC Inpatient Clinical Dietitian RD pager # available in [facility]  After hours/weekend pager # available in [facility]

## 2021-02-03 NOTE — Progress Notes (Addendum)
Critical K+ 6.8 from 5.9 last blood draw called to E-link RN.  Patient has had no blood stools on the shift so far

## 2021-02-03 NOTE — Progress Notes (Signed)
NAME:  Justin Solomon, MRN:  [id number], DOB:  [birth year], LOS: 1 ADMISSION DATE:  02/01/2021, CONSULTATION DATE: 6/1 REFERRING MD: Dr. Hal Hope TRH, CHIEF COMPLAINT: GI bleed  History of Present Illness:   Patient is encephalopathic and/or intubated. Therefore history has been obtained from chart review.   70 year old male with complicated recent past medical history significant for admission outside hospital back in March after a fall.  He required spinal surgery which was complicated by epidural hematoma and ultimately resulting in quadriplegia.  He is also unable to be liberated from the ventilator and has since undergone tracheostomy.  He was discharged to [facility] in [place] for ongoing rehab. 5/31 he presented to [facility] long ED from [facility] for evaluation of GI bleeding. Rn at [facility] noted bright red blood with clots in his brief as he was being changed. In the ED there were clots noted in the rectal area, but no active bleeding was observed. Vitals remained stable. CT abdomen concerning for large volume of stool concerning for constipation and Marked rectal distension. Also described loculated R pleural effusion. Hemoglobin 7.5.   The patient was admitted to the hospitalist service. GI was consulted and were planning for tagged RBC study, however, the patient decompensated in the early afternoon hours of 6/1. He developed respiratory depression and near respiratory arrest. PCCM was consulted.   Pertinent  Medical History   has a past medical history of Acute respiratory failure ([place]), Atrial fibrillation ([place]), Essential hypertension (i), and Paralysis due to acute stroke ([place]).   Significant Hospital Events: Including procedures, antibiotic start and stop dates in addition to other pertinent events   . 5/31 admit for GIB . 6/1 transfer to ICU/shock. EGD and Flex sig performed by GI.  Interim History / Subjective:   No acute events overnight. No further transfusion requirements.    Gi scoped the patient at the bedside yesterday. No active bleeding lesion noted.   Patient with good stool output, no major bleeding noted.   Objective   Blood pressure (!) 125/95, pulse 76, temperature 99.3 F (37.4 C), temperature source Axillary, resp. rate (!) 28, height 6' (1.829 m), weight 86.9 kg, SpO2 98 %.    Vent Mode: PRVC FiO2 (%):  [50 %-100 %] 60 % Set Rate:  [20 bmp-28 bmp] 28 bmp Vt Set:  [620 mL] 620 mL PEEP:  [5 cmH20] 5 cmH20 Plateau Pressure:  [17 cmH20-26 cmH20] 17 cmH20   Intake/Output Summary (Last 24 hours) at 02/03/2021 1043 Last data filed at 02/03/2021 0602 Gross per 24 hour  Intake 3745.94 ml  Output 1475 ml  Net 2270.94 ml   Filed Weights   02/01/21 1951 02/03/21 0400  Weight: 104.8 kg 86.9 kg    Examination: General: cachectic elderly appearing male in NAD HENT: trach in place. C-collar in place.  Lungs: Clear bilateral breath sounds. No wheezing. Cardiovascular: RRR, no MRG Abdomen: PEG in place. Soft, non-tender.  Extremities: Thin, mild contracture of the upper extremity.  Neuro: unresponsive GU: foley  Labs/imaging that I have personally reviewed  (right click and "Reselect all SmartList Selections" daily)   CXR images 6/1 reviewed, trach in place. Right lower lobe airspace disease.     CT abdomen 5/31 large volume stool throughout the colon, concern for stercolitis. Small loculated right pleural effusion with right lower lobe consolidation.    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 S/p EGD and flex sig on 6/1. - PPI infusion - NPO - Miralax - GI following  Hemorrhagic shock: cannot rule out sepsis with evidence on PNA/effusion on  CT. Acute blood loss anemia Lactic Acidosis - s/p 3 units PRBCs - norepinephrine, vasopressin for MAP goal 15mHg - Serial CBC q 6 hours.  - Transfuse to maintain hemoglobin >7 - Give 1L NS bolus this AM  AKI Hyperkalemia Hypernatremia - Continue D5W - lokelma BID - Fluids as above for AKI  Elevated Liver Enzymes Possibly shock liver - will continue to monitor  Concern for aspiration pneumonia Right sided pleural effusion - HCAP coverage for LTACH resident cefepime, vanco, flagyl - May need effusion drained once he is more stable, will continue to eval - Follow cultures.   Acute hypoxemic respiratory failure - 4 cuffless trach exchanged for 6 cuff shiley. CXR with good placement.  - Full vent support  Quadriplegia: secondary epidural hematoma of C-spine in march.  Failure to thrive - Will need ongoing goals of care discussions, palliative care consulted - Re-start tube feeds when OK with GI  Atrial fibrillation history - not on AC since epidural hematoma.  - amio on hold for now.   Lung nodule - Outpatient follow up   Best practice (right click and "Reselect all SmartList Selections" daily)  Diet:  NPO Pain/Anxiety/Delirium protocol (if indicated): Yes (RASS goal -1) VAP protocol (if indicated): Not indicated DVT prophylaxis: SCD GI prophylaxis: PPI Glucose control:  SSI No Central venous access:  Yes, and it is still needed Arterial line:  Yes, and it is still needed Foley:  Yes, and it is still needed Mobility:  bed rest  PT consulted: N/A Last date of multidisciplinary goals of care discussion '[]'  Code Status:  full code Disposition: ICU  Labs   CBC: Recent Labs  Lab 02/01/21 2300 02/02/21 0547 02/02/21 0939 02/02/21 1726 02/02/21 2151 02/03/21 0243 02/03/21 1031  WBC 22.1*   < > 24.5* 29.0* 22.7* 22.3* PENDING  NEUTROABS 17.0*  --  17.1*  --   --   --   --   HGB 7.5*   < > 7.1* 9.1* 9.0* 8.4* 7.8*  HCT 26.0*   < > 24.1* 29.3* 29.5* 28.0*  25.8*  MCV 89.7   < > 90.6 88.8 89.7 90.0 90.2  PLT 290   < > 269 280 242 237 223   < > = values in this interval not displayed.    Basic Metabolic Panel: Recent Labs  Lab 02/01/21 2300 02/02/21 0939 02/02/21 1726 02/03/21 0243 02/03/21 0610 02/03/21 0843  NA 150* 149* 149* 147*  --   --   K 5.4* 6.2* 5.9* 6.8* 6.8* 6.5*  CL 116* 115* 113* 114*  --   --   CO2 23 18* 18* 20*  --   --   GLUCOSE 132* 92 236* 149*  --   --   BUN 87* 98* 101* 93*  --   --   CREATININE 2.25* 2.76* 3.24* 3.19*  --   --   CALCIUM 10.9* 10.3 9.9 9.6  --   --   MG  --  2.8*  --  2.7*  --   --   PHOS  --  7.3*  --  9.3*  --   --  GFR: Estimated Creatinine Clearance: 23 mL/min (A) (by C-G formula based on SCr of 3.19 mg/dL (H)). Recent Labs  Lab 02/01/21 2300 02/02/21 0230 02/02/21 0547 02/02/21 1726 02/02/21 2151 02/03/21 0243 02/03/21 0513 02/03/21 1031  WBC 22.1*  --    < > 29.0* 22.7* 22.3*  --  PENDING  LATICACIDVEN 3.0* 3.1*  --   --   --   --  5.6*  --    < > = values in this interval not displayed.    Liver Function Tests: Recent Labs  Lab 02/02/21 0939 02/02/21 1726 02/03/21 0243  AST 219* 499* 926*  ALT 341* 565* 774*  ALKPHOS 218* 237* 214*  BILITOT 1.1 1.5* 1.0  PROT 7.7 7.7 7.4  ALBUMIN 1.8* 1.9* 1.8*   No results for input(s): LIPASE, AMYLASE in the last 168 hours. No results for input(s): AMMONIA in the last 168 hours.  ABG    Component Value Date/Time   PHART 7.173 (LL) 02/03/2021 0445   PCO2ART 51.6 (H) 02/03/2021 0445   PO2ART <31.0 (LL) 02/03/2021 0445   HCO3 18.2 (L) 02/03/2021 0445   ACIDBASEDEF 9.5 (H) 02/03/2021 0445   O2SAT 17.7 02/03/2021 0445     Coagulation Profile: Recent Labs  Lab 02/01/21 2300 02/02/21 1726  INR 1.3* 1.7*    Cardiac Enzymes: No results for input(s): CKTOTAL, CKMB, CKMBINDEX, TROPONINI in the last 168 hours.  HbA1C: No results found for: HGBA1C  CBG: Recent Labs  Lab 02/02/21 1757 02/02/21 1941 02/02/21 2319  02/03/21 0359 02/03/21 0737  GLUCAP 158* 175* 153* 169* 78    Critical care time: 45 min     Freda Jackson, MD [facility] Office: [phone]   See Amion for personal pager PCCM on call pager [phone] until 7pm. Please call Elink 7p-7a. [phone]

## 2021-02-03 NOTE — Progress Notes (Addendum)
eLink Physician-Brief Progress Note Patient Name: Justin Solomon DOB: 03/07/1951 MRN: [id number]   Date of Service  02/03/2021  HPI/Events of Note  PH 7.13, K+ 6.8, PCO2 51  eICU Interventions  Hyperkalemia treatment protocol ordered, sodium bicarbonate 2 gm iv bolus, respiratory rate on the ventilator increased to 28,  Repeat ABG at 6:15 a.m. Repeat Lactic acid  ordered.        Justin Solomon [name] 02/03/2021, 5:05 AM

## 2021-02-03 NOTE — Progress Notes (Signed)
Notified Lab that ABG being sent for analysis. 

## 2021-02-03 NOTE — Progress Notes (Signed)
ABG: Result PH 7.35 CO2 36 PO2 103 Bicarb 19.8  NA- 152 K 5.7 iCA 1.22 HCT 23 Hb 7.8

## 2021-02-04 DIAGNOSIS — K625 Hemorrhage of anus and rectum: Secondary | ICD-10-CM | POA: Diagnosis not present

## 2021-02-04 LAB — CBC
HCT: 24.7 % — ABNORMAL LOW (ref 39.0–52.0)
HCT: 25.2 % — ABNORMAL LOW (ref 39.0–52.0)
Hemoglobin: 7.8 g/dL — ABNORMAL LOW (ref 13.0–17.0)
Hemoglobin: 8 g/dL — ABNORMAL LOW (ref 13.0–17.0)
MCH: 27.1 pg (ref 26.0–34.0)
MCH: 27.9 pg (ref 26.0–34.0)
MCHC: 31.6 g/dL (ref 30.0–36.0)
MCHC: 31.7 g/dL (ref 30.0–36.0)
MCV: 85.8 fL (ref 80.0–100.0)
MCV: 87.8 fL (ref 80.0–100.0)
Platelets: 189 10*3/uL (ref 150–400)
Platelets: 200 10*3/uL (ref 150–400)
RBC: 2.87 MIL/uL — ABNORMAL LOW (ref 4.22–5.81)
RBC: 2.88 MIL/uL — ABNORMAL LOW (ref 4.22–5.81)
RDW: 17 % — ABNORMAL HIGH (ref 11.5–15.5)
RDW: 17.1 % — ABNORMAL HIGH (ref 11.5–15.5)
WBC: 17.8 10*3/uL — ABNORMAL HIGH (ref 4.0–10.5)
WBC: 21.6 10*3/uL — ABNORMAL HIGH (ref 4.0–10.5)
nRBC: 2.1 % — ABNORMAL HIGH (ref 0.0–0.2)
nRBC: 2.4 % — ABNORMAL HIGH (ref 0.0–0.2)

## 2021-02-04 LAB — COMPREHENSIVE METABOLIC PANEL
ALT: 850 U/L — ABNORMAL HIGH (ref 0–44)
AST: 921 U/L — ABNORMAL HIGH (ref 15–41)
Albumin: 1.6 g/dL — ABNORMAL LOW (ref 3.5–5.0)
Alkaline Phosphatase: 196 U/L — ABNORMAL HIGH (ref 38–126)
Anion gap: 12 (ref 5–15)
BUN: 109 mg/dL — ABNORMAL HIGH (ref 8–23)
CO2: 19 mmol/L — ABNORMAL LOW (ref 22–32)
Calcium: 8.5 mg/dL — ABNORMAL LOW (ref 8.9–10.3)
Chloride: 113 mmol/L — ABNORMAL HIGH (ref 98–111)
Creatinine, Ser: 2.8 mg/dL — ABNORMAL HIGH (ref 0.61–1.24)
GFR, Estimated: 23 mL/min — ABNORMAL LOW (ref >60)
Glucose, Bld: 106 mg/dL — ABNORMAL HIGH (ref 70–99)
Potassium: 4.2 mmol/L (ref 3.5–5.1)
Sodium: 144 mmol/L (ref 135–145)
Total Bilirubin: 1 mg/dL (ref 0.3–1.2)
Total Protein: 6.4 g/dL — ABNORMAL LOW (ref 6.5–8.1)

## 2021-02-04 LAB — GLUCOSE, CAPILLARY
Glucose-Capillary: 107 mg/dL — ABNORMAL HIGH (ref 70–99)
Glucose-Capillary: 71 mg/dL (ref 70–99)
Glucose-Capillary: 78 mg/dL (ref 70–99)
Glucose-Capillary: 82 mg/dL (ref 70–99)
Glucose-Capillary: 83 mg/dL (ref 70–99)
Glucose-Capillary: 86 mg/dL (ref 70–99)
Glucose-Capillary: 96 mg/dL (ref 70–99)
Glucose-Capillary: 98 mg/dL (ref 70–99)
Glucose-Capillary: 98 mg/dL (ref 70–99)

## 2021-02-04 LAB — PHOSPHORUS: Phosphorus: 6.5 mg/dL — ABNORMAL HIGH (ref 2.5–4.6)

## 2021-02-04 LAB — POTASSIUM
Potassium: 4.7 mmol/L (ref 3.5–5.1)
Potassium: 4.9 mmol/L (ref 3.5–5.1)

## 2021-02-04 LAB — CREATININE, SERUM
Creatinine, Ser: 2.94 mg/dL — ABNORMAL HIGH (ref 0.61–1.24)
GFR, Estimated: 22 mL/min — ABNORMAL LOW (ref >60)

## 2021-02-04 LAB — VANCOMYCIN, RANDOM: Vancomycin Rm: 14

## 2021-02-04 LAB — MAGNESIUM: Magnesium: 2.4 mg/dL (ref 1.7–2.4)

## 2021-02-04 MED ORDER — SODIUM CHLORIDE 0.9 % IV BOLUS
1000.0000 mL | Freq: Once | INTRAVENOUS | Status: AC
  Administered 2021-02-04: 1000 mL via INTRAVENOUS

## 2021-02-04 MED ORDER — VANCOMYCIN HCL 1500 MG/300ML IV SOLN
1500.0000 mg | INTRAVENOUS | Status: DC
  Administered 2021-02-04: 1500 mg via INTRAVENOUS
  Filled 2021-02-04 (×2): qty 300

## 2021-02-04 MED ORDER — PROSOURCE TF PO LIQD
45.0000 mL | Freq: Two times a day (BID) | ORAL | Status: DC
  Administered 2021-02-04 – 2021-02-15 (×23): 45 mL
  Filled 2021-02-04 (×21): qty 45

## 2021-02-04 MED ORDER — METOPROLOL TARTRATE 5 MG/5ML IV SOLN
5.0000 mg | INTRAVENOUS | Status: DC | PRN
  Administered 2021-02-04 – 2021-02-06 (×4): 5 mg via INTRAVENOUS
  Filled 2021-02-04 (×4): qty 5

## 2021-02-04 MED ORDER — NEPRO/CARBSTEADY PO LIQD
1000.0000 mL | ORAL | Status: AC
Start: 2021-02-04 — End: 2021-02-04
  Administered 2021-02-04: 1000 mL
  Filled 2021-02-04: qty 1000

## 2021-02-04 MED ORDER — ALBUMIN HUMAN 25 % IV SOLN
25.0000 g | Freq: Four times a day (QID) | INTRAVENOUS | Status: AC
  Administered 2021-02-04 – 2021-02-05 (×3): 25 g via INTRAVENOUS
  Filled 2021-02-04 (×3): qty 100

## 2021-02-04 MED ORDER — OSMOLITE 1.5 CAL PO LIQD
1000.0000 mL | ORAL | Status: DC
  Administered 2021-02-04 – 2021-02-15 (×12): 1000 mL
  Filled 2021-02-04 (×11): qty 1000

## 2021-02-04 NOTE — Progress Notes (Signed)
1136 ON 6/3  Pt was placed on 40% ATC. Pt's vitals are good. RT will continue to monitor

## 2021-02-04 NOTE — Consult Note (Signed)
Palliative care consult  Reason for consult: Goals of care in light of quadriplegia at baseline complicated by shock, concern for aspiration, and GI bleed  Palliative care consult received.  Chart reviewed including personal review of pertinent labs and imaging.  Briefly, Justin Solomon is a 70 year old male with complicated recent history significant for fall in March that resulted in hospitalization at outside facility.  He required spinal surgery which was further complicated by epidural hematoma that ultimately resulted in quadriplegia.  He became ventilator dependent and underwent tracheostomy and PEG tube placement.  He was discharged to [facility] here in [place] for continued rehab.  He presented to [facility] from [facility] for evaluation for GI bleeding.  He was noted to have clots on arrival but no active bleeding was observed.  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  He underwent EGD and flex sig on 6/1.  No active bleeding was noted but also indicated that there was poor prep.  Rectal bleeding seems to be improved and GI has signed off.  His mental status remains poor and he is minimally interactive.  Palliative consulted for goals of care.  I saw and examined Justin Solomon today.  He was lying in bed in no distress but is unresponsive. Blood pressure 124/64, pulse 89, temperature 98.5 F (36.9 C), temperature source Axillary, resp. rate 19, height 6' (1.829 m), weight 86.9 kg, SpO2 99 %.  General: Cachectic elderly appearing male in no distress, trach and c-collar Lungs: Clear bilateral with no wheezing Cardiovascular: Regular no murmur rubs or gallops Abdomen: PEG in place, soft, positive bowel sounds Extremity: Thin with contractures noted Neuro: Unresponsive  I called and was able to reach patient's son, Justin Solomon.    Justin Solomon tells me that his father is a very strong and independent man.  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  He has 3 children and family is very important to him.  We discussed his clinical course over the past couple of months and the significant changes family has been working to deal with since his surgery and development of hematoma with resulting quadriparesis.  Justin Solomon tells me that has been difficult having his father away from their home as they found placement here in [place].  They come to visit him twice per week and have been seeing some improvement in his overall status prior to this admission.  Justin Solomon reports that his father is interactive at baseline and has actually improved some in his ability to move his arms.  He feels that the doctors have been doing a good job explaining things to him this admission and understands that his father is critically ill.  We discussed multiple comorbidities and concern with GI bleed.  Also discussed change in mental status and the fact that he is largely unresponsive at this time.  We discussed long-term goals of care including concepts specific to code status and care plan this hospitalization hospitalization discussed.  We discussed difference between a aggressive medical intervention path and a palliative, comfort focused care path.    -Full code/full scope treatment -Family is still processing the significant changes that have occurred since he developed quadriparesis earlier this year.  They are invested in plan to continue with any and all aggressive interventions in hopes that he can make some functional recovery.  Son reports they were seeing some  improvement and were optimistic prior to this hospitalization.  Goal is to work to get back to [facility] but they would eventually like to work on getting him to another facility closer to their home. -Family is agreeable to another meeting in person to further discuss when they come the visit with him on Saturday.  We are  planning for a family meeting with his 3 children at 1 PM.  Questions and concerns addressed.   PMT will continue to support holistically.  Start time: 1530 End time: 1645 Total time: 75 minutes  Greater than 50%  of this time was spent counseling and coordinating care related to the above assessment and plan.  Romie Minus, MD [facility] Palliative Medicine Team [phone]

## 2021-02-04 NOTE — Progress Notes (Signed)
Nutrition Follow-up  DOCUMENTATION CODES:   Not applicable  INTERVENTION:  - will adjust TF regimen: Osmolite 1.5 @ 20 ml/hr to advance by 10 ml every 4 hours to reach goal rate of 60 ml/hr with Prosource TF BID.  - at goal rate, this regimen will provide 2240 kcal, 112 grams protein, and 1097 ml free water. - free water flush per CCM. - will monitor K, Mg, and Phos closely to determine if TF formula needs changed.   NUTRITION DIAGNOSIS:   Inadequate oral intake related to inability to eat as evidenced by NPO status. -ongoing  GOAL:   Patient will meet greater than or equal to 90% of their needs -unmet at this time  MONITOR:   Vent status,TF tolerance,Labs,Weight trends,Skin  REASON FOR ASSESSMENT:   Consult Enteral/tube feeding initiation and management  ASSESSMENT:   70 year old male with medical history of afib, HTN, hospitalization in 11/2020 after a fall requiring spinal surgery which was complicated in epidural hematoma and resulted in quadriplegia. He was unable to be liberalized from the vent and required trach and PEG. Patient resides at [place] [place]. He presented to the ED on 5/31 from [facility] for evaluation of GIB. CT abdomen concerning for large volume of stool concerning for constipation and rectal distension. Also noted to have R pleural effusion.  Patient now on trach collar. PEG-J in place. He was started on Nepro @ 10 ml/hr today at 1035 with order to increase by 10 ml every 4 hours to goal rate of 30 ml/hr.   Discussed in rounds this AM; tolerating TF without issue.   No family/visitors present at this time. Palliative Care is following and plan is for in-person family meeting tomorrow (6/4) at 1300.  Renal labs and electrolytes improving overall. Will make adjustments to TF regimen for this reason. Previously recommended Nepro d/t severe hyperkalemia and hyperphosphatemia and mild hypermagnesemia.   No new weight today. Mild pitting edema to LUE documented in  the edema section of flow sheet.     Labs reviewed; CBGs: 83, 82, 86 mg/dl, Cl: 113 mmol/l, BUN: 109 mg/dl, creatinine: 2.8 mg/dl, Ca: 8.5 mg/dl, Phos: 6.5 mg/dl, Alk Phos and LFTs remain significantly elevated, GFR: 23 ml/min. K and Mg now WDL.  Medications reviewed; 100 mg colace BID, 40 mg IV protonix BID, 17 g miralax/day. IVF; D5 @ 75 ml/hr (306 kcal/24 hrs).   Diet Order:   Diet Order            Diet NPO time specified  Diet effective now                 EDUCATION NEEDS:   No education needs have been identified at this time  Skin:  Skin Assessment: Skin Integrity Issues: Skin Integrity Issues:: Stage II Stage II: R vertebral column  Last BM:  6/2 (type 4 x1; type 7 x2)  Height:   Ht Readings from Last 1 Encounters:  02/01/21 6' (1.829 m)    Weight:   Wt Readings from Last 1 Encounters:  02/03/21 86.9 kg     Estimated Nutritional Needs:  Kcal:  2248 kcal Protein:  105-120 grams Fluid:  >/= 2 L/day      Jarome Matin, MS, RD, LDN, CNSC Inpatient Clinical Dietitian RD pager # available in [place]  After hours/weekend pager # available in [facility]

## 2021-02-04 NOTE — Progress Notes (Signed)
NAME:  Justin Solomon, MRN:  [id number], DOB:  05-19-1951, LOS: 2 ADMISSION DATE:  02/01/2021, CONSULTATION DATE: 6/1 REFERRING MD: Dr. Hal Hope TRH, CHIEF COMPLAINT: GI bleed  History of Present Illness:   Patient is encephalopathic and/or intubated. Therefore history has been obtained from chart review.   70 year old male with complicated recent past medical history significant for admission outside hospital back in March after a fall.  He required spinal surgery which was complicated by epidural hematoma and ultimately resulting in quadriplegia.  He is also unable to be liberated from the ventilator and has since undergone tracheostomy.  He was discharged to [facility] in [place] for ongoing rehab. 5/31 he presented to [facility] long ED from [facility] for evaluation of GI bleeding. Rn at [facility] noted bright red blood with clots in his brief as he was being changed. In the ED there were clots noted in the rectal area, but no active bleeding was observed. Vitals remained stable. CT abdomen concerning for large volume of stool concerning for constipation and Marked rectal distension. Also described loculated R pleural effusion. Hemoglobin 7.5.   The patient was admitted to the hospitalist service. GI was consulted and were planning for tagged RBC study, however, the patient decompensated in the early afternoon hours of 6/1. He developed respiratory depression and near respiratory arrest. PCCM was consulted.   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   Significant Hospital Events: Including procedures, antibiotic start and stop dates in addition to other pertinent events   . 5/31 admit for GIB . 6/1 transfer to ICU/shock. EGD and Flex sig performed by GI. Marland Kitchen 6/2 no further transfusions  Interim History / Subjective:   No acute events overnight. No  further transfusion requirements.   Patient with good stool output, no major bleeding noted.   On SBT this AM.   Objective   Blood pressure 124/64, pulse 89, temperature 98.5 F (36.9 C), temperature source Axillary, resp. rate 19, height 6' (1.829 m), weight 86.9 kg, SpO2 99 %.    Vent Mode: PSV;CPAP FiO2 (%):  [30 %-55 %] 30 % Set Rate:  [24 bmp] 24 bmp Vt Set:  [620 mL] 620 mL PEEP:  [5 cmH20] 5 cmH20 Pressure Support:  [10 cmH20] 10 cmH20 Plateau Pressure:  [11 cmH20-20 cmH20] 18 cmH20   Intake/Output Summary (Last 24 hours) at 02/04/2021 1032 Last data filed at 02/04/2021 0655 Gross per 24 hour  Intake 3409.49 ml  Output 1750 ml  Net 1659.49 ml   Filed Weights   02/01/21 1951 02/03/21 0400  Weight: 104.8 kg 86.9 kg    Examination: General: cachectic elderly appearing male in NAD HENT: trach in place. C-collar in place.  Lungs: Clear bilateral breath sounds. No wheezing. Cardiovascular: RRR, no MRG Abdomen: PEG in place. Soft, non-tender.  Extremities: Thin, mild contracture of the upper extremity.  Neuro: unresponsive GU: foley  Labs/imaging that I have personally reviewed  (right click and "Reselect all SmartList Selections" daily)   CXR images 6/1 reviewed, trach in place. Right lower lobe airspace disease.     CT abdomen 5/31 large volume stool throughout the colon, concern for stercolitis. Small loculated right pleural effusion with right lower lobe consolidation.    H/H 7.8/24.7  Cr 2.8 today from 3.19 yesterday K 6.5  AST/ALT 921/850, alk phos 196  Lactic Acid 4.5  Resolved Hospital Problem list  Assessment & Plan:   Hemorrhagic shock: cannot rule out sepsis with evidence on PNA/effusion on CT. Acute blood loss anemia Lactic Acidosis - s/p 3 units PRBCs - norepinephrine, vasopressin for MAP goal 63mHg - Daily CBCs now - Transfuse to maintain hemoglobin >7 - PRN boluses  Acute gastrointestinal hemorrhage: Presumably lower GI source  considering the character of the blood, perhaps related to rectal ulcer from stercoral colitis. S/p EGD and flex sig on 6/1. - PPI infusion - Miralax - GI following  Acute hypoxemic respiratory failure - 4 cuffless trach exchanged for 6 cuff shiley. CXR with good placement.  - Full vent support - SBT this AM, goal to get to trach collar today  AKI Hyperkalemia Hypernatremia - Continue D5W - Stop lokelma, potassium improved - Fluids as above for AKI  Elevated Liver Enzymes Likely shock liver - will continue to monitor  Concern for aspiration pneumonia Right sided pleural effusion - HCAP coverage for LTACH resident cefepime, vanco, flagyl - to complete 7 day course - Follow cultures.   Quadriplegia: secondary epidural hematoma of C-spine in march.  Failure to thrive - Will need ongoing goals of care discussions, palliative care consulted - Start tube feeds today  Atrial fibrillation history - not on AC since epidural hematoma.  - amio on hold for now.   Lung nodule - Outpatient follow up   Best practice (right click and "Reselect all SmartList Selections" daily)  Diet:  Tube Feed  Pain/Anxiety/Delirium protocol (if indicated): Yes (RASS goal 0) VAP protocol (if indicated): Yes DVT prophylaxis: SCD GI prophylaxis: PPI Glucose control:  SSI No Central venous access:  Yes, and it is still needed Arterial line:  Yes, and it is no longer needed Foley:  Yes, and it is still needed Mobility:  bed rest  PT consulted: N/A Last date of multidisciplinary goals of care discussion [spoke with son on the phone on 6/1, confirmed full code status] Code Status:  full code Disposition: ICU  Labs   CBC: Recent Labs  Lab 02/01/21 2300 02/02/21 0547 02/02/21 0939 02/02/21 1726 02/03/21 0243 02/03/21 0655 02/03/21 1031 02/03/21 1624 02/04/21 0037 02/04/21 0814  WBC 22.1*   < > 24.5*   < > 22.3*  --  21.7* 26.0* 21.6* 17.8*  NEUTROABS 17.0*  --  17.1*  --   --   --   --    --   --   --   HGB 7.5*   < > 7.1*   < > 8.4* 7.8* 7.8* 8.4* 8.0* 7.8*  HCT 26.0*   < > 24.1*   < > 28.0* 23.0* 25.8* 26.7* 25.2* 24.7*  MCV 89.7   < > 90.6   < > 90.0  --  90.2 86.4 87.8 85.8  PLT 290   < > 269   < > 237  --  223 231 200 189   < > = values in this interval not displayed.    Basic Metabolic Panel: Recent Labs  Lab 02/02/21 0939 02/02/21 1726 02/03/21 0243 02/03/21 0610 02/03/21 0655 02/03/21 0843 02/03/21 1624 02/03/21 2007 02/04/21 0037 02/04/21 0453 02/04/21 0814  NA 149* 149* 147*  --  152*  --  148*  --   --   --  144  K 6.2* 5.9* 6.8*   < > 5.7*   < > 5.3* 5.4* 4.9 4.7 4.2  CL 115* 113* 114*  --   --   --  116*  --   --   --  113*  CO2 18* 18* 20*  --   --   --  20*  --   --   --  19*  GLUCOSE 92 236* 149*  --   --   --  103*  --   --   --  106*  BUN 98* 101* 93*  --   --   --  101*  --   --   --  109*  CREATININE 2.76* 3.24* 3.19*  --   --   --  2.87*  --   --  2.94* 2.80*  CALCIUM 10.3 9.9 9.6  --   --   --  8.8*  --   --   --  8.5*  MG 2.8*  --  2.7*  --   --   --   --   --   --   --  2.4  PHOS 7.3*  --  9.3*  --   --   --   --   --   --   --  6.5*   < > = values in this interval not displayed.   GFR: Estimated Creatinine Clearance: 26.2 mL/min (A) (by C-G formula based on SCr of 2.8 mg/dL (H)). Recent Labs  Lab 02/03/21 0513 02/03/21 1031 02/03/21 1624 02/03/21 2007 02/04/21 0037 02/04/21 0814  WBC  --  21.7* 26.0*  --  21.6* 17.8*  LATICACIDVEN 5.6* 6.1* 4.0* 4.5*  --   --     Liver Function Tests: Recent Labs  Lab 02/02/21 0939 02/02/21 1726 02/03/21 0243 02/04/21 0814  AST 219* 499* 926* 921*  ALT 341* 565* 774* 850*  ALKPHOS 218* 237* 214* 196*  BILITOT 1.1 1.5* 1.0 1.0  PROT 7.7 7.7 7.4 6.4*  ALBUMIN 1.8* 1.9* 1.8* 1.6*   No results for input(s): LIPASE, AMYLASE in the last 168 hours. No results for input(s): AMMONIA in the last 168 hours.  ABG    Component Value Date/Time   PHART 7.346 (L) 02/03/2021 1620   PCO2ART  33.1 02/03/2021 1620   PO2ART 130 (H) 02/03/2021 1620   HCO3 17.6 (L) 02/03/2021 1620   TCO2 21 (L) 02/03/2021 0655   ACIDBASEDEF 6.8 (H) 02/03/2021 1620   O2SAT 98.6 02/03/2021 1620     Coagulation Profile: Recent Labs  Lab 02/01/21 2300 02/02/21 1726  INR 1.3* 1.7*    Cardiac Enzymes: No results for input(s): CKTOTAL, CKMB, CKMBINDEX, TROPONINI in the last 168 hours.  HbA1C: No results found for: HGBA1C  CBG: Recent Labs  Lab 02/03/21 1523 02/03/21 1926 02/03/21 2339 02/04/21 0316 02/04/21 0800  GLUCAP 78 96 98 83 82    Critical care time: 40 min     Freda Jackson, MD [facility] Office: [phone]   See Amion for personal pager PCCM on call pager [phone] until 7pm. Please call Elink 7p-7a. [phone]

## 2021-02-04 NOTE — Progress Notes (Signed)
Pharmacy Antibiotic Note  Justin Solomon is a 70 y.o. male admitted on 02/01/2021 with possible IAI, pneumonia.  Pharmacy has been consulted for Cefepime and Vancomycin dosing.  02/04/21 9:28 AM  - vancomycin random level= 14 - SCr 2.8 stable  Plan: Continue cefepime 2 g iv q 24 hours Metronidazole per MD Will order vancomycin 1500 mg iv q 48 hours Daily SCr Address plans for antibiotics daily Follow up renal function, culture results, and clinical course.    Height: 6' (182.9 cm) Weight: 86.9 kg (191 lb 9.3 oz) IBW/kg (Calculated) : 77.6  Temp (24hrs), Avg:99.2 F (37.3 C), Min:98.5 F (36.9 C), Max:99.9 F (37.7 C)  Recent Labs  Lab 02/02/21 0230 02/02/21 0547 02/02/21 1726 02/02/21 2151 02/03/21 0243 02/03/21 0513 02/03/21 1031 02/03/21 1624 02/03/21 2007 02/04/21 0037 02/04/21 0453 02/04/21 0814 02/04/21 0835  WBC  --    < > 29.0*   < > 22.3*  --  21.7* 26.0*  --  21.6*  --  17.8*  --   CREATININE  --    < > 3.24*  --  3.19*  --   --  2.87*  --   --  2.94* 2.80*  --   LATICACIDVEN 3.1*  --   --   --   --  5.6* 6.1* 4.0* 4.5*  --   --   --   --   VANCORANDOM  --   --   --   --   --   --   --   --   --   --   --   --  14   < > = values in this interval not displayed.    Estimated Creatinine Clearance: 26.2 mL/min (A) (by C-G formula based on SCr of 2.8 mg/dL (H)).    No Known Allergies  Antimicrobials this admission: 6/1 Zosyn >> 6/1 6/1 Vanc >>  6/1 cefepime >>  6/1 Metronidazole >>   Dose adjustments this admission:   Microbiology results: 6/1 COVID, Influenza: neg 6/1 MRSA PCR: positive resp cx:   Thank you for allowing pharmacy to be a part of this patient's care.  Luisa Hart D  02/04/2021 9:28 AM

## 2021-02-04 NOTE — Progress Notes (Signed)
Daily Progress Note   Patient Name: Justin Solomon       Date: 02/04/2021 DOB: [birth year]  Age: 70 y.o. MRN#: [id number] Attending Physician: Freddi Starr, MD Primary Care Physician: Aaron Edelman, MD Admit Date: 02/01/2021  Reason for Consultation/Follow-up: Establishing goals of care  Subjective: I saw and examined Justin Solomon today and discussed with bedside care team.  He remains somnolent and does not interact with me.  Length of Stay: 2  Current Medications: Scheduled Meds:  . chlorhexidine gluconate (MEDLINE KIT)  15 mL Mouth Rinse BID  . Chlorhexidine Gluconate Cloth  6 each Topical Q0600  . docusate  100 mg Per Tube BID  . feeding supplement (PROSource TF)  45 mL Per Tube BID  . mouth rinse  15 mL Mouth Rinse 10 times per day  . mupirocin ointment  1 application Nasal BID  . pantoprazole (PROTONIX) IV  40 mg Intravenous Q12H  . polyethylene glycol  17 g Per Tube Daily    Continuous Infusions: . sodium chloride Stopped (02/03/21 1204)  . sodium chloride 20 mL/hr at 02/03/21 0347  . albumin human    . ceFEPime (MAXIPIME) IV Stopped (02/03/21 1819)  . dexmedetomidine (PRECEDEX) IV infusion Stopped (02/03/21 0811)  . dextrose 75 mL/hr at 02/04/21 1354  . feeding supplement (OSMOLITE 1.5 CAL)    . metronidazole Stopped (02/04/21 1115)  . norepinephrine (LEVOPHED) Adult infusion 2 mcg/min (02/04/21 1354)  . vancomycin Stopped (02/04/21 1325)    PRN Meds: acetaminophen **OR** acetaminophen, metoprolol tartrate  Physical Exam   General: Cachectic elderly appearing male in no distress, trach and c-collar Lungs: Clear bilateral with no wheezing Cardiovascular: Regular no murmur rubs or gallops Abdomen: PEG in place, soft, positive bowel  sounds Extremity: Thin with contractures noted Neuro: Unresponsive      Vital Signs: BP (!) 84/54   Pulse 66   Temp 97.8 F (36.6 C) (Axillary)   Resp 16   Ht 6' (1.829 m)   Wt 86.9 kg   SpO2 95%   BMI 25.98 kg/m  SpO2: SpO2: 95 % O2 Device: O2 Device: Tracheostomy Collar O2 Flow Rate: O2 Flow Rate (L/min): 8 L/min  Intake/output summary:   Intake/Output Summary (Last 24 hours) at 02/04/2021 1456 Last data filed at 02/04/2021 1354 Gross per 24 hour  Intake 4375.52  ml  Output 1000 ml  Net 3375.52 ml   LBM: Last BM Date: 02/04/21 Baseline Weight: Weight: 104.8 kg Most recent weight: Weight: 86.9 kg       Palliative Assessment/Data:    Flowsheet Rows   Flowsheet Row Most Recent Value  Intake Tab   Referral Department Hospitalist  Unit at Time of Referral ICU  Palliative Care Primary Diagnosis Other (Comment)  [GI]  Date Notified 02/03/21  Palliative Care Type New Palliative care  Reason for referral Clarify Goals of Care  Date of Admission 02/01/21  Date first seen by Palliative Care 02/03/21  # of days Palliative referral response time 0 Day(s)  # of days IP prior to Palliative referral 2  Clinical Assessment   Palliative Performance Scale Score 10%  Psychosocial & Spiritual Assessment   Palliative Care Outcomes   Patient/Family meeting held? Yes  Who was at the meeting? Son via phone  Palliative Care Outcomes Clarified goals of care      Patient Active Problem List   Diagnosis Date Noted  . Pressure injury of skin 02/03/2021  . Rectal bleeding 02/02/2021  . Quadriparesis ([place]) 02/02/2021  . Atrial fibrillation ([place]) 02/02/2021  . ARF (acute renal failure) ([place]) 02/02/2021  . Essential hypertension 02/02/2021  . Acute blood loss anemia 02/02/2021  . Acute bleeding 02/02/2021  . Hemorrhagic shock [facility])     Palliative Care Assessment & Plan   Patient Profile: 70 year old with complicated history including quadriplegia, trach, and PEG who was admitted  for GI bleeding  Recommendations/Plan:  Full code/full scope treatment  Continue current care.  Patient remains unresponsive today.  Plan for family meeting with his 3 children tomorrow at around 1 PM.  Goals of Care and Additional Recommendations:  Limitations on Scope of Treatment: Full Scope Treatment  Code Status:    Code Status Orders  (From admission, onward)         Start     Ordered   02/02/21 0216  Full code  Continuous        02/02/21 0216        Code Status History    This patient has a current code status but no historical code status.   Advance Care Planning Activity       Prognosis: Guarded  Discharge Planning:  To Be Determined  Thank you for allowing the Palliative Medicine Team to assist in the care of this patient.   Total Time 20 Prolonged Time Billed No      Greater than 50%  of this time was spent counseling and coordinating care related to the above assessment and plan.  Micheline Rough, MD  Please contact Palliative Medicine Team phone at [phone] for questions and concerns.

## 2021-02-04 NOTE — Progress Notes (Signed)
PCCM Update:  Spoke with patient's daughter at the bedside. They raised concern for a bump on the patient's right posterior scalp. On examination it is mobile and part of the scalp, there is a part of the C-collar just below this knot that is not covered with a foam padding. Will add additional padding to this area and monitor for resolution.   Per the daughter, patient was moving his upper and lower extremities after he left [facility] and went to [facility]. While at [facility] he developed a functional and cognitive decline.   We will give him time to clear narcotics and other sedatives due to his renal and hepatic function. If not improvement in mental status will consider further workup at that time.   Will consult PT for passive range of motion therapy.   Melody Comas, MD [place] [facility] Office: [phone]   See Amion for personal pager PCCM on call pager [phone] until 7pm. Please call Elink 7p-7a. [phone]

## 2021-02-04 NOTE — Progress Notes (Signed)
Palliative Care Brief Note  Consult received.  I saw and examined Justin Solomon and spoke with his son via phone.  Family desires full code/full scope treatment.  His 3 children are coming to visit on Saturday and we are planning for an in-person family meeting on Saturday at 1PM.  Full note to follow.  Romie Minus, MD [facility] Palliative Medicine Team [phone]

## 2021-02-04 NOTE — TOC Initial Note (Signed)
Transition of Care [facility]) - Initial/Assessment Note    Patient Details  Name: Justin Solomon MRN: [id number] Date of Birth: August 28, 1951  [facility]) CM/SW Contact:    Golda Acre, RN Phone Number: 02/04/2021, 8:46 AM  Clinical Narrative:                 70 year old male with complicated recent past medical history significant for admission outside hospital back in March after a fall.  He required spinal surgery which was complicated by epidural hematoma and ultimately resulting in quadriplegia.  He is also unable to be liberated from the ventilator and has since undergone tracheostomy.  He was discharged to [facility] in [place] for ongoing rehab. 5/31 he presented to [facility] long ED from [facility] for evaluation of GI bleeding. Rn at [facility] noted bright red blood with clots in his brief as he was being changed. In the ED there were clots noted in the rectal area, but no active bleeding was observed. Vitals remained stable. CT abdomen concerning for large volume of stool concerning for constipation and Marked rectal distension. Also described loculated R pleural effusion. Hemoglobin 7.5.   The patient was admitted to the hospitalist service. GI was consulted and were planning for tagged RBC study, however, the patient decompensated in the early afternoon hours of 6/1. He developed respiratory depression and near respiratory arrest. PCCM was consulted.  PLAN: to return to [facility] when stable. 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   Significant Hospital Events: Including procedures, antibiotic start and stop dates in addition to other pertinent events    5/31 admit for GIB  6/1 transfer to ICU/shock. EGD and Flex sig performed by GI.    Expected Discharge Plan: Skilled Nursing Facility ([facility]) Barriers to Discharge:  Continued Medical Work up   Patient Goals and CMS Choice Patient states their goals for this hospitalization and ongoing recovery are:: unable to state vent and arterial lines in place      Expected Discharge Plan and Services Expected Discharge Plan: Skilled Nursing Facility ([facility])       Living arrangements for the past 2 months: Single Family Home                                      Prior Living Arrangements/Services Living arrangements for the past 2 months: Single Family Home Lives with:: Self                   Activities of Daily Living Home Assistive Devices/Equipment: Grab bars around toilet,Grab bars in shower,Hand-held shower hose,Hospital bed,Hoyer Lift,Scales,Blood pressure cuff,Vent/Trach supplies,Wheelchair,Nebulizer,Oxygen,Other (Comment),Feeding equipment (suction machine, peg tube, trach) ADL Screening (condition at time of admission) Patient's cognitive ability adequate to safely complete daily activities?: No Is the patient deaf or have difficulty hearing?: No Does the patient have difficulty seeing, even when wearing glasses/contacts?: No Does the patient have difficulty concentrating, remembering, or making decisions?: Yes Patient able to express need for assistance with ADLs?: No Does the patient have difficulty dressing or bathing?: Yes Independently performs ADLs?: No Communication: Dependent (patient non verbal) Is this a change from baseline?: Pre-admission baseline Dressing (OT): Dependent Is this a change from baseline?: Pre-admission baseline Grooming: Dependent Is this a change from baseline?: Pre-admission baseline Feeding: Dependent Is  this a change from baseline?: Pre-admission baseline Bathing: Dependent Is this a change from baseline?: Pre-admission baseline Toileting: Dependent Is this a change from baseline?: Pre-admission baseline In/Out Bed: Dependent Is this a change from baseline?: Pre-admission baseline Walks  in Home: Dependent Is this a change from baseline?: Pre-admission baseline Does the patient have difficulty walking or climbing stairs?: Yes (secondary to quadriplegia) Weakness of Legs: Both Weakness of Arms/Hands: Both  Permission Sought/Granted                  Emotional Assessment   Attitude/Demeanor/Rapport: Intubated (Following Commands or Not Following Commands) Affect (typically observed): Unable to Assess Orientation: : Fluctuating Orientation (Suspected and/or reported Sund[place]) Alcohol / Substance Use: Not Applicable Psych Involvement: No (comment)  Admission diagnosis:  Rectal bleeding [K62.5] CAP (community acquired pneumonia) [J18.9] Acute bleeding [R58] Anemia, unspecified type [D64.9] Patient Active Problem List   Diagnosis Date Noted  . Pressure injury of skin 02/03/2021  . Rectal bleeding 02/02/2021  . Quadriparesis (HCC) 02/02/2021  . Atrial fibrillation (HCC) 02/02/2021  . ARF (acute renal failure) (HCC) 02/02/2021  . Essential hypertension 02/02/2021  . Acute blood loss anemia 02/02/2021  . Acute bleeding 02/02/2021  . Hemorrhagic shock (HCC)    PCP:  Lum Keas Lovell Sheehan, MD Pharmacy:  No Pharmacies Listed    Social Determinants of Health (SDOH) Interventions    Readmission Risk Interventions No flowsheet data found.

## 2021-02-05 DIAGNOSIS — K625 Hemorrhage of anus and rectum: Secondary | ICD-10-CM | POA: Diagnosis not present

## 2021-02-05 DIAGNOSIS — R578 Other shock: Secondary | ICD-10-CM | POA: Diagnosis not present

## 2021-02-05 DIAGNOSIS — N179 Acute kidney failure, unspecified: Secondary | ICD-10-CM | POA: Diagnosis not present

## 2021-02-05 DIAGNOSIS — D62 Acute posthemorrhagic anemia: Secondary | ICD-10-CM | POA: Diagnosis not present

## 2021-02-05 DIAGNOSIS — R4182 Altered mental status, unspecified: Secondary | ICD-10-CM

## 2021-02-05 LAB — TYPE AND SCREEN
ABO/RH(D): O POS
Antibody Screen: NEGATIVE
Unit division: 0
Unit division: 0
Unit division: 0
Unit division: 0
Unit division: 0
Unit division: 0

## 2021-02-05 LAB — GLUCOSE, CAPILLARY
Glucose-Capillary: 111 mg/dL — ABNORMAL HIGH (ref 70–99)
Glucose-Capillary: 111 mg/dL — ABNORMAL HIGH (ref 70–99)
Glucose-Capillary: 120 mg/dL — ABNORMAL HIGH (ref 70–99)
Glucose-Capillary: 122 mg/dL — ABNORMAL HIGH (ref 70–99)
Glucose-Capillary: 124 mg/dL — ABNORMAL HIGH (ref 70–99)
Glucose-Capillary: 133 mg/dL — ABNORMAL HIGH (ref 70–99)

## 2021-02-05 LAB — BPAM RBC
Blood Product Expiration Date: 202206262359
Blood Product Expiration Date: 202206262359
Blood Product Expiration Date: 202206302359
Blood Product Expiration Date: 202206302359
Blood Product Expiration Date: 202206302359
Blood Product Expiration Date: 202206302359
ISSUE DATE / TIME: 202206010205
ISSUE DATE / TIME: 202206011223
ISSUE DATE / TIME: 202206011300
Unit Type and Rh: 5100
Unit Type and Rh: 5100
Unit Type and Rh: 5100
Unit Type and Rh: 5100
Unit Type and Rh: 5100
Unit Type and Rh: 5100

## 2021-02-05 LAB — BASIC METABOLIC PANEL
Anion gap: 12 (ref 5–15)
BUN: 91 mg/dL — ABNORMAL HIGH (ref 8–23)
CO2: 17 mmol/L — ABNORMAL LOW (ref 22–32)
Calcium: 8.6 mg/dL — ABNORMAL LOW (ref 8.9–10.3)
Chloride: 115 mmol/L — ABNORMAL HIGH (ref 98–111)
Creatinine, Ser: 2.25 mg/dL — ABNORMAL HIGH (ref 0.61–1.24)
GFR, Estimated: 30 mL/min — ABNORMAL LOW (ref >60)
Glucose, Bld: 119 mg/dL — ABNORMAL HIGH (ref 70–99)
Potassium: 3.2 mmol/L — ABNORMAL LOW (ref 3.5–5.1)
Sodium: 144 mmol/L (ref 135–145)

## 2021-02-05 LAB — CBC
HCT: 20.8 % — ABNORMAL LOW (ref 39.0–52.0)
Hemoglobin: 6.5 g/dL — CL (ref 13.0–17.0)
MCH: 27.3 pg (ref 26.0–34.0)
MCHC: 31.3 g/dL (ref 30.0–36.0)
MCV: 87.4 fL (ref 80.0–100.0)
Platelets: 141 10*3/uL — ABNORMAL LOW (ref 150–400)
RBC: 2.38 MIL/uL — ABNORMAL LOW (ref 4.22–5.81)
RDW: 17.2 % — ABNORMAL HIGH (ref 11.5–15.5)
WBC: 11.7 10*3/uL — ABNORMAL HIGH (ref 4.0–10.5)
nRBC: 1.6 % — ABNORMAL HIGH (ref 0.0–0.2)

## 2021-02-05 LAB — HEMOGLOBIN AND HEMATOCRIT, BLOOD
HCT: 24.7 % — ABNORMAL LOW (ref 39.0–52.0)
Hemoglobin: 7.6 g/dL — ABNORMAL LOW (ref 13.0–17.0)

## 2021-02-05 LAB — PREPARE RBC (CROSSMATCH)

## 2021-02-05 MED ORDER — SODIUM CHLORIDE 0.9% FLUSH
10.0000 mL | Freq: Two times a day (BID) | INTRAVENOUS | Status: DC
  Administered 2021-02-05 – 2021-02-06 (×3): 10 mL
  Administered 2021-02-06: 40 mL
  Administered 2021-02-07: 10 mL
  Administered 2021-02-07: 40 mL
  Administered 2021-02-08: 20 mL
  Administered 2021-02-08 – 2021-02-10 (×5): 10 mL
  Administered 2021-02-11: 40 mL
  Administered 2021-02-11: 10 mL

## 2021-02-05 MED ORDER — POTASSIUM CHLORIDE 20 MEQ PO PACK
40.0000 meq | PACK | Freq: Once | ORAL | Status: AC
  Administered 2021-02-05: 40 meq
  Filled 2021-02-05: qty 2

## 2021-02-05 MED ORDER — SODIUM CHLORIDE 0.9% FLUSH: 10.0000 mL | INTRAVENOUS | Status: DC | PRN

## 2021-02-05 MED ORDER — SODIUM CHLORIDE 0.9% IV SOLUTION: Freq: Once | INTRAVENOUS | Status: AC

## 2021-02-05 MED ORDER — SODIUM CHLORIDE 0.9 % IV SOLN
2.0000 g | Freq: Two times a day (BID) | INTRAVENOUS | Status: DC
  Administered 2021-02-05 – 2021-02-07 (×5): 2 g via INTRAVENOUS
  Filled 2021-02-05 (×5): qty 2

## 2021-02-05 NOTE — Progress Notes (Signed)
Pharmacy Antibiotic Note  Justin Solomon is a 70 y.o. male admitted on 02/01/2021 with possible IAI, pneumonia.  Pharmacy has been consulted for Cefepime and Vancomycin dosing.  02/05/21 7:58 AM  - vancomycin random level= 14 - SCr 2.25, improved+ - D4/7 antibiotics   Plan: Increase cefepime to 2 g iv q 12 hours Metronidazole per MD Continue vancomycin 1500 mg iv q 48 hours Daily SCr Address plans for antibiotics daily Follow up renal function, culture results, and clinical course.    Height: 6' (182.9 cm) Weight: 96.3 kg (212 lb 4.9 oz) IBW/kg (Calculated) : 77.6  Temp (24hrs), Avg:97.8 F (36.6 C), Min:97.2 F (36.2 C), Max:98.5 F (36.9 C)  Recent Labs  Lab 02/02/21 0230 02/02/21 0547 02/03/21 0243 02/03/21 0513 02/03/21 1031 02/03/21 1624 02/03/21 2007 02/04/21 0037 02/04/21 0453 02/04/21 0814 02/04/21 0835 02/05/21 0340  WBC  --    < > 22.3*  --  21.7* 26.0*  --  21.6*  --  17.8*  --  11.7*  CREATININE  --    < > 3.19*  --   --  2.87*  --   --  2.94* 2.80*  --  2.25*  LATICACIDVEN 3.1*  --   --  5.6* 6.1* 4.0* 4.5*  --   --   --   --   --   VANCORANDOM  --   --   --   --   --   --   --   --   --   --  14  --    < > = values in this interval not displayed.    Estimated Creatinine Clearance: 35.7 mL/min (A) (by C-G formula based on SCr of 2.25 mg/dL (H)).    No Known Allergies  Antimicrobials this admission: 6/1 Zosyn >> 6/1 6/1 Vanc >>  6/1 cefepime >>  6/1 Metronidazole >>   Microbiology results: 6/1 COVID, Influenza: neg 6/1 MRSA PCR: positive resp cx: pending  Thank you for allowing pharmacy to be a part of this patient's care.  Luisa Hart D  02/05/2021 7:58 AM

## 2021-02-05 NOTE — Progress Notes (Signed)
PT Cancellation Note  Patient Details Name: Justin Solomon MRN: [id number] DOB: 1951/03/28   Cancelled Treatment:     PT order received but eval deferred - RN advises pt on vent and receiving transfusion.  Will follow.   Earnest Mcgillis 02/05/2021, 10:16 AM

## 2021-02-05 NOTE — Progress Notes (Signed)
NAME:  Justin Solomon, MRN:  [id number], DOB:  09-13-1950, LOS: 3 ADMISSION DATE:  02/01/2021, CONSULTATION DATE: 6/1 REFERRING MD: Dr. Hal Hope TRH, CHIEF COMPLAINT: GI bleed  History of Present Illness:   Patient is encephalopathic and/or intubated. Therefore history has been obtained from chart review.   70 year old male with complicated recent past medical history significant for admission outside hospital back in March 2022 after a fall.  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   He is also unable to be liberated from the ventilator and has since undergone tracheostomy.  He was discharged to [facility] in [place] for ongoing rehab. 5/31 he presented to [facility] long ED from [facility] for evaluation of GI bleeding. Rn at [facility] noted bright red blood with clots in his brief as he was being changed. In the ED there were clots noted in the rectal area, but no active bleeding was observed. Vitals remained stable. CT abdomen concerning for large volume of stool concerning for constipation and Marked rectal distension. Also described loculated R pleural effusion. Hemoglobin 7.5.   The patient was admitted to the hospitalist service. GI was consulted and were planning for tagged RBC study, however, the patient decompensated in the early afternoon hours of 6/1. He developed respiratory depression and near respiratory arrest. PCCM was consulted.   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   Significant Hospital Events: Including procedures, antibiotic start and stop dates in addition to other pertinent events   . 5/31 admit for GIB . 6/1 transfer to ICU/shock. EGD and Flex sig performed by GI. Marland Kitchen 6/2 no transfusion . 6/4 hgb  6.5 transfused one unit     Scheduled Meds: .  chlorhexidine gluconate (MEDLINE KIT)  15 mL Mouth Rinse BID  . Chlorhexidine Gluconate Cloth  6 each Topical Q0600  . docusate  100 mg Per Tube BID  . feeding supplement (PROSource TF)  45 mL Per Tube BID  . mouth rinse  15 mL Mouth Rinse 10 times per day  . mupirocin ointment  1 application Nasal BID  . pantoprazole (PROTONIX) IV  40 mg Intravenous Q12H  . polyethylene glycol  17 g Per Tube Daily  . sodium chloride flush  10-40 mL Intracatheter Q12H   Continuous Infusions: . sodium chloride Stopped (02/03/21 1204)  . sodium chloride 20 mL/hr at 02/03/21 0347  . ceFEPime (MAXIPIME) IV    . dexmedetomidine (PRECEDEX) IV infusion Stopped (02/03/21 0811)  . dextrose 75 mL/hr at 02/05/21 0900  . feeding supplement (OSMOLITE 1.5 CAL) 60 mL/hr at 02/05/21 0730  . metronidazole Stopped (02/05/21 0204)  . norepinephrine (LEVOPHED) Adult infusion 2 mcg/min (02/05/21 0900)  . vancomycin Stopped (02/04/21 1325)   PRN Meds:.acetaminophen **OR** acetaminophen, metoprolol tartrate, sodium chloride flush     Interim History / Subjective:  Appears comfortable on psv this am but still req low dose levophed    Objective   Blood pressure (!) 111/56, pulse 80, temperature 97.9 F (36.6 C), resp. rate 15, height 6' (1.829 m), weight 96.3 kg, SpO2 98 %.    Vent Mode: PSV FiO2 (%):  [30 %-40 %] 30 % Set Rate:  [24 bmp] 24 bmp Vt Set:  [620 mL] 620 mL PEEP:  [5 cmH20] 5 cmH20 Pressure Support:  [5 cmH20] 5 cmH20 Plateau Pressure:  [18 cmH20-23 cmH20] 18  cmH20   Intake/Output Summary (Last 24 hours) at 02/05/2021 0943 Last data filed at 02/05/2021 0700 Gross per 24 hour  Intake 2341.56 ml  Output 1900 ml  Net 441.56 ml   Filed Weights   02/01/21 1951 02/03/21 0400 02/05/21 0351  Weight: 104.8 kg 86.9 kg 96.3 kg    Examination: Tmax  98.5 on Vanc/flagyl started 6/1 General:  Not responding to verbal  Oropharynx clear,  mucosa nl Neck supple Lungs with a few distant rhonchi  bilaterally RRR no s3 or or sign murmur Abd soft/ nl  excursion  Extr warm with no edema or clubbing noted/ boots in place         Labs/imaging that I have personally reviewed  (right click and "Reselect all SmartList Selections" daily)      Resolved Hospital Problem list     Assessment & Plan:   Hemorrhagic shock: cannot rule out sepsis with evidence on PNA/effusion on CT. Acute blood loss anemia Lactic Acidosis Lab Results  Component Value Date   HGB 6.5 (LL) 02/05/2021   HGB 7.8 (L) 02/04/2021   HGB 8.0 (L) 02/04/2021   >>> transfused another one unit prbcs am 6/4  >>> wean levophed off as tol/ check cvp to see if needs more  volume   Acute gastrointestinal hemorrhage: S/p EGD and flex sig on 6/1.  Presumably lower GI source considering the character of the blood, perhaps related to rectal ulcer from stercoral colitis.  - PPI infusion - Miralax - GI following  Acute hypoxemic respiratory failure - 4 cuffless trach exchanged for 6 cuff shiley. CXR with good placement.  - Full vent support - PSV as tol > trac collar next step  AKI Hyperkalemia Hypernatremia, improved  Lab Results  Component Value Date   CREATININE 2.25 (H) 02/05/2021   CREATININE 2.80 (H) 02/04/2021   CREATININE 2.94 (H) 02/04/2021    - Continue D5W to keep hydrated - Fluids as above for AKI  Elevated Liver Enzymes Likely shock liver per GI/ hep profile sent  - will continue to monitor  Concern for aspiration pneumonia Right sided pleural effusion -  MRSA pcr screen Pos 6/1 - Trach asp 6/3 no predom org on gm stain>>>   Ng to date  - HCAP coverage for LTACH resident cefepime, vanco, flagyl  Since 6/1 with plan for 7 days total rx - Follow cultures.   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  Failure to thrive - Will need ongoing goals of care discussions, palliative care consulted   Atrial fibrillation history - not on AC since epidural  hematoma.  - amio on hold for now.   Lung nodule - Outpatient follow up   Best practice (right click and "Reselect all SmartList Selections" daily)  Diet:  Tube Feed  Pain/Anxiety/Delirium protocol (if indicated): Yes (RASS goal 0) VAP protocol (if indicated): Yes DVT prophylaxis: SCD GI prophylaxis: PPI Glucose control:  SSI No Central venous access:  Yes, and it is still needed Arterial line:  Yes, and it is no longer needed Foley:  Yes, and it is still needed Mobility:  bed rest  PT consulted: N/A Last date of multidisciplinary goals of care discussion [spoke with son on the phone on 6/1, confirmed full code status] Code Status:  full code Disposition: ICU  Labs   CBC: Recent Labs  Lab 02/01/21 2300 02/02/21 0547 02/02/21 0939 02/02/21 1726 02/03/21 1031 02/03/21 1624 02/04/21 0037 02/04/21 0814 02/05/21 0340  WBC 22.1*   < >  24.5*   < > 21.7* 26.0* 21.6* 17.8* 11.7*  NEUTROABS 17.0*  --  17.1*  --   --   --   --   --   --   HGB 7.5*   < > 7.1*   < > 7.8* 8.4* 8.0* 7.8* 6.5*  HCT 26.0*   < > 24.1*   < > 25.8* 26.7* 25.2* 24.7* 20.8*  MCV 89.7   < > 90.6   < > 90.2 86.4 87.8 85.8 87.4  PLT 290   < > 269   < > 223 231 200 189 141*   < > = values in this interval not displayed.    Basic Metabolic Panel: Recent Labs  Lab 02/02/21 0939 02/02/21 1726 02/03/21 0243 02/03/21 0610 02/03/21 0655 02/03/21 0843 02/03/21 1624 02/03/21 2007 02/04/21 0037 02/04/21 0453 02/04/21 0814 02/05/21 0340  NA 149* 149* 147*  --  152*  --  148*  --   --   --  144 144  K 6.2* 5.9* 6.8*   < > 5.7*   < > 5.3* 5.4* 4.9 4.7 4.2 3.2*  CL 115* 113* 114*  --   --   --  116*  --   --   --  113* 115*  CO2 18* 18* 20*  --   --   --  20*  --   --   --  19* 17*  GLUCOSE 92 236* 149*  --   --   --  103*  --   --   --  106* 119*  BUN 98* 101* 93*  --   --   --  101*  --   --   --  109* 91*  CREATININE 2.76* 3.24* 3.19*  --   --   --  2.87*  --   --  2.94* 2.80* 2.25*  CALCIUM 10.3 9.9  9.6  --   --   --  8.8*  --   --   --  8.5* 8.6*  MG 2.8*  --  2.7*  --   --   --   --   --   --   --  2.4  --   PHOS 7.3*  --  9.3*  --   --   --   --   --   --   --  6.5*  --    < > = values in this interval not displayed.   GFR: Estimated Creatinine Clearance: 35.7 mL/min (A) (by C-G formula based on SCr of 2.25 mg/dL (H)). Recent Labs  Lab 02/03/21 0513 02/03/21 1031 02/03/21 1624 02/03/21 2007 02/04/21 0037 02/04/21 0814 02/05/21 0340  WBC  --  21.7* 26.0*  --  21.6* 17.8* 11.7*  LATICACIDVEN 5.6* 6.1* 4.0* 4.5*  --   --   --     Liver Function Tests: Recent Labs  Lab 02/02/21 0939 02/02/21 1726 02/03/21 0243 02/04/21 0814  AST 219* 499* 926* 921*  ALT 341* 565* 774* 850*  ALKPHOS 218* 237* 214* 196*  BILITOT 1.1 1.5* 1.0 1.0  PROT 7.7 7.7 7.4 6.4*  ALBUMIN 1.8* 1.9* 1.8* 1.6*   No results for input(s): LIPASE, AMYLASE in the last 168 hours. No results for input(s): AMMONIA in the last 168 hours.  ABG    Component Value Date/Time   PHART 7.346 (L) 02/03/2021 1620   PCO2ART 33.1 02/03/2021 1620   PO2ART 130 (H) 02/03/2021 1620   HCO3 17.6 (L) 02/03/2021 1620  TCO2 21 (L) 02/03/2021 0655   ACIDBASEDEF 6.8 (H) 02/03/2021 1620   O2SAT 98.6 02/03/2021 1620     Coagulation Profile: Recent Labs  Lab 02/01/21 2300 02/02/21 1726  INR 1.3* 1.7*    Cardiac Enzymes: No results for input(s): CKTOTAL, CKMB, CKMBINDEX, TROPONINI in the last 168 hours.  HbA1C: No results found for: HGBA1C  CBG: Recent Labs  Lab 02/04/21 1138 02/04/21 1540 02/04/21 2329 02/05/21 0328 02/05/21 0757  GLUCAP 86 98 107* 111* 133*      The patient is critically ill with multiple organ systems failure and requires high complexity decision making for assessment and support, frequent evaluation and titration of therapies, application of advanced monitoring technologies and extensive interpretation of multiple databases. Critical Care Time devoted to patient care services  described in this note is 35 minutes.    Christinia Gully, MD [facility] [place] [phone]   After 7:00 pm call Elink  [phone]

## 2021-02-05 NOTE — Progress Notes (Signed)
eLink Physician-Brief Progress Note Patient Name: Justin Solomon DOB: [birth year] MRN: [id number]   Date of Service  02/05/2021  HPI/Events of Note  Hypokalemia - K+ = 3.2 and Creatinine = 2.25.  eICU Interventions  Will replace K+.      Intervention Category Major Interventions: Electrolyte abnormality - evaluation and management  Fabiana Dromgoole Eugene 02/05/2021, 5:59 AM

## 2021-02-05 NOTE — Progress Notes (Signed)
eLink Physician-Brief Progress Note Patient Name: Justin Solomon DOB: 01-13-1951 MRN: [id number]   Date of Service  02/05/2021  HPI/Events of Note  Anemia - Hgb = 6.5.   eICU Interventions  Will transfuse 1 unit PRBC now.      Intervention Category Major Interventions: Other:  Jrue Jarriel Dennard Nip 02/05/2021, 5:01 AM

## 2021-02-05 NOTE — Progress Notes (Signed)
Date and time results received: 02/05/21 at 0424  Critical Value: Hgb at 6.5  Name of Provider Notified: Elink called  Orders Received? Or Actions Taken?: RN awaiting orders; will continue to monitor

## 2021-02-06 DIAGNOSIS — D62 Acute posthemorrhagic anemia: Secondary | ICD-10-CM | POA: Diagnosis not present

## 2021-02-06 DIAGNOSIS — K625 Hemorrhage of anus and rectum: Secondary | ICD-10-CM | POA: Diagnosis not present

## 2021-02-06 DIAGNOSIS — J9621 Acute and chronic respiratory failure with hypoxia: Secondary | ICD-10-CM

## 2021-02-06 DIAGNOSIS — G8250 Quadriplegia, unspecified: Secondary | ICD-10-CM | POA: Diagnosis not present

## 2021-02-06 DIAGNOSIS — N179 Acute kidney failure, unspecified: Secondary | ICD-10-CM | POA: Diagnosis not present

## 2021-02-06 LAB — BASIC METABOLIC PANEL
Anion gap: 10 (ref 5–15)
BUN: 76 mg/dL — ABNORMAL HIGH (ref 8–23)
CO2: 16 mmol/L — ABNORMAL LOW (ref 22–32)
Calcium: 8.4 mg/dL — ABNORMAL LOW (ref 8.9–10.3)
Chloride: 117 mmol/L — ABNORMAL HIGH (ref 98–111)
Creatinine, Ser: 1.74 mg/dL — ABNORMAL HIGH (ref 0.61–1.24)
GFR, Estimated: 41 mL/min — ABNORMAL LOW (ref >60)
Glucose, Bld: 114 mg/dL — ABNORMAL HIGH (ref 70–99)
Potassium: 3.1 mmol/L — ABNORMAL LOW (ref 3.5–5.1)
Sodium: 143 mmol/L (ref 135–145)

## 2021-02-06 LAB — HEMOGLOBIN AND HEMATOCRIT, BLOOD
HCT: 26.4 % — ABNORMAL LOW (ref 39.0–52.0)
Hemoglobin: 8.3 g/dL — ABNORMAL LOW (ref 13.0–17.0)

## 2021-02-06 LAB — CULTURE, RESPIRATORY W GRAM STAIN

## 2021-02-06 LAB — CBC
HCT: 13.4 % — ABNORMAL LOW (ref 39.0–52.0)
Hemoglobin: 3.9 g/dL — CL (ref 13.0–17.0)
MCH: 27.5 pg (ref 26.0–34.0)
MCHC: 29.1 g/dL — ABNORMAL LOW (ref 30.0–36.0)
MCV: 94.4 fL (ref 80.0–100.0)
Platelets: 72 10*3/uL — ABNORMAL LOW (ref 150–400)
RBC: 1.42 MIL/uL — ABNORMAL LOW (ref 4.22–5.81)
RDW: 17.2 % — ABNORMAL HIGH (ref 11.5–15.5)
WBC: 7.2 10*3/uL (ref 4.0–10.5)
nRBC: 2.5 % — ABNORMAL HIGH (ref 0.0–0.2)

## 2021-02-06 LAB — GLUCOSE, CAPILLARY
Glucose-Capillary: 103 mg/dL — ABNORMAL HIGH (ref 70–99)
Glucose-Capillary: 116 mg/dL — ABNORMAL HIGH (ref 70–99)
Glucose-Capillary: 122 mg/dL — ABNORMAL HIGH (ref 70–99)
Glucose-Capillary: 126 mg/dL — ABNORMAL HIGH (ref 70–99)
Glucose-Capillary: 128 mg/dL — ABNORMAL HIGH (ref 70–99)
Glucose-Capillary: 146 mg/dL — ABNORMAL HIGH (ref 70–99)

## 2021-02-06 MED ORDER — VANCOMYCIN HCL 10 G IV SOLR
1500.0000 mg | INTRAVENOUS | Status: DC
  Filled 2021-02-06: qty 1500

## 2021-02-06 MED ORDER — DILTIAZEM HCL-DEXTROSE 125-5 MG/125ML-% IV SOLN (PREMIX)
5.0000 mg/h | INTRAVENOUS | Status: DC
  Administered 2021-02-06: 5 mg/h via INTRAVENOUS
  Filled 2021-02-06: qty 125

## 2021-02-06 MED ORDER — METOPROLOL TARTRATE 25 MG/10 ML ORAL SUSPENSION
25.0000 mg | Freq: Two times a day (BID) | ORAL | Status: DC
  Administered 2021-02-06 – 2021-02-08 (×3): 25 mg
  Filled 2021-02-06 (×4): qty 10

## 2021-02-06 MED ORDER — DILTIAZEM LOAD VIA INFUSION
10.0000 mg | Freq: Once | INTRAVENOUS | Status: AC
  Administered 2021-02-06: 10 mg via INTRAVENOUS
  Filled 2021-02-06: qty 10

## 2021-02-06 MED ORDER — VANCOMYCIN HCL 1250 MG/250ML IV SOLN
1250.0000 mg | INTRAVENOUS | Status: DC
  Administered 2021-02-06: 1250 mg via INTRAVENOUS
  Filled 2021-02-06 (×2): qty 250

## 2021-02-06 NOTE — Progress Notes (Signed)
Pharmacy Antibiotic Note  Justin Solomon is a 70 y.o. male admitted on 02/01/2021 with possible IAI, pneumonia.  Pharmacy has been consulted for Cefepime and Vancomycin dosing.  02/06/21 9:59 AM  - SCr 1.74, improved - D5/7 antibiotics  - TA cx now with S aureus  Plan: Continue cefepime to 2 g iv q 12 hours Metronidazole per MD Increase vancomycin to 1250 mg IV Q 24 hrs. Goal AUC 400-550. Expected AUC: 466 SCr used: 1.74  Daily SCr Follow up renal function, culture results, and clinical course.    Height: 6' (182.9 cm) Weight: 94.6 kg (208 lb 8.9 oz) IBW/kg (Calculated) : 77.6  Temp (24hrs), Avg:98 F (36.7 C), Min:97.5 F (36.4 C), Max:98.5 F (36.9 C)  Recent Labs  Lab 02/02/21 0230 02/02/21 0547 02/03/21 0513 02/03/21 1031 02/03/21 1624 02/03/21 2007 02/04/21 0037 02/04/21 0453 02/04/21 0814 02/04/21 0835 02/05/21 0340 02/06/21 0500 02/06/21 0651  WBC  --    < >  --  21.7* 26.0*  --  21.6*  --  17.8*  --  11.7* 7.2  --   CREATININE  --    < >  --   --  2.87*  --   --  2.94* 2.80*  --  2.25*  --  1.74*  LATICACIDVEN 3.1*  --  5.6* 6.1* 4.0* 4.5*  --   --   --   --   --   --   --   VANCORANDOM  --   --   --   --   --   --   --   --   --  14  --   --   --    < > = values in this interval not displayed.    Estimated Creatinine Clearance: 45.8 mL/min (A) (by C-G formula based on SCr of 1.74 mg/dL (H)).    No Known Allergies  Antimicrobials this admission: 6/1 Zosyn >> 6/1 6/1 Vanc >>  6/1 cefepime >>  6/1 Metronidazole >>   Microbiology results: 6/1 COVID, Influenza: neg 6/1 MRSA PCR: positive 6/3 TA: moderate S aureus  Thank you for allowing pharmacy to be a part of this patient's care.  Luisa Hart D  02/06/2021 9:59 AM

## 2021-02-06 NOTE — Progress Notes (Signed)
Daily Progress Note   Patient Name: Justin Solomon       Date: 02/06/2021 DOB: 1951/03/07  Age: 70 y.o. MRN#: [id number] Attending Physician: Freddi Starr, MD Primary Care Physician: Aaron Edelman, MD Admit Date: 02/01/2021  Reason for Consultation/Follow-up: Establishing goals of care  Subjective: I saw and examined Justin Solomon today.  He remains unresponsive and does not interact with me today.  No family at bedside.  Length of Stay: 4  Current Medications: Scheduled Meds:  . chlorhexidine gluconate (MEDLINE KIT)  15 mL Mouth Rinse BID  . Chlorhexidine Gluconate Cloth  6 each Topical Q0600  . docusate  100 mg Per Tube BID  . feeding supplement (PROSource TF)  45 mL Per Tube BID  . mouth rinse  15 mL Mouth Rinse 10 times per day  . mupirocin ointment  1 application Nasal BID  . pantoprazole (PROTONIX) IV  40 mg Intravenous Q12H  . polyethylene glycol  17 g Per Tube Daily  . sodium chloride flush  10-40 mL Intracatheter Q12H    Continuous Infusions: . sodium chloride Stopped (02/03/21 1204)  . sodium chloride 20 mL/hr at 02/03/21 0347  . ceFEPime (MAXIPIME) IV Stopped (02/06/21 0949)  . dextrose 75 mL/hr at 02/06/21 0834  . feeding supplement (OSMOLITE 1.5 CAL) 1,000 mL (02/06/21 0737)  . metronidazole Stopped (02/06/21 1050)  . norepinephrine (LEVOPHED) Adult infusion 2 mcg/min (02/06/21 0800)  . vancomycin Stopped (02/06/21 1243)    PRN Meds: acetaminophen **OR** acetaminophen, metoprolol tartrate, sodium chloride flush  Physical Exam   General: Cachectic elderly appearing male in no distress, trach and c-collar Lungs: Clear bilateral with no wheezing Cardiovascular: Regular no murmur rubs or gallops Abdomen: PEG in place, soft, positive bowel  sounds Extremity: Thin with contractures noted Neuro: Unresponsive      Vital Signs: BP 117/60   Pulse 88   Temp 98.3 F (36.8 C) (Axillary)   Resp (!) 24   Ht 6' (1.829 m)   Wt 94.6 kg   SpO2 100%   BMI 28.29 kg/m  SpO2: SpO2: 100 % O2 Device: O2 Device: Tracheostomy Collar O2 Flow Rate: O2 Flow Rate (L/min): 8 L/min  Intake/output summary:   Intake/Output Summary (Last 24 hours) at 02/06/2021 1536 Last data filed at 02/06/2021 0800 Gross per 24 hour  Intake 2187.[place]  ml  Output 1600 ml  Net 587.82 ml   LBM: Last BM Date: 02/06/21 Baseline Weight: Weight: 104.8 kg Most recent weight: Weight: 94.6 kg       Palliative Assessment/Data:    Flowsheet Rows   Flowsheet Row Most Recent Value  Intake Tab   Referral Department Hospitalist  Unit at Time of Referral ICU  Palliative Care Primary Diagnosis Other (Comment)  [GI]  Date Notified 02/03/21  Palliative Care Type New Palliative care  Reason for referral Clarify Goals of Care  Date of Admission 02/01/21  Date first seen by Palliative Care 02/03/21  # of days Palliative referral response time 0 Day(s)  # of days IP prior to Palliative referral 2  Clinical Assessment   Palliative Performance Scale Score 10%  Psychosocial & Spiritual Assessment   Palliative Care Outcomes   Patient/Family meeting held? Yes  Who was at the meeting? Son via phone  Palliative Care Outcomes Clarified goals of care      Patient Active Problem List   Diagnosis Date Noted  . Acute and chronic respiratory failure with hypoxia ([place]) 02/06/2021  . Pressure injury of skin 02/03/2021  . Rectal bleeding 02/02/2021  . Quadriparesis ([place]) 02/02/2021  . Atrial fibrillation ([place]) 02/02/2021  . ARF (acute renal failure) ([place]) 02/02/2021  . Essential hypertension 02/02/2021  . Acute blood loss anemia 02/02/2021  . Acute bleeding 02/02/2021  . Hemorrhagic shock [facility])     Palliative Care Assessment & Plan   Patient Profile: 70 year old with  complicated history including quadriplegia, trach, and PEG who was admitted for GI bleeding  Recommendations/Plan:  Full code/full scope treatment  Continue current care.  Patient remains unresponsive today.  Family remains invested in plan to continue with aggressive care and are open to any and all offered interventions.  AMS: On 6/4, daughter reported being concerned with change in mental status as she feels that he has some change to his face with with drooping in his right eye.  He is still not responsive enough to participate in any kind of neuro exam.  We discussed that this thought to be most likely encephalopathy related to metabolic issues, infection, and other issues.  We discussed that determination of need for further work-up/imaging would be at discretion of critical care team and decision for any further testing would be based upon if there are changes that would occur to management plan based upon results of that testing.  Family expressed understanding.  Family reports he has been in c-collar for 8 weeks and his neurosurgeon (Dr. Amedeo Gory at [facility]) had stated that it could be removed at the 8-week mark.  Will attempt to reach out to Dr. Owens Shark tomorrow to determine if he agrees c-collar can be removed or if any further testing or imaging needs to be done prior to removal of c-collar.  Family questioning if there are other options at discharge other than going back to [facility].  Will discuss with TOC next week.  Palliative care to continue to follow.  Goals of Care and Additional Recommendations:  Limitations on Scope of Treatment: Full Scope Treatment  Code Status:    Code Status Orders  (From admission, onward)         Start     Ordered   02/02/21 0216  Full code  Continuous        02/02/21 0216        Code Status History    This patient has a current  code status but no historical code status.   Advance Care Planning Activity        Prognosis: Guarded  Discharge Planning:  To Be Determined  Thank you for allowing the Palliative Medicine Team to assist in the care of this patient.  Total Time 20 Prolonged Time Billed No   Greater than 50%  of this time was spent counseling and coordinating care related to the above assessment and plan.  Micheline Rough, MD  Please contact Palliative Medicine Team phone at [phone] for questions and concerns.

## 2021-02-06 NOTE — Plan of Care (Signed)
Discussed in front of patient plan of care for the evening, pain management and heart rate and rhythm control with E-Link RN/MD and no evidence of learning from the patient at this time.  Levophed titrated up to 7 mcg and Cardizem initiated at 5 mg/hr, 10 mg bolus and set at 2.5 mg /hr at this time.  Problem: Education: Goal: Knowledge of General Education information will improve Description: Including pain rating scale, medication(s)/side effects and non-pharmacologic comfort measures Outcome: Progressing

## 2021-02-06 NOTE — Progress Notes (Signed)
Hgb reported as 3.9. Verbal order to repeat hgb STAT

## 2021-02-06 NOTE — Progress Notes (Signed)
eLink Physician-Brief Progress Note Patient Name: Justin Solomon DOB: September 09, 1950 MRN: [id number]   Date of Service  02/06/2021  HPI/Events of Note  Anemia - Hgb = 3.9, however, BP has been stable 11/61 and HR = 91. Question if Hgb result is valid.   eICU Interventions  Plan: 1. Repeat CBC STAT.      Intervention Category Major Interventions: Other:  Landrie Beale Dennard Nip 02/06/2021, 7:03 AM

## 2021-02-06 NOTE — Progress Notes (Signed)
PT Cancellation Note  Patient Details Name: Justin Solomon MRN: [id number] DOB: Jul 02, 1951   Cancelled Treatment:     PT order received but eval deferred.  Per RN, pt currently on vent, is fully dependent quadriplegic at base line and currently following no cues.  Physician's note stating "will order PT for passive ROM".  RN agrees pt is not appropriate for skilled PT at this time and nursing can incorporate PROM into position changes and performance of hygiene etc.  PT service will sign off at this time.  Please reorder if pt condition and ability to participate improves.   Tosh Glaze 02/06/2021, 9:39 AM

## 2021-02-06 NOTE — Progress Notes (Signed)
NAME:  Justin Solomon, MRN:  [id number], DOB:  [birth year], LOS: 4 ADMISSION DATE:  02/01/2021, CONSULTATION DATE: 6/1 REFERRING MD: Dr. Hal Hope TRH, CHIEF COMPLAINT: GI bleed  History of Present Illness:   Patient is encephalopathic and/or intubated. Therefore history has been obtained from chart review.   70 year old male with complicated recent past medical history significant for admission outside hospital back in March 2022 after a fall.  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   He is also unable to be liberated from the ventilator and has since undergone tracheostomy.  He was discharged to [facility] in [place] for ongoing rehab. 5/31 he presented to [facility] long ED from [facility] for evaluation of GI bleeding. Rn at [facility] noted bright red blood with clots in his brief as he was being changed. In the ED there were clots noted in the rectal area, but no active bleeding was observed. Vitals remained stable. CT abdomen concerning for large volume of stool concerning for constipation and Marked rectal distension. Also described loculated R pleural effusion. Hemoglobin 7.5.   The patient was admitted to the hospitalist service. GI was consulted and were planning for tagged RBC study, however, the patient decompensated in the early afternoon hours of 6/1. He developed respiratory depression and near respiratory arrest. PCCM was consulted.   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   Significant Hospital Events: Including procedures, antibiotic start and stop dates in addition to other pertinent events   . 5/31 admit for GIB . 6/1 transfer to ICU/shock. EGD and Flex sig performed by GI. Marland Kitchen 6/2 no transfusion . 6/4 hgb  6.5 transfused one unit with hgb up to 8.3 6/5 am       Scheduled Meds: . chlorhexidine gluconate (MEDLINE KIT)  15 mL Mouth Rinse BID  . Chlorhexidine Gluconate Cloth  6 each Topical Q0600  . docusate  100 mg Per Tube BID  . feeding supplement (PROSource TF)  45 mL Per Tube BID  . mouth rinse  15 mL Mouth Rinse 10 times per day  . mupirocin ointment  1 application Nasal BID  . pantoprazole (PROTONIX) IV  40 mg Intravenous Q12H  . polyethylene glycol  17 g Per Tube Daily  . sodium chloride flush  10-40 mL Intracatheter Q12H   Continuous Infusions: . sodium chloride Stopped (02/03/21 1204)  . sodium chloride 20 mL/hr at 02/03/21 0347  . ceFEPime (MAXIPIME) IV Stopped (02/06/21 0949)  . dextrose 75 mL/hr at 02/06/21 0834  . feeding supplement (OSMOLITE 1.5 CAL) 1,000 mL (02/06/21 0737)  . metronidazole Stopped (02/06/21 1050)  . norepinephrine (LEVOPHED) Adult infusion 2 mcg/min (02/06/21 0800)  . vancomycin     PRN Meds:.acetaminophen **OR** acetaminophen, metoprolol tartrate, sodium chloride flush     Interim History / Subjective:  No response to verbal     Objective   Blood pressure 122/63, pulse 88, temperature 98.5 F (36.9 C), temperature source Axillary, resp. rate (!) 24, height 6' (1.829 m), weight 94.6 kg, SpO2 100 %. CVP:  [9 mmHg] 9 mmHg  Vent Mode: PSV FiO2 (%):  [30 %-38 %] 30 % Set Rate:  [24 bmp] 24 bmp Vt Set:  [620 mL] 620 mL PEEP:  [5 cmH20] 5 cmH20 Pressure Support:  [5 cmH20] 5 cmH20 Plateau Pressure:  [13 cmH20] 13 cmH20  Intake/Output Summary (Last 24 hours) at 02/06/2021 1108 Last data filed at 02/06/2021 0800 Gross per 24 hour  Intake 2502.82 ml  Output 2075 ml  Net 427.82 ml   Filed Weights   02/03/21 0400 02/05/21 0351 02/06/21 0500  Weight: 86.9 kg 96.3 kg 94.6 kg    Examination: Tmax 98.9   on Vanc/flagyl started 6/1 General appearance:   Vent /trach dep in c collar   Lungs with a few scattered exp > insp rhonchi bilaterally RRR no s3 or or sign murmur Abd soft with nl  excursion  Extr  warm with no edema or clubbing noted      Labs/imaging that I have personally reviewed  (right click and "Reselect all SmartList Selections" daily)      Resolved Hospital Problem list     Assessment & Plan:   Hemorrhagic shock: cannot rule out sepsis with evidence on PNA/effusion on CT. Acute blood loss anemia Lactic Acidosis Lab Results  Component Value Date   HGB 8.3 (L) 02/06/2021   HGB 3.9 (LL) 02/06/2021   HGB 7.6 (L) 02/05/2021   >>> transfused another one unit prbcs am 6/4  >>> wean levophed off as tol/ check cvp  Pending    Acute gastrointestinal hemorrhage: S/p EGD and flex sig on 6/1.  Presumably lower GI source considering the character of the blood, perhaps related to rectal ulcer from stercoral colitis.  - PPI infusion - Miralax - GI following  Acute hypoxemic respiratory failure - 4 cuffless trach exchanged for 6 cuff shiley.   - Full vent support - PSV as tol > trac collar next step  AKI Hyperkalemia Hypernatremia, improved  Lab Results  Component Value Date   CREATININE 1.74 (H) 02/06/2021   CREATININE 2.25 (H) 02/05/2021   CREATININE 2.80 (H) 02/04/2021   - Continue D5W to keep hydrated - Fluids as above for AKI  Elevated Liver Enzymes Likely shock liver per GI/ hep profile sent  - will continue to monitor  Concern for aspiration pneumonia Right sided pleural effusion -  MRSA pcr screen Pos 6/1 - Trach asp 6/3 no predom org on gm stain>>>   Staph aureus, sensitivities pending - HCAP coverage for LTACH resident cefepime, vanco, flagyl  Since 6/1 with plan for 7 days total rx - Follow cultures.   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  Failure to thrive - Will need ongoing goals of care discussions, palliative care consulted   Atrial fibrillation history - not on AC since epidural hematoma.  - amio on hold     Lung nodule - Outpatient follow up   Best practice (right click and "Reselect  all SmartList Selections" daily)  Diet:  Tube Feed  Pain/Anxiety/Delirium protocol (if indicated): Yes (RASS goal 0) VAP protocol (if indicated): Yes DVT prophylaxis: SCD GI prophylaxis: PPI Glucose control:  SSI No Central venous access:  Yes, and it is still needed Arterial line:  Yes, still needs it (on levophed) Foley:  Yes, and it is still needed Mobility:  bed rest  PT consulted: N/A Last date of multidisciplinary goals of care discussion [spoke with son on the phone on 6/1, confirmed full code status] Code Status:  full code Disposition: ICU  Labs   CBC: Recent Labs  Lab 02/01/21 2300 02/02/21 0547 02/02/21 0939 02/02/21 1726 02/03/21 1624 02/04/21 0037 02/04/21 0814 02/05/21 0340 02/05/21 1412 02/06/21 0500 02/06/21 0651  WBC 22.1*   < > 24.5*   < > 26.0* 21.6* 17.8*  11.7*  --  7.2  --   NEUTROABS 17.0*  --  17.1*  --   --   --   --   --   --   --   --   HGB 7.5*   < > 7.1*   < > 8.4* 8.0* 7.8* 6.5* 7.6* 3.9* 8.3*  HCT 26.0*   < > 24.1*   < > 26.7* 25.2* 24.7* 20.8* 24.7* 13.4* 26.4*  MCV 89.7   < > 90.6   < > 86.4 87.8 85.8 87.4  --  94.4  --   PLT 290   < > 269   < > 231 200 189 141*  --  72*  --    < > = values in this interval not displayed.    Basic Metabolic Panel: Recent Labs  Lab 02/02/21 0939 02/02/21 1726 02/03/21 0243 02/03/21 0610 02/03/21 0655 02/03/21 0843 02/03/21 1624 02/03/21 2007 02/04/21 0037 02/04/21 0453 02/04/21 0814 02/05/21 0340 02/06/21 0651  NA 149*   < > 147*  --  152*  --  148*  --   --   --  144 144 143  K 6.2*   < > 6.8*   < > 5.7*   < > 5.3*   < > 4.9 4.7 4.2 3.2* 3.1*  CL 115*   < > 114*  --   --   --  116*  --   --   --  113* 115* 117*  CO2 18*   < > 20*  --   --   --  20*  --   --   --  19* 17* 16*  GLUCOSE 92   < > 149*  --   --   --  103*  --   --   --  106* 119* 114*  BUN 98*   < > 93*  --   --   --  101*  --   --   --  109* 91* 76*  CREATININE 2.76*   < > 3.19*  --   --   --  2.87*  --   --  2.94* 2.80* 2.25*  1.74*  CALCIUM 10.3   < > 9.6  --   --   --  8.8*  --   --   --  8.5* 8.6* 8.4*  MG 2.8*  --  2.7*  --   --   --   --   --   --   --  2.4  --   --   PHOS 7.3*  --  9.3*  --   --   --   --   --   --   --  6.5*  --   --    < > = values in this interval not displayed.   GFR: Estimated Creatinine Clearance: 45.8 mL/min (A) (by C-G formula based on SCr of 1.74 mg/dL (H)). Recent Labs  Lab 02/03/21 0513 02/03/21 1031 02/03/21 1624 02/03/21 2007 02/04/21 0037 02/04/21 0814 02/05/21 0340 02/06/21 0500  WBC  --  21.7* 26.0*  --  21.6* 17.8* 11.7* 7.2  LATICACIDVEN 5.6* 6.1* 4.0* 4.5*  --   --   --   --     Liver Function Tests: Recent Labs  Lab 02/02/21 0939 02/02/21 1726 02/03/21 0243 02/04/21 0814  AST 219* 499* 926* 921*  ALT 341* 565* 774* 850*  ALKPHOS 218* 237* 214* 196*  BILITOT 1.1 1.5* 1.0 1.0  PROT  7.7 7.7 7.4 6.4*  ALBUMIN 1.8* 1.9* 1.8* 1.6*   No results for input(s): LIPASE, AMYLASE in the last 168 hours. No results for input(s): AMMONIA in the last 168 hours.  ABG    Component Value Date/Time   PHART 7.346 (L) 02/03/2021 1620   PCO2ART 33.1 02/03/2021 1620   PO2ART 130 (H) 02/03/2021 1620   HCO3 17.6 (L) 02/03/2021 1620   TCO2 21 (L) 02/03/2021 0655   ACIDBASEDEF 6.8 (H) 02/03/2021 1620   O2SAT 98.6 02/03/2021 1620     Coagulation Profile: Recent Labs  Lab 02/01/21 2300 02/02/21 1726  INR 1.3* 1.7*    Cardiac Enzymes: No results for input(s): CKTOTAL, CKMB, CKMBINDEX, TROPONINI in the last 168 hours.  HbA1C: No results found for: HGBA1C  CBG: Recent Labs  Lab 02/05/21 1510 02/05/21 1952 02/05/21 2352 02/06/21 0505 02/06/21 0726  GLUCAP 124* 122* 111* 146* 103*       The patient is critically ill with multiple organ systems failure and requires high complexity decision making for assessment and support, frequent evaluation and titration of therapies, application of advanced monitoring technologies and extensive interpretation of  multiple databases. Critical Care Time devoted to patient care services described in this note is 35 minutes.    Christinia Gully, MD Pulmonary and [place] [phone]   After 7:00 pm call Elink  [phone]

## 2021-02-06 NOTE — Progress Notes (Signed)
Daily Progress Note   Patient Name: Justin Solomon       Date: 02/06/2021 DOB: 1950/09/16  Age: 70 y.o. MRN#: [id number] Attending Physician: Freddi Starr, MD Primary Care Physician: Aaron Edelman, MD Admit Date: 02/01/2021  Reason for Consultation/Follow-up: Establishing goals of care  Subjective: I saw and examined Justin Solomon today.  He remains somnolent and does not interact.  His children met with me for a family meeting.  We discussed his clinical course over the past several months since his fall and initial surgery followed by secondary surgery for epidural hematoma.  Discussed the significant changes in his nutrition, cognition, and functional status.  We talked about the fact that he was making some gains but then became acutely ill and transitioned to the hospital with GI bleed.  We discussed hopes for the future and family remains invested in care plan for any and all aggressive interventions.  Discussed continuing to assess his situation overall and ensuring that we are still in a pathway towards things that are most important to him moving forward.  Discussed consideration for limitations of care.  Family will continue to discuss but at this point remained invested in continuation of any and all aggressive interventions.  See below.  Length of Stay: 4  Current Medications: Scheduled Meds:  . chlorhexidine gluconate (MEDLINE KIT)  15 mL Mouth Rinse BID  . Chlorhexidine Gluconate Cloth  6 each Topical Q0600  . docusate  100 mg Per Tube BID  . feeding supplement (PROSource TF)  45 mL Per Tube BID  . mouth rinse  15 mL Mouth Rinse 10 times per day  . mupirocin ointment  1 application Nasal BID  . pantoprazole (PROTONIX) IV  40 mg Intravenous Q12H  .  polyethylene glycol  17 g Per Tube Daily  . sodium chloride flush  10-40 mL Intracatheter Q12H    Continuous Infusions: . sodium chloride Stopped (02/03/21 1204)  . sodium chloride 20 mL/hr at 02/03/21 0347  . ceFEPime (MAXIPIME) IV Stopped (02/05/21 2329)  . dextrose 75 mL/hr at 02/06/21 0834  . feeding supplement (OSMOLITE 1.5 CAL) 1,000 mL (02/06/21 0737)  . metronidazole Stopped (02/06/21 6147)  . norepinephrine (LEVOPHED) Adult infusion 2 mcg/min (02/06/21 0800)  . vancomycin      PRN Meds: acetaminophen **OR** acetaminophen, metoprolol tartrate,  sodium chloride flush  Physical Exam   General: Cachectic elderly appearing male in no distress, trach and c-collar Lungs: Clear bilateral with no wheezing Cardiovascular: Regular no murmur rubs or gallops Abdomen: PEG in place, soft, positive bowel sounds Extremity: Thin with contractures noted Neuro: Unresponsive      Vital Signs: BP 122/63   Pulse 88   Temp 98.5 F (36.9 C) (Axillary)   Resp (!) 24   Ht 6' (1.829 m)   Wt 94.6 kg   SpO2 100%   BMI 28.29 kg/m  SpO2: SpO2: 100 % O2 Device: O2 Device: Ventilator O2 Flow Rate: O2 Flow Rate (L/min): 8 L/min  Intake/output summary:   Intake/Output Summary (Last 24 hours) at 02/06/2021 0908 Last data filed at 02/06/2021 0800 Gross per 24 hour  Intake 2502.82 ml  Output 2075 ml  Net 427.82 ml   LBM: Last BM Date: 02/06/21 Baseline Weight: Weight: 104.8 kg Most recent weight: Weight: 94.6 kg       Palliative Assessment/Data:    Flowsheet Rows   Flowsheet Row Most Recent Value  Intake Tab   Referral Department Hospitalist  Unit at Time of Referral ICU  Palliative Care Primary Diagnosis Other (Comment)  [GI]  Date Notified 02/03/21  Palliative Care Type New Palliative care  Reason for referral Clarify Goals of Care  Date of Admission 02/01/21  Date first seen by Palliative Care 02/03/21  # of days Palliative referral response time 0 Day(s)  # of days IP prior to  Palliative referral 2  Clinical Assessment   Palliative Performance Scale Score 10%  Psychosocial & Spiritual Assessment   Palliative Care Outcomes   Patient/Family meeting held? Yes  Who was at the meeting? Son via phone  Palliative Care Outcomes Clarified goals of care      Patient Active Problem List   Diagnosis Date Noted  . Pressure injury of skin 02/03/2021  . Rectal bleeding 02/02/2021  . Quadriparesis ([place]) 02/02/2021  . Atrial fibrillation ([place]) 02/02/2021  . ARF (acute renal failure) ([place]) 02/02/2021  . Essential hypertension 02/02/2021  . Acute blood loss anemia 02/02/2021  . Acute bleeding 02/02/2021  . Hemorrhagic shock [facility])     Palliative Care Assessment & Plan   Patient Profile: 70 year old with complicated history including quadriplegia, trach, and PEG who was admitted for GI bleeding  Recommendations/Plan:  Full code/full scope treatment  Continue current care.  Patient remains unresponsive today.  Family remains invested in plan to continue with aggressive care and are open to any and all offered interventions.  Daughter reports being concerned with change in mental status as she feels that he has some change to his face with with drooping in his right eye.  He is not responsive enough to participate in any kind of neuro exam.  We discussed that this thought to be most likely encephalopathy related to metabolic issues, infection, and other issues.  We discussed that determination of need for further work-up/imaging would be at discretion of critical care team and decision for any further testing would be based upon if there are changes that would occur to management plan based upon results of that testing.  Family expressed understanding.  Family reports he has been in c-collar for 8 weeks and his neurosurgeon (Dr. Amedeo Gory at [facility]) had stated that it could be removed at the 8-week mark.  I will see if we can reach out to Dr.  Owens Shark early next week to determine if he agrees  c-collar can be removed or if any further testing or imaging needs to be done prior to removal of c-collar.  Family questioning if there are other options at discharge other than going back to [facility].  Will discuss with TOC next week.  Palliative care to continue to follow.  Goals of Care and Additional Recommendations:  Limitations on Scope of Treatment: Full Scope Treatment  Code Status:    Code Status Orders  (From admission, onward)         Start     Ordered   02/02/21 0216  Full code  Continuous        02/02/21 0216        Code Status History    This patient has a current code status but no historical code status.   Advance Care Planning Activity       Prognosis: Guarded  Discharge Planning:  To Be Determined  Thank you for allowing the Palliative Medicine Team to assist in the care of this patient.  Start time: 1315 End time: 1400  Total Time 45 Prolonged Time Billed No   Greater than 50%  of this time was spent counseling and coordinating care related to the above assessment and plan.  Micheline Rough, MD  Please contact Palliative Medicine Team phone at [phone] for questions and concerns.

## 2021-02-07 ENCOUNTER — Inpatient Hospital Stay (HOSPITAL_COMMUNITY): Payer: Medicare Other

## 2021-02-07 DIAGNOSIS — J9621 Acute and chronic respiratory failure with hypoxia: Secondary | ICD-10-CM

## 2021-02-07 DIAGNOSIS — G9340 Encephalopathy, unspecified: Secondary | ICD-10-CM

## 2021-02-07 DIAGNOSIS — R58 Hemorrhage, not elsewhere classified: Secondary | ICD-10-CM

## 2021-02-07 DIAGNOSIS — I4891 Unspecified atrial fibrillation: Secondary | ICD-10-CM

## 2021-02-07 LAB — TYPE AND SCREEN
ABO/RH(D): O POS
Antibody Screen: NEGATIVE
Unit division: 0

## 2021-02-07 LAB — HEPATIC FUNCTION PANEL
ALT: 671 U/L — ABNORMAL HIGH (ref 0–44)
AST: 367 U/L — ABNORMAL HIGH (ref 15–41)
Albumin: 1.7 g/dL — ABNORMAL LOW (ref 3.5–5.0)
Alkaline Phosphatase: 148 U/L — ABNORMAL HIGH (ref 38–126)
Bilirubin, Direct: 0.1 mg/dL (ref 0.0–0.2)
Indirect Bilirubin: 0.6 mg/dL (ref 0.3–0.9)
Total Bilirubin: 0.7 mg/dL (ref 0.3–1.2)
Total Protein: 5.4 g/dL — ABNORMAL LOW (ref 6.5–8.1)

## 2021-02-07 LAB — AMMONIA: Ammonia: 31 umol/L (ref 9–35)

## 2021-02-07 LAB — BASIC METABOLIC PANEL
Anion gap: 7 (ref 5–15)
BUN: 69 mg/dL — ABNORMAL HIGH (ref 8–23)
CO2: 17 mmol/L — ABNORMAL LOW (ref 22–32)
Calcium: 8.3 mg/dL — ABNORMAL LOW (ref 8.9–10.3)
Chloride: 121 mmol/L — ABNORMAL HIGH (ref 98–111)
Creatinine, Ser: 1.47 mg/dL — ABNORMAL HIGH (ref 0.61–1.24)
GFR, Estimated: 50 mL/min — ABNORMAL LOW (ref >60)
Glucose, Bld: 149 mg/dL — ABNORMAL HIGH (ref 70–99)
Potassium: 3.1 mmol/L — ABNORMAL LOW (ref 3.5–5.1)
Sodium: 145 mmol/L (ref 135–145)

## 2021-02-07 LAB — CBC
HCT: 26.3 % — ABNORMAL LOW (ref 39.0–52.0)
Hemoglobin: 8.3 g/dL — ABNORMAL LOW (ref 13.0–17.0)
MCH: 27.9 pg (ref 26.0–34.0)
MCHC: 31.6 g/dL (ref 30.0–36.0)
MCV: 88.6 fL (ref 80.0–100.0)
Platelets: 148 10*3/uL — ABNORMAL LOW (ref 150–400)
RBC: 2.97 MIL/uL — ABNORMAL LOW (ref 4.22–5.81)
RDW: 17.5 % — ABNORMAL HIGH (ref 11.5–15.5)
WBC: 16.7 10*3/uL — ABNORMAL HIGH (ref 4.0–10.5)
nRBC: 1.4 % — ABNORMAL HIGH (ref 0.0–0.2)

## 2021-02-07 LAB — GLUCOSE, CAPILLARY
Glucose-Capillary: 126 mg/dL — ABNORMAL HIGH (ref 70–99)
Glucose-Capillary: 137 mg/dL — ABNORMAL HIGH (ref 70–99)
Glucose-Capillary: 138 mg/dL — ABNORMAL HIGH (ref 70–99)
Glucose-Capillary: 147 mg/dL — ABNORMAL HIGH (ref 70–99)
Glucose-Capillary: 154 mg/dL — ABNORMAL HIGH (ref 70–99)
Glucose-Capillary: 162 mg/dL — ABNORMAL HIGH (ref 70–99)

## 2021-02-07 LAB — BPAM RBC
Blood Product Expiration Date: 202206282359
ISSUE DATE / TIME: 202206040826
Unit Type and Rh: 5100

## 2021-02-07 MED ORDER — POTASSIUM CHLORIDE 20 MEQ PO PACK: 40.0000 meq | PACK | Freq: Every day | ORAL | Status: DC

## 2021-02-07 MED ORDER — LINEZOLID 600 MG/300ML IV SOLN
600.0000 mg | Freq: Two times a day (BID) | INTRAVENOUS | Status: AC
  Administered 2021-02-07 – 2021-02-09 (×5): 600 mg via INTRAVENOUS
  Filled 2021-02-07 (×6): qty 300

## 2021-02-07 MED ORDER — FREE WATER
50.0000 mL | Status: DC
  Administered 2021-02-07 – 2021-02-13 (×35): 50 mL

## 2021-02-07 MED ORDER — SODIUM CHLORIDE 0.9 % IV SOLN
2.0000 g | Freq: Two times a day (BID) | INTRAVENOUS | Status: DC
  Administered 2021-02-07 – 2021-02-08 (×2): 2 g via INTRAVENOUS
  Filled 2021-02-07 (×2): qty 2

## 2021-02-07 MED ORDER — AMIODARONE LOAD VIA INFUSION
150.0000 mg | Freq: Once | INTRAVENOUS | Status: DC
  Filled 2021-02-07: qty 83.34

## 2021-02-07 MED ORDER — AMIODARONE HCL IN DEXTROSE 360-4.14 MG/200ML-% IV SOLN: 60.0000 mg/h | INTRAVENOUS | Status: DC

## 2021-02-07 MED ORDER — LACTATED RINGERS IV BOLUS
500.0000 mL | Freq: Once | INTRAVENOUS | Status: AC
  Administered 2021-02-07: 500 mL via INTRAVENOUS

## 2021-02-07 MED ORDER — CHLORHEXIDINE GLUCONATE CLOTH 2 % EX PADS
6.0000 | MEDICATED_PAD | Freq: Every day | CUTANEOUS | Status: DC
  Administered 2021-02-07 – 2021-02-15 (×9): 6 via TOPICAL

## 2021-02-07 MED ORDER — AMIODARONE HCL IN DEXTROSE 360-4.14 MG/200ML-% IV SOLN: 30.0000 mg/h | INTRAVENOUS | Status: DC

## 2021-02-07 NOTE — Progress Notes (Signed)
Pharmacy Antibiotic Note  Justin Solomon is a 70 y.o. male admitted on 02/01/2021 with possible IAI, pneumonia.  Pharmacy has been consulted for Cefepime and Vancomycin dosing.  02/07/21 10:37 AM  - SCr 1.47, improved, CrCl ~ 54 ml/min Tm 99.4, WBC up to 16.7 - D6 antibiotics  - TA cx MRSA & corynebacterium  Plan: Continue cefepime 2 gm IVq 12 hours DC Flagy per MD Change vancomycin to zyvox 600 mg IV q12h to cover both MRSA & Corynebacterium  Height: 6' (182.9 cm) Weight: 95.6 kg (210 lb 12.2 oz) IBW/kg (Calculated) : 77.6  Temp (24hrs), Avg:98.3 F (36.8 C), Min:97.6 F (36.4 C), Max:99.4 F (37.4 C)  Recent Labs  Lab 02/02/21 0230 02/02/21 0547 02/03/21 0513 02/03/21 1031 02/03/21 1624 02/03/21 2007 02/04/21 0037 02/04/21 0453 02/04/21 5053 02/04/21 0835 02/05/21 0340 02/06/21 0500 02/06/21 0651 02/07/21 0415  WBC  --    < >  --  21.7* 26.0*  --  21.6*  --  17.8*  --  11.7* 7.2  --  16.7*  CREATININE  --    < >  --   --  2.87*  --   --  2.94* 2.80*  --  2.25*  --  1.74* 1.47*  LATICACIDVEN 3.1*  --  5.6* 6.1* 4.0* 4.5*  --   --   --   --   --   --   --   --   VANCORANDOM  --   --   --   --   --   --   --   --   --  14  --   --   --   --    < > = values in this interval not displayed.    Estimated Creatinine Clearance: 54.5 mL/min (A) (by C-G formula based on SCr of 1.47 mg/dL (H)).    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  Herby Abraham, Pharm.D 02/07/2021 11:00 AM

## 2021-02-07 NOTE — Progress Notes (Signed)
Nutrition Follow-up  DOCUMENTATION CODES:   Not applicable  INTERVENTION:  - continue Osmolite 1.5 @ 60 ml/hr via J-port with 45 ml Prosource TF BID via G-port. - this regimen is providing 2240 kcal, 112 grams protein, and 1097 ml free water.  - will order free water flush of 50 ml every 4 hours (300 ml/24 hrs) via G-port.   NUTRITION DIAGNOSIS:   Inadequate oral intake related to inability to eat as evidenced by NPO status. -ongoing  GOAL:   Patient will meet greater than or equal to 90% of their needs -met with TF regimen  MONITOR:   Vent status,TF tolerance,Labs,Weight trends,Skin  ASSESSMENT:   70 year old male with medical history of afib, HTN, hospitalization in 11/2020 after a fall requiring spinal surgery which was complicated in epidural hematoma and resulted in quadriplegia. He was unable to be liberalized from the vent and required trach and PEG. Patient resides at [facility]. He presented to the ED on 5/31 from [facility] for evaluation of GIB. CT abdomen concerning for large volume of stool concerning for constipation and rectal distension. Also noted to have R pleural effusion.  Patient was on vent via trach overnight and is now on trach collar. PEG-J in place. Patient currently being cleaned up after a BM. Able to talk with RN who reports patient is tolerating TF goal regimen (outlined above).   Weight has been fluctuating throughout hospitalization. Non-pitting edema to BUE and mild pitting edema to BLE documented in the edema section of flow sheet.     Labs reviewed; CBGs: 154 and 147 mg/dl, K: 3.1 mmol/l, Cl: 121 mmol/l, BUN: 69 mg/dl, creatinine: 1.47 mg/dl, Ca: 8.3 mg/dl, GFR: 50 ml/min.  Medications reviewed; 100 mg colace BID, 40 mg IV protonix BID, 17 g miralax/day. IVF; NS @ 20 ml/hr (480 ml/24 hrs).   Diet Order:   Diet Order            Diet NPO time specified  Diet effective now                 EDUCATION NEEDS:   No education needs have been  identified at this time  Skin:  Skin Assessment: Skin Integrity Issues: Skin Integrity Issues:: Stage II Stage II: R vertebral column  Last BM:  6/5 (type 6 x1)  Height:   Ht Readings from Last 1 Encounters:  02/01/21 6' (1.829 m)    Weight:   Wt Readings from Last 1 Encounters:  02/07/21 95.6 kg     Estimated Nutritional Needs:  Kcal:  7262-0355 kcal Protein:  105-120 grams Fluid:  >/= 2 L/day      Jarome Matin, MS, RD, LDN, CNSC Inpatient Clinical Dietitian RD pager # available in [place]  After hours/weekend pager # available in [facility]

## 2021-02-07 NOTE — Progress Notes (Signed)
NAME:  Justin Solomon, MRN:  [id number], DOB:  1951-03-29, LOS: 5 ADMISSION DATE:  02/01/2021, CONSULTATION DATE: 6/1 REFERRING MD: Dr. Hal Hope TRH, CHIEF COMPLAINT: GI bleed  History of Present Illness:   Patient is encephalopathic and/or intubated. Therefore history has been obtained from chart review.   70 year old male with complicated recent past medical history significant for admission outside hospital back in March 2022 after a fall.  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   He is also unable to be liberated from the ventilator and has since undergone tracheostomy.  He was discharged to [facility] in [place] for ongoing rehab. 5/31 he presented to [facility] long ED from [facility] for evaluation of GI bleeding. Rn at [facility] noted bright red blood with clots in his brief as he was being changed. In the ED there were clots noted in the rectal area, but no active bleeding was observed. Vitals remained stable. CT abdomen concerning for large volume of stool concerning for constipation and Marked rectal distension. Also described loculated R pleural effusion. Hemoglobin 7.5.   The patient was admitted to the hospitalist service. GI was consulted and were planning for tagged RBC study, however, the patient decompensated in the early afternoon hours of 6/1. He developed respiratory depression and near respiratory arrest. PCCM was consulted.   Pertinent  Medical History   has a past medical history of Acute respiratory failure ([place]), Atrial fibrillation ([place]), Essential hypertension (i), and Paralysis due to acute stroke ([place]).   Significant Hospital Events: Including procedures, antibiotic start and stop dates in addition to other pertinent events   . 5/31 admit for GIB . 6/1 transfer to ICU/shock. EGD and Flex sig performed by GI. Marland Kitchen 6/2 no transfusion . 6/4 hgb  6.5 transfused one unit with hgb up to 8.3 6/5 am  . 6/6  hemoglobin remained stable, started on Cardizem overnight and required low-dose Levophed.  On [place] cefepime and Flagyl day 5     Scheduled Meds: . chlorhexidine gluconate (MEDLINE KIT)  15 mL Mouth Rinse BID  . Chlorhexidine Gluconate Cloth  6 each Topical Q0600  . docusate  100 mg Per Tube BID  . feeding supplement (PROSource TF)  45 mL Per Tube BID  . mouth rinse  15 mL Mouth Rinse 10 times per day  . metoprolol tartrate  25 mg Per Tube BID  . mupirocin ointment  1 application Nasal BID  . pantoprazole (PROTONIX) IV  40 mg Intravenous Q12H  . polyethylene glycol  17 g Per Tube Daily  . sodium chloride flush  10-40 mL Intracatheter Q12H   Continuous Infusions: . sodium chloride Stopped (02/03/21 1204)  . sodium chloride 20 mL/hr at 02/03/21 0347  . ceFEPime (MAXIPIME) IV Stopped (02/06/21 2310)  . dextrose 75 mL/hr at 02/07/21 0643  . diltiazem (CARDIZEM) infusion 10 mg/hr (02/07/21 0643)  . feeding supplement (OSMOLITE 1.5 CAL) 60 mL/hr at 02/07/21 0642  . metronidazole Stopped (02/07/21 0236)  . norepinephrine (LEVOPHED) Adult infusion 6 mcg/min (02/07/21 0703)  . vancomycin Stopped (02/06/21 1243)   PRN Meds:.acetaminophen **OR** acetaminophen, metoprolol tartrate, sodium chloride flush     Interim History / Subjective:  No response to verbal     Objective   Blood pressure (!) 113/52, pulse 77, temperature 99.4 F (37.4 C), resp. rate (!) 26, height 6' (1.829 m), weight 95.6 kg, SpO2 98 %. CVP:  [9 mmHg-10 mmHg] 10 mmHg  Vent Mode: PRVC FiO2 (%):  [  30 %-35 %] 30 % Set Rate:  [24 bmp] 24 bmp Vt Set:  [941 mL] 620 mL PEEP:  [5 cmH20] 5 cmH20 Pressure Support:  [5 cmH20] 5 cmH20 Plateau Pressure:  [14 cmH20-21 cmH20] 21 cmH20   Intake/Output Summary (Last 24 hours) at 02/07/2021 0806 Last data filed at 02/07/2021 7408 Gross per 24 hour  Intake 5586 ml  Output 2150 ml  Net 3436 ml   Filed Weights   02/05/21 0351 02/06/21 0500 02/07/21 0428  Weight: 96.3 kg 94.6 kg  95.6 kg   General: Elderly male, trach and vent dependent, not responsive to voice or sternal rub HEENT: MM pink/moist, trach in place, sclera anicteric Neuro: Occasionally flutters eyes to voice, does not withdraw, is triggering breaths on vent CV: s1s2 irregular, heart rate 60s, no m/r/g PULM: Mechanical breath sounds with decreased air movement bilateral bases GI: soft, bsx4 active  Extremities: warm/dry, no edema  Skin: no rashes or lesions       Labs/imaging that I have personally reviewed  (right click and "Reselect all SmartList Selections" daily)   CBC-White blood cell count 16 K, hemoglobin stable 8.3 BMP-potassium 3.1, creatinine 1.4  Resolved Hospital Problem list     Assessment & Plan:    GI bleed with presenting hemorrhagic shock S/p EGD and flex sig on 6/1 without stigmata of bleeding, thought to be secondary to stair coral colitis and possible rectal ulcer P: -Hemoglobin stable s/p transfusion, continue to monitor for signs of bleeding and transfusion as needed -GI now signed off -Continue PPI and MiraLAX     Acute on chronic hypoxic respiratory failure with possible MRSA pneumonia and sepsis Baseline on trach collar, has required full vent support P: -Tolerating pressure support this morning, continue to wean as able -4 cuffless trach was changed for Shiley cuffed #6 -Continue routine trach care -VAP prevention -- Lactic acid downtrending -Remains on low-dose Levophed 2 to 4 mcg, continue to maintain MAP> 65 -Vancomycin, Flagyl, cefepime initiated 6/1, stop Flagyl, continue bank and cefepime for minimum of 7 days given white blood cell count uptrending    AKI Creatinine improving by 2 L urine output yesterday P: -Continue to monitor renal indices and electrolytes, avoid nephrotoxins as able monitor urine output   Atrial fibrillation, HTN On amiodarone, metoprolol, Norvasc, Lipitor at baseline P: -Required Cardizem drip overnight, now titrated  off and rate controlled -Continuing metoprolol, per report low-dose Levophed was initiated with Cardizem.  Now off Cardizem and will attempt to wean off vasopressor.  If unable will DC beta-blocker and treat with amiodarone     Elevated liver enzymes -Continue to monitor  Baseline quadriplegia Patient had a fall 11/2020 cervical fracture and subsequent epidural hematoma s/p evacuation and subsequent quadriplegia P: -Presented from [place], family coming in and will discuss goals of care and disposition per palliative note desire full, aggressive care -Palliative attempting to get in touch with patient's neurosurgeon Owens Shark at [facility] to see if C-collar can be removed    Encephalopathy -Patient is minimally responsive, appears this is unchanged from presentation.  Discussed with family what his baseline at [facility] has been.  This is possibly metabolic and secondary to infection and shock.  CT head in March without signs of stroke      Lung nodule - Outpatient follow up   Best practice (right click and "Reselect all SmartList Selections" daily)  Diet:  Tube Feed  Pain/Anxiety/Delirium protocol (if indicated): Yes (RASS goal 0) VAP protocol (if indicated): Yes DVT  prophylaxis: SCD GI prophylaxis: PPI Glucose control:  SSI No Central venous access:  Yes, and it is still needed, once off Levophed will attempt to discontinue Arterial line:  Yes, still needs it (on levophed) Foley:  Yes, and it is still needed Mobility:  bed rest  PT consulted: N/A Last date of multidisciplinary goals of care discussion [spoke with son on the phone on 6/1, confirmed full code status], family update pending arrival on 6/6  code Status:  full code Disposition: ICU  Labs   CBC: Recent Labs  Lab 02/01/21 2300 02/02/21 0547 02/02/21 0939 02/02/21 1726 02/04/21 0037 02/04/21 0814 02/05/21 0340 02/05/21 1412 02/06/21 0500 02/06/21 0651 02/07/21 0415  WBC 22.1*   < > 24.5*   <  > 21.6* 17.8* 11.7*  --  7.2  --  16.7*  NEUTROABS 17.0*  --  17.1*  --   --   --   --   --   --   --   --   HGB 7.5*   < > 7.1*   < > 8.0* 7.8* 6.5* 7.6* 3.9* 8.3* 8.3*  HCT 26.0*   < > 24.1*   < > 25.2* 24.7* 20.8* 24.7* 13.4* 26.4* 26.3*  MCV 89.7   < > 90.6   < > 87.8 85.8 87.4  --  94.4  --  88.6  PLT 290   < > 269   < > 200 189 141*  --  72*  --  148*   < > = values in this interval not displayed.    Basic Metabolic Panel: Recent Labs  Lab 02/02/21 0939 02/02/21 1726 02/03/21 0243 02/03/21 0610 02/03/21 1624 02/03/21 2007 02/04/21 0453 02/04/21 0814 02/05/21 0340 02/06/21 0651 02/07/21 0415  NA 149*   < > 147*   < > 148*  --   --  144 144 143 145  K 6.2*   < > 6.8*   < > 5.3*   < > 4.7 4.2 3.2* 3.1* 3.1*  CL 115*   < > 114*  --  116*  --   --  113* 115* 117* 121*  CO2 18*   < > 20*  --  20*  --   --  19* 17* 16* 17*  GLUCOSE 92   < > 149*  --  103*  --   --  106* 119* 114* 149*  BUN 98*   < > 93*  --  101*  --   --  109* 91* 76* 69*  CREATININE 2.76*   < > 3.19*  --  2.87*  --  2.94* 2.80* 2.25* 1.74* 1.47*  CALCIUM 10.3   < > 9.6  --  8.8*  --   --  8.5* 8.6* 8.4* 8.3*  MG 2.8*  --  2.7*  --   --   --   --  2.4  --   --   --   PHOS 7.3*  --  9.3*  --   --   --   --  6.5*  --   --   --    < > = values in this interval not displayed.   GFR: Estimated Creatinine Clearance: 54.5 mL/min (A) (by C-G formula based on SCr of 1.47 mg/dL (H)). Recent Labs  Lab 02/03/21 0513 02/03/21 1031 02/03/21 1624 02/03/21 2007 02/04/21 0037 02/04/21 0814 02/05/21 0340 02/06/21 0500 02/07/21 0415  WBC  --  21.7* 26.0*  --    < > 17.8* 11.7*  7.2 16.7*  LATICACIDVEN 5.6* 6.1* 4.0* 4.5*  --   --   --   --   --    < > = values in this interval not displayed.    Liver Function Tests: Recent Labs  Lab 02/02/21 0939 02/02/21 1726 02/03/21 0243 02/04/21 0814  AST 219* 499* 926* 921*  ALT 341* 565* 774* 850*  ALKPHOS 218* 237* 214* 196*  BILITOT 1.1 1.5* 1.0 1.0  PROT 7.7 7.7  7.4 6.4*  ALBUMIN 1.8* 1.9* 1.8* 1.6*   No results for input(s): LIPASE, AMYLASE in the last 168 hours. No results for input(s): AMMONIA in the last 168 hours.  ABG    Component Value Date/Time   PHART 7.346 (L) 02/03/2021 1620   PCO2ART 33.1 02/03/2021 1620   PO2ART 130 (H) 02/03/2021 1620   HCO3 17.6 (L) 02/03/2021 1620   TCO2 21 (L) 02/03/2021 0655   ACIDBASEDEF 6.8 (H) 02/03/2021 1620   O2SAT 98.6 02/03/2021 1620     Coagulation Profile: Recent Labs  Lab 02/01/21 2300 02/02/21 1726  INR 1.3* 1.7*    Cardiac Enzymes: No results for input(s): CKTOTAL, CKMB, CKMBINDEX, TROPONINI in the last 168 hours.  HbA1C: No results found for: HGBA1C  CBG: Recent Labs  Lab 02/06/21 1548 02/06/21 1931 02/06/21 2350 02/07/21 0353 02/07/21 0722  GLUCAP 116* 122* 128* 154* 147*   CRITICAL CARE Performed by: Otilio Carpen Sammie Schermerhorn   Total critical care time: 40 minutes  Critical care time was exclusive of separately billable procedures and treating other patients.  Critical care was necessary to treat or prevent imminent or life-threatening deterioration.  Critical care was time spent personally by me on the following activities: development of treatment plan with patient and/or surrogate as well as nursing, discussions with consultants, evaluation of patient's response to treatment, examination of patient, obtaining history from patient or surrogate, ordering and performing treatments and interventions, ordering and review of laboratory studies, ordering and review of radiographic studies, pulse oximetry and re-evaluation of patient's condition.    Otilio Carpen Ria Redcay, PA-C [facility] See Amion for pager If no response to pager , please call [phone] until 7pm After 7:00 pm call Elink  [phone]?[place]

## 2021-02-07 NOTE — Plan of Care (Signed)
Discussed with patient in front of family plan of care for the evening, pain management and coping with disease process with some teach back displayed.  No evidence of learning by the patient at this time.  Problem: Education: Goal: Knowledge of General Education information will improve Description: Including pain rating scale, medication(s)/side effects and non-pharmacologic comfort measures Outcome: Progressing   Problem: Pain Managment: Goal: General experience of comfort will improve Outcome: Progressing

## 2021-02-07 NOTE — Progress Notes (Signed)
Pt weaned on 35% ATC from 08:30-18:30.  Tolerated well.

## 2021-02-07 NOTE — TOC Progression Note (Signed)
Transition of Care [facility]) - Progression Note    Patient Details  Name: Justin Solomon MRN: [id number] Date of Birth: [birth year]  Transition of Care [facility]) CM/SW Contact  Golda Acre, RN Phone Number: 02/07/2021, 7:39 AM  Clinical Narrative:    70 year old male with complicated recent past medical history significant for admission outside hospital back in March 2022 after a fall.  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   He is also unable to be liberated from the ventilator and has since undergone tracheostomy.  He was discharged to [facility] in [place] for ongoing rehab. 5/31 he presented to [facility] long ED from [facility] for evaluation of GI bleeding. Rn at [facility] noted bright red blood with clots in his brief as he was being changed. In the ED there were clots noted in the rectal area, but no active bleeding was observed. Vitals remained stable. CT abdomen concerning for large volume of stool concerning for constipation and Marked rectal distension. Also described loculated R pleural effusion. Hemoglobin 7.5.  PLAN: remains on vent at 30% Fi02, iv abx, iv cardizem and levo, following for toc needs.  Expected Discharge Plan: Skilled Nursing Facility ([facility]) Barriers to Discharge: Continued Medical Work up  Expected Discharge Plan and Services Expected Discharge Plan: Skilled Nursing Facility ([facility])       Living arrangements for the past 2 months: Single Family Home                                       Social Determinants of Health (SDOH) Interventions    Readmission Risk Interventions No flowsheet data found.

## 2021-02-08 ENCOUNTER — Inpatient Hospital Stay (HOSPITAL_COMMUNITY): Payer: Medicare Other

## 2021-02-08 DIAGNOSIS — J15212 Pneumonia due to Methicillin resistant Staphylococcus aureus: Secondary | ICD-10-CM

## 2021-02-08 LAB — BASIC METABOLIC PANEL
Anion gap: 9 (ref 5–15)
BUN: 59 mg/dL — ABNORMAL HIGH (ref 8–23)
CO2: 18 mmol/L — ABNORMAL LOW (ref 22–32)
Calcium: 8.1 mg/dL — ABNORMAL LOW (ref 8.9–10.3)
Chloride: 114 mmol/L — ABNORMAL HIGH (ref 98–111)
Creatinine, Ser: 1.23 mg/dL (ref 0.61–1.24)
GFR, Estimated: >60 mL/min (ref >60)
Glucose, Bld: 134 mg/dL — ABNORMAL HIGH (ref 70–99)
Potassium: 3.2 mmol/L — ABNORMAL LOW (ref 3.5–5.1)
Sodium: 141 mmol/L (ref 135–145)

## 2021-02-08 LAB — GLUCOSE, CAPILLARY
Glucose-Capillary: 124 mg/dL — ABNORMAL HIGH (ref 70–99)
Glucose-Capillary: 132 mg/dL — ABNORMAL HIGH (ref 70–99)
Glucose-Capillary: 156 mg/dL — ABNORMAL HIGH (ref 70–99)
Glucose-Capillary: 114 mg/dL — ABNORMAL HIGH (ref 70–99)
Glucose-Capillary: 123 mg/dL — ABNORMAL HIGH (ref 70–99)

## 2021-02-08 LAB — CBC
HCT: 26 % — ABNORMAL LOW (ref 39.0–52.0)
Hemoglobin: 8.2 g/dL — ABNORMAL LOW (ref 13.0–17.0)
MCH: 27.7 pg (ref 26.0–34.0)
MCHC: 31.5 g/dL (ref 30.0–36.0)
MCV: 87.8 fL (ref 80.0–100.0)
Platelets: 150 10*3/uL (ref 150–400)
RBC: 2.96 MIL/uL — ABNORMAL LOW (ref 4.22–5.81)
RDW: 18.4 % — ABNORMAL HIGH (ref 11.5–15.5)
WBC: 17.2 10*3/uL — ABNORMAL HIGH (ref 4.0–10.5)
nRBC: 1.5 % — ABNORMAL HIGH (ref 0.0–0.2)

## 2021-02-08 MED ORDER — SODIUM CHLORIDE 0.9 % IV SOLN
2.0000 g | Freq: Three times a day (TID) | INTRAVENOUS | Status: AC
  Administered 2021-02-08 – 2021-02-09 (×4): 2 g via INTRAVENOUS
  Filled 2021-02-08 (×4): qty 2

## 2021-02-08 MED ORDER — MIDODRINE HCL 5 MG PO TABS
10.0000 mg | ORAL_TABLET | Freq: Three times a day (TID) | ORAL | Status: DC
  Administered 2021-02-08 – 2021-02-09 (×2): 10 mg via ORAL
  Filled 2021-02-08 (×2): qty 2

## 2021-02-08 MED ORDER — POTASSIUM CHLORIDE 10 MEQ/100ML IV SOLN
10.0000 meq | INTRAVENOUS | Status: AC
  Administered 2021-02-08 (×2): 10 meq via INTRAVENOUS
  Filled 2021-02-08 (×2): qty 100

## 2021-02-08 MED ORDER — POTASSIUM CHLORIDE 20 MEQ PO PACK
40.0000 meq | PACK | Freq: Two times a day (BID) | ORAL | Status: DC
  Administered 2021-02-08 – 2021-02-09 (×3): 40 meq via ORAL
  Filled 2021-02-08 (×3): qty 2

## 2021-02-08 NOTE — Progress Notes (Addendum)
NAME:  Justin Solomon, MRN:  [id number], DOB:  12/20/1950, LOS: 6 ADMISSION DATE:  02/01/2021, CONSULTATION DATE: 6/1 REFERRING MD: Dr. Hal Hope TRH, CHIEF COMPLAINT: GI bleed  History of Present Illness:   Patient is encephalopathic and/or intubated. Therefore history has been obtained from chart review.   70 year old male with complicated recent past medical history significant for admission outside hospital back in March 2022 after a fall.  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   He is also unable to be liberated from the ventilator and has since undergone tracheostomy.  He was discharged to [facility] in [place] for ongoing rehab. 5/31 he presented to [facility] long ED from [facility] for evaluation of GI bleeding. Rn at [facility] noted bright red blood with clots in his brief as he was being changed. In the ED there were clots noted in the rectal area, but no active bleeding was observed. Vitals remained stable. CT abdomen concerning for large volume of stool concerning for constipation and Marked rectal distension. Also described loculated R pleural effusion. Hemoglobin 7.5.   The patient was admitted to the hospitalist service. GI was consulted and were planning for tagged RBC study, however, the patient decompensated in the early afternoon hours of 6/1. He developed respiratory depression and near respiratory arrest. PCCM was consulted.   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   Significant Hospital Events: Including procedures, antibiotic start and stop dates in addition to other pertinent events   . 5/31 admit for GIB . 6/1 transfer to ICU/shock. EGD and Flex sig performed by GI. Marland Kitchen 6/2 no transfusion . 6/4 hgb  6.5 transfused one unit with hgb up to 8.3 6/5 am  . 6/6  hemoglobin remained stable, started on Cardizem overnight and required low-dose Levophed.  Sputum with MRSA On Vanc cefepime and Flagyl day 5, abx changed to linezolid (for Corynebacterium coverage) and cefepime . 6/7 Transitioned to amiodarone, remains on low dose Levophed      Antibiotics: Zosyn 5/31-6/1 Linezolid 6/6 Flagyl 6/1- 6/5 Vancomycin 6/1- 6/6 Cefepime 6/1- Linezolid 6/6-    Micro 6/3 respiratory culture>> MRSA and Corynebacterium striatum> Cipro, erythro, oxacillin resistant 6/1 MRSA screen>> positive 6/1 COVID-19 and flu>> negative   Tubes/lines 6/1 right femoral CVC 6/1 art line    Interim History / Subjective:   Met with patient's three children yesterday evening, they report that patient was oriented and conversational less than 2 weeks ago and at the time he was admitted to [facility] was actually moving 3 out of 4 extremities slightly.  Physical therapy did not work with patient per their report after [facility] admission secondary to his c-collar.   Given that he was on trach collar and talking to them recently they would like to continue full code and aggressive care.  They are concerned about his current encephalopathy.   No overnight events, continues to require low-dose Levophed, T-max 99.9.  2700 cc urine output yesterday, glucose 130s  Objective   Blood pressure (!) 96/57, pulse 76, temperature 97.9 F (36.6 C), temperature source Oral, resp. rate (!) 24, height 6' (1.829 m), weight 96.2 kg, SpO2 100 %. CVP:  [24 mmHg] 24 mmHg  Vent Mode: PRVC FiO2 (%):  [30 %-35 %] 35 % Set Rate:  [24 bmp] 24 bmp Vt Set:  [888 mL] 620 mL PEEP:  [  [place] Pressure:  [18 cmH20-22 cmH20] 20 cmH20   Intake/Output Summary (Last 24 hours) at 02/08/2021 0805 Last data filed at 02/08/2021 0630 Gross per 24 hour  Intake 3346.83 ml  Output 2700 ml  Net 646.83 ml   Filed Weights   02/06/21 0500 02/07/21 0428 02/08/21 0337  Weight: 94.6 kg 95.6 kg 96.2 kg     General: Elderly male, acute on chronically ill-appearing with trach in place, resting with no signs of acute distress HEENT: MM pink/moist, trach in place without Neuro: opens eyes to voice today, not otherwise responsive off sedation, tracking with eyes CV: s1s2 rrr, no m/r/g PULM: Tolerating trach collar 8 L and 35%, no significant rhonchi or wheezing, no accessory muscle use or tachypnea.  Decreased air entry bilateral bases GI: soft, bsx4 active  Extremities: warm/dry, no edema  Skin: no rashes or lesions       Labs/imaging that I have personally reviewed  (right click and "Reselect all SmartList Selections" daily)   CBC-White blood cell count slightly uptrending to 17 K BMP-potassium 3.2, creatinine 1.23, glucose 134 Hepatic function LFTs downtrending  Resolved Hospital Problem list   Hemorrhagic shock  Assessment & Plan:    GI bleed with presenting hemorrhagic shock S/p EGD and flex sig on 6/1 without stigmata of bleeding, thought to be secondary to stair coral colitis and possible rectal ulcer P: -Hemoglobin stable s/p transfusion, continue to monitor for signs of bleeding and transfusion as needed -GI now signed off -Continue PPI and MiraLAX -Remains in ICU until pressors weaned off, once no longer requiring Levophed is stable for transfer out of ICU     Acute on chronic hypoxic respiratory failure with possible MRSA pneumonia and sepsis Baseline on trach collar, required full vent support during admission, now transition back to trach collar P: -Tolerating trach collar 8 L 35% FiO2 since 6/6 -4 cuffless trach was changed for Shiley cuffed #6 -Respiratory culture with MRSA and Corynebacterium, vancomycin transition to linezolid 6/6, on cefepime.  Plan to complete antibiotics 6/8 unless signs of worsening infection continue.  White blood cell count is trending up, not on steroids.  If pressor requirement remained stable today plan on discontinuing CVC -Continue  routine trach care -VAP prevention -- Lactic acid downtrending, remains 11 L positive since admission, given 500 cc bolus yesterday, can hold further IV fluid today -Continue pressors to maintain MAP>65   AKI, hypokalemia Creatinine continuing to improve 2700 cc urine output yesterday P: -Continue to monitor renal indices and electrolytes, avoid nephrotoxins as able monitor urine output -Potassium 40 M EQ twice daily   Atrial fibrillation, HTN On amiodarone, metoprolol, Norvasc, Lipitor at baseline P: -Rate controlled off medications since yesterday, hold amiodarone gtt and monitor  -Not anticoagulated at baseline, continue to hold in the setting of recent GI bleed    Elevated liver enzymes -Improving  Baseline quadriplegia Patient had a fall 11/2020 cervical fracture and subsequent epidural hematoma s/p evacuation and subsequent quadriplegia P: -Appreciate palliative care involvement, had family meeting with patient's 3 children yesterday.  It is seems that he was improving at the time he was admitted to [facility] and was alert and speaking with them up until approximately 10 days ago.  They are still hopeful he will make some recovery and would like to continue full code -Dr. Domingo Cocking spoke with patient's neurosurgery office at [facility], agree that safe to remove c-collar as 8 weeks postop     Encephalopathy Per patient's family,  this is new in the last 10 to 14 days.  CT head was negative for acute findings on 6/6.  Ammonia was normal P: -Suspect that this may be secondary to critical illness, shock, no sedating meds for last 48 hours, possibly still decreased clearance in the setting of AKI -Family mentions EEG, though very low suspicion for acute seizure activity.  Will obtain MRI and continue monitoring, consider EEG   Lung nodule - Outpatient follow up   Best practice (right click and "Reselect all SmartList Selections" daily)  Diet:  Tube Feed   Pain/Anxiety/Delirium protocol (if indicated): Yes (RASS goal 0) VAP protocol (if indicated): Yes DVT prophylaxis: SCD GI prophylaxis: PPI Glucose control:  SSI No Central venous access:  Yes, and it is still needed, once off Levophed will  discontinue Arterial line:  Yes, still needs it (on levophed) Foley:  Yes, and it is still needed Mobility:  bed rest  PT consulted: N/A Last date of multidisciplinary goals of care discussion [spoke with son on the phone on 6/1, confirmed full code status], family update pending arrival on 6/6  code Status:  full code Disposition: ICU  Labs   CBC: Recent Labs  Lab 02/01/21 2300 02/02/21 0547 02/02/21 0939 02/02/21 1726 02/04/21 0814 02/05/21 0340 02/05/21 1412 02/06/21 0500 02/06/21 0651 02/07/21 0415 02/08/21 0320  WBC 22.1*   < > 24.5*   < > 17.8* 11.7*  --  7.2  --  16.7* 17.2*  NEUTROABS 17.0*  --  17.1*  --   --   --   --   --   --   --   --   HGB 7.5*   < > 7.1*   < > 7.8* 6.5* 7.6* 3.9* 8.3* 8.3* 8.2*  HCT 26.0*   < > 24.1*   < > 24.7* 20.8* 24.7* 13.4* 26.4* 26.3* 26.0*  MCV 89.7   < > 90.6   < > 85.8 87.4  --  94.4  --  88.6 87.8  PLT 290   < > 269   < > 189 141*  --  72*  --  148* 150   < > = values in this interval not displayed.    Basic Metabolic Panel: Recent Labs  Lab 02/02/21 0939 02/02/21 1726 02/03/21 0243 02/03/21 0610 02/04/21 0814 02/05/21 0340 02/06/21 0651 02/07/21 0415 02/08/21 0320  NA 149*   < > 147*   < > 144 144 143 145 141  K 6.2*   < > 6.8*   < > 4.2 3.2* 3.1* 3.1* 3.2*  CL 115*   < > 114*   < > 113* 115* 117* 121* 114*  CO2 18*   < > 20*   < > 19* 17* 16* 17* 18*  GLUCOSE 92   < > 149*   < > 106* 119* 114* 149* 134*  BUN 98*   < > 93*   < > 109* 91* 76* 69* 59*  CREATININE 2.76*   < > 3.19*   < > 2.80* 2.25* 1.74* 1.47* 1.23  CALCIUM 10.3   < > 9.6   < > 8.5* 8.6* 8.4* 8.3* 8.1*  MG 2.8*  --  2.7*  --  2.4  --   --   --   --   PHOS 7.3*  --  9.3*  --  6.5*  --   --   --   --    < > =  values in this interval not displayed.  GFR: Estimated Creatinine Clearance: 65.3 mL/min (by C-G formula based on SCr of 1.23 mg/dL). Recent Labs  Lab 02/03/21 0513 02/03/21 1031 02/03/21 1624 02/03/21 2007 02/04/21 0037 02/05/21 0340 02/06/21 0500 02/07/21 0415 02/08/21 0320  WBC  --  21.7* 26.0*  --    < > 11.7* 7.2 16.7* 17.2*  LATICACIDVEN 5.6* 6.1* 4.0* 4.5*  --   --   --   --   --    < > = values in this interval not displayed.    Liver Function Tests: Recent Labs  Lab 02/02/21 0939 02/02/21 1726 02/03/21 0243 02/04/21 0814 02/07/21 1731  AST 219* 499* 926* 921* 367*  ALT 341* 565* 774* 850* 671*  ALKPHOS 218* 237* 214* 196* 148*  BILITOT 1.1 1.5* 1.0 1.0 0.7  PROT 7.7 7.7 7.4 6.4* 5.4*  ALBUMIN 1.8* 1.9* 1.8* 1.6* 1.7*   No results for input(s): LIPASE, AMYLASE in the last 168 hours. Recent Labs  Lab 02/07/21 1731  AMMONIA 31    ABG    Component Value Date/Time   PHART 7.346 (L) 02/03/2021 1620   PCO2ART 33.1 02/03/2021 1620   PO2ART 130 (H) 02/03/2021 1620   HCO3 17.6 (L) 02/03/2021 1620   TCO2 21 (L) 02/03/2021 0655   ACIDBASEDEF 6.8 (H) 02/03/2021 1620   O2SAT 98.6 02/03/2021 1620     Coagulation Profile: Recent Labs  Lab 02/01/21 2300 02/02/21 1726  INR 1.3* 1.7*    Cardiac Enzymes: No results for input(s): CKTOTAL, CKMB, CKMBINDEX, TROPONINI in the last 168 hours.  HbA1C: No results found for: HGBA1C  CBG: Recent Labs  Lab 02/07/21 1514 02/07/21 1923 02/07/21 2344 02/08/21 0328 02/08/21 0750  GLUCAP 162* 126* 138* 124* 132*   CRITICAL CARE Performed by: Otilio Carpen Christina Waldrop   Total critical care time: 35 minutes  Critical care time was exclusive of separately billable procedures and treating other patients.  Critical care was necessary to treat or prevent imminent or life-threatening deterioration.  Critical care was time spent personally by me on the following activities: development of treatment plan with patient and/or  surrogate as well as nursing, discussions with consultants, evaluation of patient's response to treatment, examination of patient, obtaining history from patient or surrogate, ordering and performing treatments and interventions, ordering and review of laboratory studies, ordering and review of radiographic studies, pulse oximetry and re-evaluation of patient's condition.    Otilio Carpen Marisela Line, PA-C [facility] See Amion for pager If no response to pager , please call [phone] until 7pm After 7:00 pm call Elink  [phone]?[place]

## 2021-02-08 NOTE — Progress Notes (Signed)
eLink Physician-Brief Progress Note Patient Name: Justin Solomon DOB: April 23, 1951 MRN: [id number]   Date of Service  02/08/2021  HPI/Events of Note  Patient with copious diarrhea and stage 3 decubitus ulcers.  eICU Interventions  Flexiseal ordered.        Thomasene Lot Justin Solomon 02/08/2021, 8:29 PM

## 2021-02-08 NOTE — Progress Notes (Signed)
Pharmacy Antibiotic Note  Justin Solomon is a 70 y.o. male admitted on 02/01/2021 with possible IAI, pneumonia.   02/08/21 12:05 PM  - SCr 1.23, improved, CrCl ~ 65 ml/min Tm 99.9, WBC up to 17.2 - D7 antibiotics  - TA cx MRSA & corynebacterium  Plan: Increase cefepime dose to  2 gm IVq 8 hours Continue on zyvox 600 mg IV q12h to cover both MRSA & Corynebacterium F/u length of therapy- rec stopping cefepime as no GNR have grown; consider changing zyvox to per tube  Height: 6' (182.9 cm) Weight: 96.2 kg (212 lb 1.3 oz) IBW/kg (Calculated) : 77.6  Temp (24hrs), Avg:98.4 F (36.9 C), Min:97.7 F (36.5 C), Max:99.9 F (37.7 C)  Recent Labs  Lab 02/02/21 0230 02/02/21 0547 02/03/21 0513 02/03/21 1031 02/03/21 1624 02/03/21 2007 02/04/21 0037 02/04/21 1093 02/04/21 0835 02/05/21 0340 02/06/21 0500 02/06/21 0651 02/07/21 0415 02/08/21 0320  WBC  --    < >  --  21.7* 26.0*  --    < > 17.8*  --  11.7* 7.2  --  16.7* 17.2*  CREATININE  --    < >  --   --  2.87*  --    < > 2.80*  --  2.25*  --  1.74* 1.47* 1.23  LATICACIDVEN 3.1*  --  5.6* 6.1* 4.0* 4.5*  --   --   --   --   --   --   --   --   VANCORANDOM  --   --   --   --   --   --   --   --  14  --   --   --   --   --    < > = values in this interval not displayed.    Estimated Creatinine Clearance: 65.3 mL/min (by C-G formula based on SCr of 1.23 mg/dL).    No Known Allergies  Antimicrobials this admission:  6/1 Zosyn >>6/1 6/1 cefepime 6/1 flagyl>> 6/6 6/1 Vanc >> 6/6 6/6 zyvox>> Dose adjustments this admission:  6/7 increase cefepime to q8h Microbiology results:  6/1 COVID, Influenza: neg 6/1 MRSA PCR: positive 6/3 Resp Cx: moderate MRSA, Vanc MIC = 1;  abundant corynebacterium striatum F  Thank you for allowing pharmacy to be a part of this patient's care.  Herby Abraham, Pharm.D 02/08/2021 12:05 PM

## 2021-02-08 NOTE — Progress Notes (Signed)
Daily Progress Note   Patient Name: Justin Solomon       Date: 02/08/2021 DOB: [birth year]  Age: 70 y.o. MRN#: [id number] Attending Physician: Margaretha Seeds, MD Primary Care Physician: Aaron Edelman, MD Admit Date: 02/01/2021  Reason for Consultation/Follow-up: Establishing goals of care  Subjective: I saw and examined Justin Solomon today.  He remains unresponsive and does not interact with me today.  Discussed with son and daughter at the bedside.  Reviewed plan for imaging of head and neck.  Discussed concern regarding continued unresponsiveness and my worry that this may not improve.  We also discussed plan to reach out to neurosurgery at [facility] to discuss c-collar.    I was able today to discuss with Enid Baas PA from Dr. Saul Fordyce office.  He updated me that Justin Solomon had been making progress in regard to UE movement prior to discharge in April.  Discussed c-collar, and, as it has been 8 weeks since surgery, he reports that they would be OK removing at this point.  Length of Stay: 6  Current Medications: Scheduled Meds:  . amiodarone  150 mg Intravenous Once  . chlorhexidine gluconate (MEDLINE KIT)  15 mL Mouth Rinse BID  . Chlorhexidine Gluconate Cloth  6 each Topical Daily  . docusate  100 mg Per Tube BID  . feeding supplement (PROSource TF)  45 mL Per Tube BID  . free water  50 mL Per Tube Q4H  . mouth rinse  15 mL Mouth Rinse 10 times per day  . metoprolol tartrate  25 mg Per Tube BID  . pantoprazole (PROTONIX) IV  40 mg Intravenous Q12H  . polyethylene glycol  17 g Per Tube Daily  . potassium chloride  40 mEq Oral Daily  . sodium chloride flush  10-40 mL Intracatheter Q12H    Continuous Infusions: . sodium chloride 10 mL/hr at 02/08/21 0622  .  sodium chloride 10 mL/hr at 02/08/21 0622  . amiodarone Stopped (02/08/21 0124)  . ceFEPime (MAXIPIME) IV Stopped (02/07/21 2200)  . feeding supplement (OSMOLITE 1.5 CAL) 60 mL/hr at 02/08/21 0552  . linezolid (ZYVOX) IV Stopped (02/07/21 2225)  . norepinephrine (LEVOPHED) Adult infusion 5 mcg/min (02/08/21 0622)    PRN Meds: acetaminophen **OR** acetaminophen, sodium chloride flush  Physical Exam   General: Cachectic elderly appearing  male in no distress, trach and c-collar Lungs: Clear bilateral with no wheezing Cardiovascular: Regular no murmur rubs or gallops Abdomen: PEG in place, soft, positive bowel sounds Extremity: Thin with contractures noted Neuro: Unresponsive      Vital Signs: BP (!) 81/47   Pulse 71   Temp 97.9 F (36.6 C) (Oral)   Resp (!) 25   Ht 6' (1.829 m)   Wt 96.2 kg   SpO2 100%   BMI 28.76 kg/m  SpO2: SpO2: 100 % O2 Device: O2 Device: Ventilator O2 Flow Rate: O2 Flow Rate (L/min): 8 L/min  Intake/output summary:   Intake/Output Summary (Last 24 hours) at 02/08/2021 7614 Last data filed at 02/08/2021 7092 Gross per 24 hour  Intake 3412.31 ml  Output 2700 ml  Net 712.31 ml   LBM: Last BM Date: 02/06/21 Baseline Weight: Weight: 104.8 kg Most recent weight: Weight: 96.2 kg       Palliative Assessment/Data:    Flowsheet Rows   Flowsheet Row Most Recent Value  Intake Tab   Referral Department Hospitalist  Unit at Time of Referral ICU  Palliative Care Primary Diagnosis Other (Comment)  [GI]  Date Notified 02/03/21  Palliative Care Type New Palliative care  Reason for referral Clarify Goals of Care  Date of Admission 02/01/21  Date first seen by Palliative Care 02/03/21  # of days Palliative referral response time 0 Day(s)  # of days IP prior to Palliative referral 2  Clinical Assessment   Palliative Performance Scale Score 10%  Psychosocial & Spiritual Assessment   Palliative Care Outcomes   Patient/Family meeting held? Yes  Who was at  the meeting? Son via phone  Palliative Care Outcomes Clarified goals of care      Patient Active Problem List   Diagnosis Date Noted  . Encephalopathy   . Acute and chronic respiratory failure with hypoxia ([place]) 02/06/2021  . Pressure injury of skin 02/03/2021  . Rectal bleeding 02/02/2021  . Quadriparesis ([place]) 02/02/2021  . Atrial fibrillation ([place]) 02/02/2021  . ARF (acute renal failure) ([place]) 02/02/2021  . Essential hypertension 02/02/2021  . Acute blood loss anemia 02/02/2021  . Acute bleeding 02/02/2021  . Hemorrhagic shock [facility])     Palliative Care Assessment & Plan   Patient Profile: 70 year old with complicated history including quadriplegia, trach, and PEG who was admitted for GI bleeding  Recommendations/Plan:  Full code/full scope treatment  Continue current care.  Patient remains unresponsive today.  Family remains invested in plan to continue with aggressive care and are open to any and all offered interventions.  AMS: On 6/4, daughter reported being concerned with change in mental status as she feels that he has some change to his face with with drooping in his right eye.  He is still not responsive enough to participate in any kind of neuro exam.  We discussed that this thought to be most likely encephalopathy related to metabolic issues, infection, and other issues.  Plan is for CT of head today.  Family questioning if there are other options at discharge other than going back to [facility].  Defer this to TOC.  Palliative care to continue to follow.  Goals of Care and Additional Recommendations:  Limitations on Scope of Treatment: Full Scope Treatment  Code Status:    Code Status Orders  (From admission, onward)         Start     Ordered   02/02/21 0216  Full code  Continuous  02/02/21 0216        Code Status History    This patient has a current code status but no historical code status.   Advance Care Planning Activity        Prognosis: Guarded  Discharge Planning:  To Be Determined  Thank you for allowing the Palliative Medicine Team to assist in the care of this patient.  Total Time 1500-1540 Time: 40 minutes Prolonged Time Billed No   Greater than 50%  of this time was spent counseling and coordinating care related to the above assessment and plan.  Micheline Rough, MD  Please contact Palliative Medicine Team phone at [phone] for questions and concerns.

## 2021-02-09 ENCOUNTER — Inpatient Hospital Stay (HOSPITAL_COMMUNITY): Payer: Medicare Other

## 2021-02-09 DIAGNOSIS — D649 Anemia, unspecified: Secondary | ICD-10-CM

## 2021-02-09 LAB — GLUCOSE, CAPILLARY
Glucose-Capillary: 111 mg/dL — ABNORMAL HIGH (ref 70–99)
Glucose-Capillary: 114 mg/dL — ABNORMAL HIGH (ref 70–99)
Glucose-Capillary: 118 mg/dL — ABNORMAL HIGH (ref 70–99)
Glucose-Capillary: 120 mg/dL — ABNORMAL HIGH (ref 70–99)
Glucose-Capillary: 123 mg/dL — ABNORMAL HIGH (ref 70–99)
Glucose-Capillary: 124 mg/dL — ABNORMAL HIGH (ref 70–99)
Glucose-Capillary: 128 mg/dL — ABNORMAL HIGH (ref 70–99)

## 2021-02-09 LAB — BASIC METABOLIC PANEL
Anion gap: 5 (ref 5–15)
BUN: 56 mg/dL — ABNORMAL HIGH (ref 8–23)
CO2: 19 mmol/L — ABNORMAL LOW (ref 22–32)
Calcium: 8.5 mg/dL — ABNORMAL LOW (ref 8.9–10.3)
Chloride: 121 mmol/L — ABNORMAL HIGH (ref 98–111)
Creatinine, Ser: 1.22 mg/dL (ref 0.61–1.24)
GFR, Estimated: >60 mL/min (ref >60)
Glucose, Bld: 135 mg/dL — ABNORMAL HIGH (ref 70–99)
Potassium: 4.8 mmol/L (ref 3.5–5.1)
Sodium: 145 mmol/L (ref 135–145)

## 2021-02-09 LAB — CBC
HCT: 25.9 % — ABNORMAL LOW (ref 39.0–52.0)
Hemoglobin: 8 g/dL — ABNORMAL LOW (ref 13.0–17.0)
MCH: 27.6 pg (ref 26.0–34.0)
MCHC: 30.9 g/dL (ref 30.0–36.0)
MCV: 89.3 fL (ref 80.0–100.0)
Platelets: 140 10*3/uL — ABNORMAL LOW (ref 150–400)
RBC: 2.9 MIL/uL — ABNORMAL LOW (ref 4.22–5.81)
RDW: 19.7 % — ABNORMAL HIGH (ref 11.5–15.5)
WBC: 16.8 10*3/uL — ABNORMAL HIGH (ref 4.0–10.5)
nRBC: 0.5 % — ABNORMAL HIGH (ref 0.0–0.2)

## 2021-02-09 LAB — CORTISOL: Cortisol, Plasma: 16.2 ug/dL

## 2021-02-09 MED ORDER — POTASSIUM CHLORIDE 20 MEQ PO PACK: 40.0000 meq | PACK | Freq: Every day | ORAL | Status: DC

## 2021-02-09 MED ORDER — CHLORHEXIDINE GLUCONATE CLOTH 2 % EX PADS: 6.0000 | MEDICATED_PAD | Freq: Every day | CUTANEOUS | Status: DC

## 2021-02-09 MED ORDER — CHLORHEXIDINE GLUCONATE 0.12 % MT SOLN: 15.0000 mL | Freq: Two times a day (BID) | OROMUCOSAL | Status: DC

## 2021-02-09 MED ORDER — ORAL CARE MOUTH RINSE: 15.0000 mL | Freq: Two times a day (BID) | OROMUCOSAL | Status: DC

## 2021-02-09 MED ORDER — HYDROCORTISONE NA SUCCINATE PF 100 MG IJ SOLR
50.0000 mg | Freq: Four times a day (QID) | INTRAMUSCULAR | Status: DC
  Administered 2021-02-09 – 2021-02-11 (×6): 50 mg via INTRAVENOUS
  Filled 2021-02-09 (×6): qty 2

## 2021-02-09 MED ORDER — COLLAGENASE 250 UNIT/GM EX OINT
TOPICAL_OINTMENT | Freq: Every day | CUTANEOUS | Status: DC
  Administered 2021-02-10 – 2021-02-13 (×4): 1 via TOPICAL
  Filled 2021-02-09 (×2): qty 30

## 2021-02-09 MED ORDER — ALBUMIN HUMAN 25 % IV SOLN
25.0000 g | Freq: Once | INTRAVENOUS | Status: AC
  Administered 2021-02-09: 25 g via INTRAVENOUS
  Filled 2021-02-09: qty 100

## 2021-02-09 MED ORDER — ACETAMINOPHEN 650 MG RE SUPP: 650.0000 mg | Freq: Four times a day (QID) | RECTAL | Status: DC | PRN

## 2021-02-09 MED ORDER — AMIODARONE HCL IN DEXTROSE 360-4.14 MG/200ML-% IV SOLN
30.0000 mg/h | INTRAVENOUS | Status: DC
  Administered 2021-02-09 – 2021-02-10 (×2): 30 mg/h via INTRAVENOUS
  Filled 2021-02-09 (×2): qty 200

## 2021-02-09 MED ORDER — AMIODARONE HCL IN DEXTROSE 360-4.14 MG/200ML-% IV SOLN
60.0000 mg/h | INTRAVENOUS | Status: AC
  Administered 2021-02-09 (×2): 60 mg/h via INTRAVENOUS
  Filled 2021-02-09 (×2): qty 200

## 2021-02-09 MED ORDER — AMIODARONE LOAD VIA INFUSION
150.0000 mg | Freq: Once | INTRAVENOUS | Status: AC
  Administered 2021-02-09: 150 mg via INTRAVENOUS
  Filled 2021-02-09: qty 83.34

## 2021-02-09 MED ORDER — MIDODRINE HCL 5 MG PO TABS
10.0000 mg | ORAL_TABLET | Freq: Three times a day (TID) | ORAL | Status: DC
  Administered 2021-02-09 – 2021-02-10 (×3): 10 mg
  Filled 2021-02-09 (×3): qty 2

## 2021-02-09 MED ORDER — ACETAMINOPHEN 325 MG PO TABS: 650.0000 mg | ORAL_TABLET | Freq: Four times a day (QID) | ORAL | Status: DC | PRN

## 2021-02-09 NOTE — Progress Notes (Signed)
eLink Physician-Brief Progress Note Patient Name: Justin Solomon DOB: 04-10-1951 MRN: [id number]   Date of Service  02/09/2021  HPI/Events of Note  Patient with paroxysmal Afib with RVR, Cardizem gtt was recently discontinued due to hypotension with plan to start Amiodarone gtt if Afib recurs.  eICU Interventions  Amiodarone bolus + infusion ordered.        Thomasene Lot Justin Solomon 02/09/2021, 2:42 AM

## 2021-02-09 NOTE — Progress Notes (Signed)
Patient transferred from [facility] to 4NICU

## 2021-02-09 NOTE — Progress Notes (Signed)
NAME:  Justin Solomon, MRN:  [id number], DOB:  [birth year], LOS: 7 ADMISSION DATE:  02/01/2021, CONSULTATION DATE: 6/1 REFERRING MD: Dr. Toniann Fail TRH, CHIEF COMPLAINT: GI bleed  Brief History:  70 year old male with complicated recent past medical history significant for admission outside hospital back in March 2022 after a fall.  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   He is also unable to be liberated from the ventilator and has since undergone tracheostomy.  He was discharged to [facility] [place] for ongoing rehab. On 5/31 he presented to [facility] ER from [facility] for evaluation of GI bleeding. [facility] at [facility] noted bright red blood with clots in his brief as he was being changed. In the ED there were clots noted in the rectal area, but no active bleeding was observed. Vitals remained stable. CT abdomen concerning for large volume of stool concerning for constipation and marked rectal distension. Also described loculated R pleural effusion. Hemoglobin 7.5.   The patient was admitted to the hospitalist service. GI was consulted and were planning for tagged RBC study, however, the patient decompensated in the early afternoon hours of 6/1. He developed respiratory depression and near respiratory arrest. PCCM was consulted.   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   Significant Hospital Events: Including procedures, antibiotic start and stop dates in addition to other pertinent events   . 5/31 admit for GIB . 6/1 transfer to ICU/shock. EGD and Flex sig performed by GI. Marland Kitchen 6/2 no transfusion . 6/4 hgb  6.5 transfused one unit with hgb up to 8.3 6/5 am  . 6/6 hemoglobin remained stable, started on Cardizem overnight and required low-dose Levophed.  Sputum with MRSA On Vanc cefepime  and Flagyl day 5, abx changed to linezolid (for Corynebacterium coverage) and cefepime . 6/7 Transitioned to amiodarone, remains on low dose Levophed  . 6/8 On amiodarone, off levophed around 1050 am.  MRI pending. Tracking provider / eyes open    Antibiotics: Zosyn 5/31-6/1 Linezolid 6/6 Flagyl 6/1- 6/5 Vancomycin 6/1- 6/6 Cefepime 6/1 >> Linezolid 6/6 >>  Micro Respiratory culture 6/3 >> MRSA and Corynebacterium striatum> Cipro, erythro, oxacillin resistant MRSA screen 6/1 >> positive COVID-19 and flu 6/1 >> negative  Tubes/lines Right femoral CVC 6/1 >> Art line 6/1 >>  Interim History / Subjective:  On 1 mcg levophed, turned off ~ 1050 am Amiodarone infusion continues.  AF on monitor  Afebrile  On vent overnight, ATC 35% currently.  RT suctioned white frothy sputum.  Glucose control 113-135 I/O 1.8L UOP, 1.4L + in last 24 hours   Objective   Blood pressure (!) 79/45, pulse (!) 116, temperature 98.8 F (37.1 C), temperature source Oral, resp. rate 20, height 6' (1.829 m), weight 97.6 kg, SpO2 100 %.    Vent Mode: PRVC FiO2 (%):  [30 %-35 %] 30 % Set Rate:  [24 bmp] 24 bmp Vt Set:  [620 mL] 620 mL PEEP:  [5 cmH20] 5 cmH20 Plateau Pressure:  [19 cmH20-22 cmH20] 22 cmH20   Intake/Output Summary (Last 24 hours) at 02/09/2021 1034 Last data filed at 02/09/2021 1000 Gross per 24 hour  Intake 3921.07 ml  Output 1975 ml  Net 1946.07 ml   Filed Weights   02/07/21 0428 02/08/21 0337 02/09/21 0337  Weight: 95.6 kg 96.2 kg 97.6 kg  General: chronically ill appearing adult male lying in bed in NAD  HEENT: MM pink/moist, #6 trach midline c/d/i Neuro: eyes open, tracks provider, no follow commands CV: s1s2 irr irr, AF on monitor (rate controlled), no m/r/g PULM: non-labored on ATC, lungs bilaterally with rhonchi GI: soft, bsx4 active, PEG in place c/d/i Extremities: warm/dry, 1+ BLE pitting edema, dependent edema in BUE's  Skin: no rashes or lesions  Labs/imaging that  I have personally reviewed  (right click and "Reselect all SmartList Selections" daily)  CBC - platelets 140  BMP - CO2 19, BUn 56, Sr Cr 1.22  Micro  I/O's  PCXR 6/7 - cervical hardware noted, trach in good position, right base atx with small effusion   Resolved Hospital Problem list   Hemorrhagic shock  Assessment & Plan:     GI bleed Presented with shock. S/p EGD and flex sig on 6/1 without stigmata of bleeding, EGD showed mild esophagitis, gastritis & few non-bleeding ulcers. Flex sig with poor prep, solid stool / no bleeding, possible rectal ulcer. GI signed off.  -follow Hgb/Hct  -transfuse for Hgb <7% -continue PPI -continue miralax   Hypotension  -lopressor stopped 6/7 -assess cortisol  -continue midodrine  -monitor off levophed  Acute on chronic hypoxic respiratory failure with possible MRSA pneumonia and sepsis Baseline on trach collar, required full vent support during admission, now transition back to trach collar -continue ATC 8L/35%, has been on ATC since 6/6  -continue #6 cuffed Shiley  -continue linezolid, cefepime through 6/8, stop after as on D8 abx  -trach care per protocol  -pulmonary hygiene - tracheal suction PRN  AKI Hypokalemia -Trend BMP / urinary output -Replace electrolytes as indicated -Avoid nephrotoxic agents, ensure adequate renal perfusion -continue free water  -decrease scheduled K to QD  Atrial fibrillation HTN On amiodarone, metoprolol, Norvasc, Lipitor at baseline -monitor off metoprolol  -continue amiodarone infusion  -not anticoagulated at baseline, continue to hold / not a candidate with GIB   Elevated liver enzymes -follow LFT's  Baseline Quadriplegia Patient had a fall 11/2020 cervical fracture and subsequent epidural hematoma s/p evacuation and subsequent quadriplegia. C-Collar removed 6/7 after review with patient's neurosurgeon.  -appreciate Palliative Care input  -family requests full code > last family meeting family  reported patient was awake prior to admit, hopeful for recovery    Encephalopathy Per patient's family, this is new in the last 10 to 14 days.  CT head was negative for acute findings on 6/6.  Ammonia was normal. Suspect secondary to critical illness, shock and AKI  -supportive care  -follow up MRI  -pt opens eyes, tracks on exam.  Consider EEG if persistent.   Lung nodule -will need outpatient follow up   At Risk Malnutrition  -TF via PEG   Best practice (right click and "Reselect all SmartList Selections" daily)  Diet:  Tube Feed  Pain/Anxiety/Delirium protocol (if indicated): No VAP protocol (if indicated): Yes DVT prophylaxis: SCD GI prophylaxis: PPI Glucose control:  SSI No Central venous access:  Yes, and it is still needed   Arterial line: No Foley:  Yes, and it is still needed Mobility:  bed rest  PT consulted: N/A Last date of multidisciplinary goals of care discussion: 6/1, son confirmed full code status.  code Status:  full code Disposition: ICU  Critical Care Time: 32 minutes    Canary Brim, MSN, APRN, NP-C, AGACNP-BC [facility] 02/09/2021, 11:22 AM   Please see [email or web address] for pager details.   From 7A-7P  if no response, please call [phone] After hours, please call ELink [phone]

## 2021-02-09 NOTE — Progress Notes (Signed)
eLink Physician-Brief Progress Note Patient Name: Justin Solomon DOB: 04-Oct-1950 MRN: [id number]   Date of Service  02/09/2021  HPI/Events of Note  RT called to clarify  Whether it was okay to transport patient Justin Solomon to [facility] on tracheostomy collar, patient has been on trach collar all day without difficulty.  eICU Interventions  Okay to transport patient to [facility] on trach collsr.        Thomasene Lot Merland Holness 02/09/2021, 10:11 PM

## 2021-02-09 NOTE — Consult Note (Addendum)
WOC Nurse Consult Note: Patient receiving care in [facility].  Assisted with turning by primary RN. Reason for Consult: worsening wound on bottom.  Primary RN reports that every time they remove the foam dressing, more skin comes off the area. Wound type: Left buttock/lower left sacrum has an evolving DTPI with much of the overlying skin now off.  Within the area there is a darkened area that is now unstageable.  The entire area measures 10 cm x 11 cm. The right buttock/right lower sacrum also has an evolving DTPI with areas of non-intact skin.  This area measures 9 cm x 5 cm.  There is also an area that appears to be moving towards becoming an unstageable PI, but it is not as advanced as the one on the left buttock. Also, there is a DTPI with intact overlying skin at the left scapula area that measures 8 cm x 11 cm.  And a very small healing area on the right scapula that is currently only an area of hypopigmentation. Pressure Injury POA: No Measurement: Wound bed: Drainage (amount, consistency, odor)  Periwound: Dressing procedure/placement/frequency: For the right buttock and left scapula, vaseline gauze and foam dressings daily. For the left buttock, santyl and saline moistened gauze and ABD pad daily.  Patient is on an ICU mattress and staff have a Flexiseal fecal containment system in place.  Helmut Muster, RN, MSN, CWOCN, CNS-BC, pager [phone]

## 2021-02-09 NOTE — Progress Notes (Addendum)
Patients MRI reviewed with Neurology.  Concern for acute embolic CVA, hydrocephalus on imaging. Patient's family updated on MRI findings.   Plan: -transfer to [facility] for stroke evaluation  -will give albumin for volume expansion (is 13L + for admit / 1.4L positive in last 24 hours) -anticipate ECHO per Neurology -will defer need for NSGY evaluation to Neurology      Canary Brim, MSN, APRN, NP-C, AGACNP-BC [facility] 02/09/2021, 3:49 PM   Please see [email or web address] for pager details.   From 7A-7P if no response, please call [phone] After hours, please call ELink [phone]

## 2021-02-09 NOTE — Progress Notes (Addendum)
eLink Physician-Brief Progress Note Patient Name: Justin Solomon DOB: 1951-01-25 MRN: [id number]   Date of Service  02/09/2021  HPI/Events of Note  Patient transferred from [facility] to [address] [facility] at [facility] for neurology evaluation of new onset embolic strokes on brain MRI.  eICU Interventions  I paged the Neuro-hospitalist Dr. Derry Lory who called me back and indicated that he will see the patient and put in stroke orders.. New  Patient Evaluation.         Thomasene Lot Daveah Varone 02/09/2021, 11:32 PM

## 2021-02-09 NOTE — Progress Notes (Signed)
  Amiodarone Drug - Drug Interaction Consult Note  Recommendations: No interactions noted.  Will continue to monitor for any medication changes.  Amiodarone is metabolized by the cytochrome P450 system and therefore has the potential to cause many drug interactions. Amiodarone has an average plasma half-life of 50 days (range 20 to 100 days).   There is potential for drug interactions to occur several weeks or months after stopping treatment and the onset of drug interactions may be slow after initiating amiodarone.   []  Statins: Increased risk of myopathy. Simvastatin- restrict dose to 20mg  daily. Other statins: counsel patients to report any muscle pain or weakness immediately.  []  Anticoagulants: Amiodarone can increase anticoagulant effect. Consider warfarin dose reduction. Patients should be monitored closely and the dose of anticoagulant altered accordingly, remembering that amiodarone levels take several weeks to stabilize.  []  Antiepileptics: Amiodarone can increase plasma concentration of phenytoin, the dose should be reduced. Note that small changes in phenytoin dose can result in large changes in levels. Monitor patient and counsel on signs of toxicity.  []  Beta blockers: increased risk of bradycardia, AV block and myocardial depression. Sotalol - avoid concomitant use.  []   Calcium channel blockers (diltiazem and verapamil): increased risk of bradycardia, AV block and myocardial depression.  []   Cyclosporine: Amiodarone increases levels of cyclosporine. Reduced dose of cyclosporine is recommended.  []  Digoxin dose should be halved when amiodarone is started.  []  Diuretics: increased risk of cardiotoxicity if hypokalemia occurs.  []  Oral hypoglycemic agents (glyburide, glipizide, glimepiride): increased risk of hypoglycemia. Patient's glucose levels should be monitored closely when initiating amiodarone therapy.   []  Drugs that prolong the QT interval:  Torsades de pointes risk  may be increased with concurrent use - avoid if possible.  Monitor QTc, also keep magnesium/potassium WNL if concurrent therapy can't be avoided. Antibiotics: e.g. fluoroquinolones, erythromycin. . Antiarrhythmics: e.g. quinidine, procainamide, disopyramide, sotalol. . Antipsychotics: e.g. phenothiazines, haloperidol.  . Lithium, tricyclic antidepressants, and methadone. Thank You,   PharmD 02/09/2021 2:49 AM

## 2021-02-09 NOTE — Plan of Care (Signed)
Discussed with son Nicoli over the phone plan of care for the evening, MRI and medications with some teach back displayed.  Problem: Education: Goal: Knowledge of General Education information will improve Description: Including pain rating scale, medication(s)/side effects and non-pharmacologic comfort measures Outcome: Progressing

## 2021-02-09 NOTE — Progress Notes (Signed)
   02/09/21 2246  Therapy Vitals  Pulse Rate 70  Resp (!) 22  Patient Position (if appropriate) Lying  MEWS Score/Color  MEWS Score 3  MEWS Score Color Yellow  Respiratory Assessment  Assessment Type Assess only  Respiratory Pattern Regular;Unlabored  Chest Assessment Chest expansion symmetrical  Cough Congested  Bilateral Breath Sounds Diminished  Oxygen Therapy/Pulse Ox  O2 Device Tracheostomy Collar  O2 Therapy Oxygen humidified  O2 Flow Rate (L/min) 10 L/min  FiO2 (%) 40 %  SpO2 93 %  Tracheostomy Shiley Flexible 6 mm Cuffed  Placement Date/Time: 02/02/21 1243   Placed By: ICU physician  Brand: Shiley Flexible  Size (mm): 6 mm  Style: Cuffed  Status Secured  Site Assessment Dry;Clean  Site Care Dressing applied;Dried;Cleansed  Inner Cannula Care (S)  Cleansed/dried;Changed/new  Ties Assessment Dry;Secure;Clean  Tracheostomy Equipment at bedside Yes and checklist posted at head of bed  Respiratory  Airway LDA Tracheostomy  Pt. Arrived from [place] on trach collar pt. Placed on trach collar per md order pt. Tolerating trach collar.

## 2021-02-10 ENCOUNTER — Inpatient Hospital Stay (HOSPITAL_COMMUNITY): Payer: Medicare Other

## 2021-02-10 DIAGNOSIS — J9601 Acute respiratory failure with hypoxia: Secondary | ICD-10-CM

## 2021-02-10 DIAGNOSIS — I6389 Other cerebral infarction: Secondary | ICD-10-CM

## 2021-02-10 DIAGNOSIS — I6330 Cerebral infarction due to thrombosis of unspecified cerebral artery: Secondary | ICD-10-CM | POA: Insufficient documentation

## 2021-02-10 DIAGNOSIS — I480 Paroxysmal atrial fibrillation: Secondary | ICD-10-CM

## 2021-02-10 DIAGNOSIS — K625 Hemorrhage of anus and rectum: Secondary | ICD-10-CM

## 2021-02-10 LAB — COMPREHENSIVE METABOLIC PANEL
ALT: 365 U/L — ABNORMAL HIGH (ref 0–44)
AST: 81 U/L — ABNORMAL HIGH (ref 15–41)
Albumin: 2 g/dL — ABNORMAL LOW (ref 3.5–5.0)
Alkaline Phosphatase: 159 U/L — ABNORMAL HIGH (ref 38–126)
Anion gap: 7 (ref 5–15)
BUN: 50 mg/dL — ABNORMAL HIGH (ref 8–23)
CO2: 19 mmol/L — ABNORMAL LOW (ref 22–32)
Calcium: 8.7 mg/dL — ABNORMAL LOW (ref 8.9–10.3)
Chloride: 118 mmol/L — ABNORMAL HIGH (ref 98–111)
Creatinine, Ser: 1.03 mg/dL (ref 0.61–1.24)
GFR, Estimated: >60 mL/min (ref >60)
Glucose, Bld: 137 mg/dL — ABNORMAL HIGH (ref 70–99)
Potassium: 6.4 mmol/L (ref 3.5–5.1)
Sodium: 144 mmol/L (ref 135–145)
Total Bilirubin: 0.6 mg/dL (ref 0.3–1.2)
Total Protein: 6.5 g/dL (ref 6.5–8.1)

## 2021-02-10 LAB — CBC
HCT: 30.3 % — ABNORMAL LOW (ref 39.0–52.0)
Hemoglobin: 9.1 g/dL — ABNORMAL LOW (ref 13.0–17.0)
MCH: 27.8 pg (ref 26.0–34.0)
MCHC: 30 g/dL (ref 30.0–36.0)
MCV: 92.7 fL (ref 80.0–100.0)
Platelets: 205 10*3/uL (ref 150–400)
RBC: 3.27 MIL/uL — ABNORMAL LOW (ref 4.22–5.81)
RDW: 20.8 % — ABNORMAL HIGH (ref 11.5–15.5)
WBC: 29.9 10*3/uL — ABNORMAL HIGH (ref 4.0–10.5)
nRBC: 0.3 % — ABNORMAL HIGH (ref 0.0–0.2)

## 2021-02-10 LAB — ECHOCARDIOGRAM COMPLETE
AR max vel: 2.1 cm2
AV Area VTI: 2.26 cm2
AV Area mean vel: 2.07 cm2
AV Mean grad: 3.7 mmHg
AV Peak grad: 7.6 mmHg
Ao pk vel: 1.38 m/s
Area-P 1/2: 2.11 cm2
Height: 72 in
S' Lateral: 2.5 cm
Weight: 3442.7 oz

## 2021-02-10 LAB — GLUCOSE, CAPILLARY
Glucose-Capillary: 134 mg/dL — ABNORMAL HIGH (ref 70–99)
Glucose-Capillary: 176 mg/dL — ABNORMAL HIGH (ref 70–99)
Glucose-Capillary: 210 mg/dL — ABNORMAL HIGH (ref 70–99)
Glucose-Capillary: 185 mg/dL — ABNORMAL HIGH (ref 70–99)
Glucose-Capillary: 131 mg/dL — ABNORMAL HIGH (ref 70–99)
Glucose-Capillary: 92 mg/dL (ref 70–99)
Glucose-Capillary: 144 mg/dL — ABNORMAL HIGH (ref 70–99)

## 2021-02-10 LAB — POCT I-STAT 7, (LYTES, BLD GAS, ICA,H+H)
Acid-base deficit: 11 mmol/L — ABNORMAL HIGH (ref 0.0–2.0)
Bicarbonate: 16.1 mmol/L — ABNORMAL LOW (ref 20.0–28.0)
Calcium, Ion: 1.43 mmol/L — ABNORMAL HIGH (ref 1.15–1.40)
HCT: 27 % — ABNORMAL LOW (ref 39.0–52.0)
Hemoglobin: 9.2 g/dL — ABNORMAL LOW (ref 13.0–17.0)
O2 Saturation: 96 %
Patient temperature: 94.5
Potassium: 5.9 mmol/L — ABNORMAL HIGH (ref 3.5–5.1)
Sodium: 149 mmol/L — ABNORMAL HIGH (ref 135–145)
TCO2: 17 mmol/L — ABNORMAL LOW (ref 22–32)
pCO2 arterial: 38.4 mmHg (ref 32.0–48.0)
pH, Arterial: 7.219 — ABNORMAL LOW (ref 7.350–7.450)
pO2, Arterial: 86 mmHg (ref 83.0–108.0)

## 2021-02-10 LAB — POTASSIUM
Potassium: 5.3 mmol/L — ABNORMAL HIGH (ref 3.5–5.1)
Potassium: 4.9 mmol/L (ref 3.5–5.1)

## 2021-02-10 LAB — VITAMIN B12: Vitamin B-12: 1471 pg/mL — ABNORMAL HIGH (ref 180–914)

## 2021-02-10 LAB — BASIC METABOLIC PANEL
Anion gap: 6 (ref 5–15)
BUN: 56 mg/dL — ABNORMAL HIGH (ref 8–23)
CO2: 16 mmol/L — ABNORMAL LOW (ref 22–32)
Calcium: 8.7 mg/dL — ABNORMAL LOW (ref 8.9–10.3)
Chloride: 121 mmol/L — ABNORMAL HIGH (ref 98–111)
Creatinine, Ser: 1.11 mg/dL (ref 0.61–1.24)
GFR, Estimated: >60 mL/min (ref >60)
Glucose, Bld: 249 mg/dL — ABNORMAL HIGH (ref 70–99)
Potassium: 5.5 mmol/L — ABNORMAL HIGH (ref 3.5–5.1)
Sodium: 143 mmol/L (ref 135–145)

## 2021-02-10 LAB — TSH: TSH: 2.835 u[IU]/mL (ref 0.350–4.500)

## 2021-02-10 LAB — FOLATE: Folate: 11.8 ng/mL (ref >5.9)

## 2021-02-10 LAB — LDL CHOLESTEROL, DIRECT: Direct LDL: 30.1 mg/dL (ref 0–99)

## 2021-02-10 MED ORDER — MIDODRINE HCL 5 MG PO TABS
20.0000 mg | ORAL_TABLET | Freq: Three times a day (TID) | ORAL | Status: DC
  Administered 2021-02-10 (×2): 20 mg
  Filled 2021-02-10 (×3): qty 4

## 2021-02-10 MED ORDER — LACTATED RINGERS IV BOLUS
1000.0000 mL | Freq: Once | INTRAVENOUS | Status: AC
  Administered 2021-02-10: 1000 mL via INTRAVENOUS

## 2021-02-10 MED ORDER — PERFLUTREN LIPID MICROSPHERE
1.0000 mL | INTRAVENOUS | Status: AC | PRN
  Administered 2021-02-10: 4 mL via INTRAVENOUS
  Filled 2021-02-10: qty 10

## 2021-02-10 MED ORDER — FUROSEMIDE 10 MG/ML IJ SOLN
40.0000 mg | Freq: Once | INTRAMUSCULAR | Status: AC
  Administered 2021-02-10: 40 mg via INTRAVENOUS
  Filled 2021-02-10: qty 4

## 2021-02-10 MED ORDER — ALBUMIN HUMAN 25 % IV SOLN
25.0000 g | Freq: Once | INTRAVENOUS | Status: AC
  Administered 2021-02-10: 25 g via INTRAVENOUS
  Filled 2021-02-10: qty 100

## 2021-02-10 MED ORDER — SODIUM CHLORIDE 0.9 % IV SOLN
INTRAVENOUS | Status: DC | PRN
  Administered 2021-02-10: 250 mL via INTRAVENOUS

## 2021-02-10 MED ORDER — SODIUM ZIRCONIUM CYCLOSILICATE 5 G PO PACK
5.0000 g | PACK | Freq: Once | ORAL | Status: AC
  Administered 2021-02-10: 5 g via ORAL
  Filled 2021-02-10: qty 1

## 2021-02-10 MED ORDER — INSULIN ASPART 100 UNIT/ML IJ SOLN
0.0000 [IU] | INTRAMUSCULAR | Status: DC
  Administered 2021-02-10: 2 [IU] via SUBCUTANEOUS
  Administered 2021-02-10 – 2021-02-11 (×4): 1 [IU] via SUBCUTANEOUS
  Administered 2021-02-11: 2 [IU] via SUBCUTANEOUS
  Administered 2021-02-11 – 2021-02-15 (×8): 1 [IU] via SUBCUTANEOUS

## 2021-02-10 MED ORDER — LINEZOLID 600 MG/300ML IV SOLN
600.0000 mg | Freq: Two times a day (BID) | INTRAVENOUS | Status: AC
  Administered 2021-02-10 – 2021-02-11 (×4): 600 mg via INTRAVENOUS
  Filled 2021-02-10 (×4): qty 300

## 2021-02-10 MED ORDER — SODIUM ZIRCONIUM CYCLOSILICATE 10 G PO PACK
10.0000 g | PACK | Freq: Once | ORAL | Status: DC
  Filled 2021-02-10: qty 1

## 2021-02-10 MED ORDER — SODIUM CHLORIDE 0.9 % IV SOLN: INTRAVENOUS | Status: DC | PRN

## 2021-02-10 NOTE — Progress Notes (Signed)
eLink Physician-Brief Progress Note Patient Name: Justin Solomon DOB: 1950/09/13 MRN: [id number]   Date of Service  02/10/2021  HPI/Events of Note  K+ 6.4  eICU Interventions  Hyperkalemia protocol orders entered.        Thomasene Lot Missey Hasley 02/10/2021, 5:01 AM

## 2021-02-10 NOTE — Progress Notes (Signed)
EEG complete - results pending 

## 2021-02-10 NOTE — Progress Notes (Signed)
NAME:  Justin Solomon, MRN:  [id number], DOB:  Nov 17, 1950, LOS: 8 ADMISSION DATE:  02/01/2021, CONSULTATION DATE: 6/1 REFERRING MD: Dr. Toniann Fail TRH, CHIEF COMPLAINT: GI bleed  Brief History:  70 year old male with complicated recent past medical history significant for admission outside hospital back in March 2022 after a fall.  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   He is also unable to be liberated from the ventilator and has since undergone tracheostomy.  He was discharged to [facility] in [place] for ongoing rehab. On 5/31 he presented to [facility] ER from [facility] for evaluation of GI bleeding. RN at [facility] noted bright red blood with clots in his brief as he was being changed. In the ED there were clots noted in the rectal area, but no active bleeding was observed. Vitals remained stable. CT abdomen concerning for large volume of stool concerning for constipation and marked rectal distension. Also described loculated R pleural effusion. Hemoglobin 7.5.   The patient was admitted to the hospitalist service. GI was consulted and were planning for tagged RBC study, however, the patient decompensated in the early afternoon hours of 6/1. He developed respiratory depression and near respiratory arrest. PCCM was consulted.   Significant Hospital Events: Including procedures, antibiotic start and stop dates in addition to other pertinent events   5/31 admit for GIB 6/1 transfer to ICU/shock. EGD and Flex sig performed by GI. 6/2 no transfusion 6/4 hgb  6.5 transfused one unit with hgb up to 8.3 6/5 am  6/6 hemoglobin remained stable, started on Cardizem overnight and required low-dose Levophed.  Sputum with MRSA On Vanc cefepime and Flagyl day 5, abx changed to linezolid (for Corynebacterium coverage) and cefepime 6/7 Transitioned to amiodarone, remains on low dose Levophed  6/8 On amiodarone, off levophed around 1050 am.  MRI  pending. Tracking provider / eyes open  MRI brain was done which showed multiple small bilateral acute/subacute strokes, patient was transferred to [facility] for persistent encephalopathy   Antibiotics: Zosyn 5/31-6/1 Linezolid 6/6 Flagyl 6/1- 6/5 Vancomycin 6/1- 6/6 Cefepime 6/1 >> 6/8 Linezolid 6/6 >>  Micro Respiratory culture 6/3 >> MRSA and Corynebacterium striatum> Cipro, erythro, oxacillin resistant MRSA screen 6/1 >> positive COVID-19 and flu 6/1 >> negative  Tubes/lines Right femoral CVC 6/1 >> Art line 6/9>>  Interim History / Subjective:  Patient was transferred overnight from [facility] for persistent encephalopathy Overnight he became hypoxic, requiring BVM followed by placed on ventilator with improvement in oxygenation He became hypotensive requiring reinitiation of Levophed currently on 2 mics  Objective   Blood pressure (!) 98/59, pulse 64, temperature (!) 94.5 F (34.7 C), temperature source Axillary, resp. rate (!) 23, height 6' (1.829 m), weight 97.6 kg, SpO2 97 %.    Vent Mode: PRVC FiO2 (%):  [28 %-80 %] 80 % Set Rate:  [24 bmp] 24 bmp Vt Set:  [620 mL] 620 mL PEEP:  [6 cmH20] 6 cmH20 Plateau Pressure:  [21 cmH20] 21 cmH20   Intake/Output Summary (Last 24 hours) at 02/10/2021 0829 Last data filed at 02/10/2021 0800 Gross per 24 hour  Intake 2827.54 ml  Output 2145 ml  Net 682.54 ml   Filed Weights   02/07/21 0428 02/08/21 0337 02/09/21 0337  Weight: 95.6 kg 96.2 kg 97.6 kg    General: Acute on chronically ill appearing adult male lying in bed on ventilator via trach HEENT: MM pink/moist, #6 trach midline c/d/i Neuro: eyes open, does  not follow commands, absent threat reflex, weak cough and gag  CV: Regular rate and rhythm, no murmur appreciated PULM: non-labored on ATC, lungs bilaterally with rhonchi GI: soft, bsx4 active, PEG in place c/d/i Extremities: warm/dry, 1+ BLE pitting edema, dependent edema in BUE's  Skin: Has multiple  unstageable decubitus ulcer on shoulder, sacrum and buttocks  Labs/imaging that I have personally reviewed  (right click and "Reselect all SmartList Selections" daily)  CBC - platelets 205, white count 29.9, hemoglobin 9.1 BMP - CO2 19, BUn 50, Sr Cr 1.03 Micro  I/O's  PCXR 6/7 - cervical hardware noted, trach in good position, right base atx with small effusion   Resolved Hospital Problem list   Hemorrhagic shock  Assessment & Plan:  Acute GI bleed with hemorrhagic shock Patient presented with shock. S/p EGD and flex sig on 6/1 without stigmata of bleeding, EGD showed mild esophagitis, gastritis & few non-bleeding ulcers. Flex sig with poor prep, solid stool / no bleeding, possible rectal ulcer. GI signed off.  H&H remained stable Continue to monitor and transfuse for Hgb <7 Continue PPI Continue MiraLAX to help with constipation  Septic shock due to MRSA pneumonia Patient continued to require low-dose vasopressors Started on stress dose steroid Increase midodrine to 20 mg 3 times daily Continue IV antibiotics with Zyvox Continue Levophed with map goal 65  Acute on chronic hypoxic respiratory failure with possible MRSA pneumonia and sepsis Patient became hypoxic overnight, requiring full vent support and Currently on 40% FiO2 and 5 of PEEP, will try to place him back on trach collar Continue routine trach care, has #6 cuffed Shiley  Continue linezolid Completed cefepime therapy  AKI Hyperkalemia AKI is improving currently serum creatinine is at 1.0 Patient became hyperkalemic with serum potassium of 6.4 He received hyperkalemia treatment Repeat labs are pending Avoid nephrotoxic agents, ensure adequate renal perfusion continue free water    Paroxysmal atrial fibrillation HTN Patient converted to sinus rhythm, now heart rate in 60s Amiodarone was stopped Holding metoprolol due to hypotension Cannot be anticoagulated due to recent GI bleeding  Acute/subacute  bilateral multifocal strokes MRI brain confirmed small bilateral acute/subacute strokes likely embolic in origin Neurology following Will get repeat imaging Continue secondary stroke prophylaxis  Baseline Quadriplegia Patient had a fall 11/2020 cervical fracture and subsequent epidural hematoma s/p evacuation and subsequent quadriplegia. C-Collar removed 6/7 after review with patient's neurosurgeon.  -appreciate Palliative Care input  -family requests full code > last family meeting family reported patient was awake prior to admit, hopeful for recovery    Acute metabolic/toxic encephalopathy Per patient's family, this is new in the last 10 to 14 days.  CT head was negative for acute findings on 6/6.  Ammonia was normal. Suspect secondary to critical illness, shock and AKI  MRI showed multiple subacute stroke EEG is pending Neurology is following  Lung nodule -will need outpatient follow up    Best practice (right click and "Reselect all SmartList Selections" daily)  Diet:  Tube Feed  Pain/Anxiety/Delirium protocol (if indicated): No VAP protocol (if indicated): Yes DVT prophylaxis: SCD GI prophylaxis: PPI Glucose control:  SSI No Central venous access:  Yes, and it is still needed   Arterial line: No Foley:  Yes, and it is still needed Mobility:  bed rest  PT consulted: N/A Last date of multidisciplinary goals of care discussion: 6/1, son confirmed full code status.  code Status:  full code Disposition: ICU   Total critical care time: 49 minutes  Performed by: Garnet Sierras  Tajah Schreiner   Critical care time was exclusive of separately billable procedures and treating other patients.   Critical care was necessary to treat or prevent imminent or life-threatening deterioration.   Critical care was time spent personally by me on the following activities: development of treatment plan with patient and/or surrogate as well as nursing, discussions with consultants, evaluation of patient's  response to treatment, examination of patient, obtaining history from patient or surrogate, ordering and performing treatments and interventions, ordering and review of laboratory studies, ordering and review of radiographic studies, pulse oximetry and re-evaluation of patient's condition.   Cheri Fowler MD [place] [facility] See Amion for pager If no response to pager, please call [phone] until 7pm After 7pm, Please call E-link [phone]

## 2021-02-10 NOTE — Progress Notes (Addendum)
STROKE TEAM PROGRESS NOTE    Interval History   No acute events overnight, no family at bedside.  Patient mental status remains encephalopathic and out of proportion to the tiny infarcts noted on MRI which appear to be lacunar unlikely to explain his mental status Pertinent Lab Work and Imaging    02/09/21 MRI Brain WO Contrast 1. Acute/subacute lacunar infarcts within the left corona radiata, left occipital lobe, right basal ganglia region and and left side of the pons, likely embolic. 2. Advanced chronic microvascular ischemic changes of the white matter. 3. 4. Prominence of the supratentorial ventricles and sylvian fissures with relative preservation of the high convexity sulci, may be seen in the setting of normal pressure hydrocephalus in the appropriate clinical scenario. 5. Bilateral mastoid effusion.  02/10/21 Transcranial Doppler US  Results pending   02/10/21 VS US Carotid  Results pending   02/10/21 Echocardiogram Complete  Results pending   Physical Examination  Elderly African-American male s/p tracheostomy. Constitutional: Alert with eyes open resting in bed  Cardiovascular: Normal RR Respiratory: No increased WOB   Mental status: Eyes open but is unresponsive.  Can not answer LOC questions, does not follow commands.  Grimaces minimally to supraorbital pressure and sternal rub. Speech: Tracheostomy in place, not speaking  Cranial nerves: R CN 6th palsy, Left peripheral 7th Palsy w/decreased BTT on the left and left facial weakness, Unable to assess tongue due to limited command following Motor: Normal bulk and tone. He is quadriplegic, unable to move any extremity even to painful stimulus Sensory: UTA  Coordination: UTA  Gait: Deferred due to weakness   Assessment and Plan   [name] is a 70 y.o. 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 fib, who was  admitted with GI bleed on 5/31 to [facility] long hospital. While there he underwent MRI as a part of encephalopathy work up which showed multiple small strokes for which he was tx to [facility] for further work up.   #Lacunar Stroke with the Left Corona Radiata, Left Occipital lobe, Right Basal Ganglia and left Pons likely from small vessel disease Patient presented with the symptoms described above. At this time, stroke work up is pending including Echo + Vessel imaging. Stroke labs w/LDL 30.1, A1C pending. MRI Brain is notable for multiple lacunar stroke likely in the setting of SVD given HTN, can also consider atrial fibrillation given he has not been anticoagulated. His hemoglobin has stabilized and GI bleed was evaluated with EGD that showed mild LA grade a esophagitis, gastritis and a few non bleeding duodenal ulcers. From a stroke stand point it is ok to initiate anticoagulation however will defer to primary team given recent GI bleed. In the interim recommend Aspirin 81 mg, no need for statin therapy given LDL 30.1.  - Initiate Aspirin 81 mg for secondary stroke prevention  - Ok to start [facility] from a stroke standpoint, will defer to primary team given multiple conformabilities + recent GI bleed  - No need for permissive HTN  -At discharge please place ambulatory referral to neurology for stroke follow up   #Encephalopathy  Small strokes noted on MRI are not contributing to encephalopathy. Likely metabolic in origin given electrolyte abnormalities and elevated LFTs. EEG ordered as a part of encephalopathy work up was pertinent for GPDs commonly seen in toxic metabolic causes, no seizure or definite epileptiform discharges were seen.  - B12, Folate,  TSH ordered as a part of encephalopathy work up  - Enforce delirium precautions   Hospital day # 8  Stark Jock, NP  [facility] Nurse Practitioner Patient seen and discussed with attending physician Dr. Pearlean Brownie  I have personally obtained  history,examined this patient, reviewed notes, independently viewed imaging studies, participated in medical decision making and plan of care.ROS completed by me personally and pertinent positives fully documented  I have made any additions or clarifications directly to the above note. Agree with note above.  Patient is chronically ill s/p tracheostomy and quadriplegia and has A. fib but has not been on anticoagulation due to recent GI bleed.  His mental status seems out of proportion to the small multiple subcortical infarcts seen on MRI which may be from small vessel disease or A. fib since he is not on anticoagulation.  Continue ongoing stroke work-up.  His prognosis is poor due to his underlying multiple medical issues.  Initiate aspirin for now if it safe from a GI bleeding standpoint and anticoagulation later after medical stability.  No family at the bedside.  Discussed with Dr. Merrily Pew critical care medicine. This patient is critically ill and at significant risk of neurological worsening, death and care requires constant monitoring of vital signs, hemodynamics,respiratory and cardiac monitoring, extensive review of multiple databases, frequent neurological assessment, discussion with family, other specialists and medical decision making of high complexity.I have made any additions or clarifications directly to the above note.This critical care time does not reflect procedure time, or teaching time or supervisory time of PA/NP/Med Resident etc but could involve care discussion time.  I spent 30 minutes of neurocritical care time  in the care of  this patient.      Delia Heady, MD Medical Director [facility] Pager: [phone] 02/10/2021 3:59 PM    To contact Stroke Continuity provider, please refer to [email or web address]. After hours, contact General Neurology

## 2021-02-10 NOTE — Progress Notes (Addendum)
Justin Solomon transferred from [facility] ICU to [phone] for Neurology evaluation after 6/8 MRI concerning for acute embolic CVA and hydrocephalus. Patient has now arrived at [facility].  O:  BP 107/58 (74), pluse 64, Temp 97.58F, Axillary, RR 14, SpO2 100% on 100% trach collar.  General: Chronically ill appearing, in bed, no acute distress HEENT: MM pink/moist, anicteric, #6 trach midline Neuro: Eyes open, no following commands, pupils 39mm slight reaction CV: S1S2, irregularly irregular, no m/r/g appreciated PULM:  rhonci in the upper lobes and n the lower lobes, non labored, chest expansion symmetric GI: soft, bsx4 active, peg in place, c/d/i Extremities: warm/dry, 1+ BLLE edema, capillary refill less than 3 seconds   A: Justin Solomon has transferred to [facility] for Neurology evaluation after 6/8 MRI concerning for acute embolic CVA and hydrocephalus. No acute distress after transport. Stable condition.  P: -Dr. Derry Lory contacted by Warrick Parisian MD. Dr. Derry Lory to see and start stroke orders. -On 100% TC post transport. Goal SpO2 92-98%. Titrate O2 to goal. Per nursing was on 28% prior to transport. Spoke with nursing regarding weaning FiO2. -Continue plan as outlined by Veleta Miners NP and Everardo All MD.  -Day team to see.   Gershon Mussel., MSN, APRN, AGACNP-BC [facility]  02/10/2021 , 12:28 AM  Please see [email or web address] for pager details  If no response, please call [phone] After hours, please call Elink at [phone]'

## 2021-02-10 NOTE — Progress Notes (Signed)
eLink Physician-Brief Progress Note Patient Name: Justin Solomon DOB: 02-27-1951 MRN: [id number]   Date of Service  02/10/2021  HPI/Events of Note  Patient desaturated and became bradycardic, he had been on a trach collar for the past 24 hours. RT reports bilateral breath sounds with some crackles.  eICU Interventions  Patient bagged up to a saturation in the 90's then placed on the ventilator on a PEEP of 8, stat portable CXR ordered, ABG in one hour ordered.        Thomasene Lot Paris Hohn 02/10/2021, 6:19 AM

## 2021-02-10 NOTE — Consult Note (Signed)
NEUROLOGY CONSULTATION NOTE   Date of service: February 10, 2021 Patient Name: Justin Solomon MRN:  [id number] DOB:  [birth year] Reason for consult: "stroke" Requesting Provider: Luciano Cutter, MD _ _ _   _ __   _ __ _ _  __ __   _ __   __ _  History of Present Illness  Justin Solomon is a 70 y.o. 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 fib, who was admitted with GI bleed on 5/31 to [facility] long hospital.  Hospitalization also complicated by A. fib with RVR and hypotension.  He had work-up with MRI brain for persistent encephalopathy which demonstrated subcentimeter infarcts in left corona radiata, left occipital lobe, right basal ganglia and left side of the pons.  He was transferred to [facility] for further evaluation for neurological cause of altered mental status and for stroke work-up.  Patient is minimally responsive on my evaluation unable to provide any meaningful history at all.  ROS   Unable to obtain review of system, PMH, PSH, Fhx, SHx due to poor responsiveness  Past History   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)    Quadraplegic   Past Surgical History:  Procedure Laterality Date   ESOPHAGOGASTRODUODENOSCOPY N/A 02/02/2021   Procedure: ESOPHAGOGASTRODUODENOSCOPY (EGD);  Surgeon: Kathi Der, MD;  Location: [facility];  Service: Gastroenterology;  Laterality: N/A;   FLEXIBLE SIGMOIDOSCOPY N/A 02/02/2021   Procedure: FLEXIBLE SIGMOIDOSCOPY;  Surgeon: Kathi Der, MD;  Location: [facility];  Service: Gastroenterology;  Laterality: N/A;   PEG TUBE PLACEMENT     TRACHEOSTOMY     Family History  Problem Relation Age of Onset   Diabetes Mellitus II Neg Hx    Social History   Socioeconomic History   Marital  status: Single    Spouse name: Not on file   Number of children: Not on file   Years of education: Not on file   Highest education level: Not on file  Occupational History   Not on file  Tobacco Use   Smoking status: Former    Pack years: 0.00   Smokeless tobacco: Never  Substance and Sexual Activity   Alcohol use: Not Currently   Drug use: Not Currently   Sexual activity: Not Currently  Other Topics Concern   Not on file  Social History Narrative   Not on file   Social Determinants of Health   Financial Resource Strain: Not on file  Food Insecurity: Not on file  Transportation Needs: Not on file  Physical Activity: Not on file  Stress: Not on file  Social Connections: Not on file   No Known Allergies  Medications   Medications Prior to Admission  Medication Sig Dispense Refill Last Dose   acetaminophen (TYLENOL) 325 MG tablet Place 650 mg into feeding tube every 6 (six) hours as needed for mild pain, fever or headache.      Amantadine HCl 100 MG tablet Place 100 mg into feeding tube 2 (two) times daily.      amiodarone (PACERONE) 200 MG tablet Place 200 mg into feeding tube daily.      amLODipine (NORVASC) 10 MG tablet Place 10 mg into feeding tube daily.      atorvastatin (LIPITOR) 80 MG tablet  Place 80 mg into feeding tube daily.      baclofen (LIORESAL) 5 mg TABS tablet Place 5 mg into feeding tube every 12 (twelve) hours.      Carboxymethylcellulose Sod PF 0.5 % SOLN Place 1 drop into both eyes every 6 (six) hours.      chlorhexidine (PERIDEX) 0.12 % solution Use as directed 5 mLs in the mouth or throat every 12 (twelve) hours.      famotidine (PEPCID) 20 MG tablet Place 20 mg into feeding tube every 12 (twelve) hours.      fluconazole (DIFLUCAN) 100 MG tablet Place 100 mg into feeding tube daily.      heparin 5000 UNIT/ML injection Inject 5,000 Units into the skin every 8 (eight) hours.      metoprolol tartrate (LOPRESSOR) 25 MG tablet Place 25 mg into feeding tube  every 12 (twelve) hours.      Nutritional Supplements (FEEDING SUPPLEMENT, JEVITY 1.5 CAL,) LIQD Place 1,000 mLs into feeding tube continuous. 57ml/hour      traMADol (ULTRAM) 50 MG tablet Place 50 mg into feeding tube every 8 (eight) hours.      LACTATED RINGERS IV Inject 1 L into the vein See admin instructions. 1 L at 79ml/hour. Discontinue order when completed   02/01/2021     Vitals   Vitals:   02/09/21 2100 02/09/21 2246 02/09/21 2300 02/10/21 0000  BP: 135/71  123/63 (!) 107/58  Pulse: 67 70 97 66  Resp: 19 (!) 22 (!) 24 14  Temp:    97.6 F (36.4 C)  TempSrc:    Axillary  SpO2: 97% 93% (!) 87% 100%  Weight:      Height:         Body mass index is 29.18 kg/m.  Physical Exam   General: Laying comfortably in bed; in no acute distress. HENT: Normal oropharynx and mucosa. Normal external appearance of ears and nose. Neck: Supple, no pain or tenderness CV: No JVD, + significant peripheral edema Pulmonary: Symmetric Chest rise. Trach in place Abdomen: Soft to touch, non-tender. Ext: No cyanosis, + edema, no deformity Skin: No rash. Normal palpation of skin.  Musculoskeletal: Normal digits and nails by inspection. No clubbing.  Neurologic Examination  Mental status/Cognition:  Eyes partially open, no response to loud voice or clap, Grimaces to supraorbital pressure and to nare stimulation, head resting to the Right and he moves his head side to side with nares stimulation. Eyes were noted to be more open at the end of exam.   ?briefly regards face when asked to look at me.  Cranial nerves:   CN II L pupil 41mm and R is 73mm, both round and reactive to light. Does not blink to threat.    CN III,IV,VI No gaze preference or deviation, no nystagmus, difficult to assess for dolls eyes with trach in place.   CN V    CN VII no asymmetry, no nasolabial fold flattening   CN VIII    CN IX & X    CN XI    CN XII    Motor/Sensory:  Muscle bulk: poor, tone flaccid No movements  noted in any arm or leg to proximal pinch. No grimace noted to any poximal pinch.  Reflexes:  Right Left Comments  Pectoralis      Biceps (C5/6) 2 2   Brachioradialis (C5/6) 2 2    Triceps (C6/7) 2 2    Patellar (L3/4) 2+ 2+    Achilles (S1) 1 1  Hoffman      Plantar mute mute   Jaw jerk    Coordination/Complex Motor:  - Unable to assess  Labs   CBC:  Recent Labs  Lab 02/08/21 0320 02/09/21 0330  WBC 17.2* 16.8*  HGB 8.2* 8.0*  HCT 26.0* 25.9*  MCV 87.8 89.3  PLT 150 140*    Basic Metabolic Panel:  Lab Results  Component Value Date   NA 145 02/09/2021   K 4.8 02/09/2021   CO2 19 (L) 02/09/2021   GLUCOSE 135 (H) 02/09/2021   BUN 56 (H) 02/09/2021   CREATININE 1.22 02/09/2021   CALCIUM 8.5 (L) 02/09/2021   GFRNONAA >60 02/09/2021   Lipid Panel: No results found for: LDLCALC HgbA1c: No results found for: HGBA1C Urine Drug Screen: No results found for: LABOPIA, COCAINSCRNUR, LABBENZ, AMPHETMU, THCU, LABBARB  Alcohol Level No results found for: [facility]  MR Angio head without contrast and Carotid Duplex BL: pending  MRI Brain: 1. Acute/subacute lacunar infarcts within the left corona radiata, left occipital lobe, right basal ganglia region and and left side of the pons, likely embolic. 2. Advanced chronic microvascular ischemic changes of the white matter. 3. 4. Prominence of the supratentorial ventricles and sylvian fissures with relative preservation of the high convexity sulci, may be seen in the setting of normal pressure hydrocephalus in the appropriate clinical scenario. 5. Bilateral mastoid effusion.  rEEG:  pending  Impression   Justin Solomon is a 70 y.o. 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 fib, who was admitted with GI bleed on 5/31 to [facility] long hospital.  Hospitalization also complicated by A. fib with RVR and  hypotension.  He had work-up with MRI brain for persistent encephalopathy which demonstrated subcentimeter infarcts in left corona radiata, left occipital lobe, right basal ganglia and left side of the pons. His neurologic examination is notable for poor responsiveness, no focal deficit.  The strokes are likely 2/2 Afibb vs poorly controlled small vessel disease. The strokes are tiny and unlikely to explain his encephalopathy. Likely his encephalopathy is multifactorial including poor baseline, hospitalization, recently on Cefepime, deranged LFTs.  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   Secondary Diagnosis: Paroxysmal atrial fibrillation, Acute Kidney Failure, and Possible Sepsis  Recommendations  - Recommend MR Angio head without contrast and US Carotid duplex - routine EEG - TTE - LDL - HbA1c - Cardiac monitoring - Vit B12, folate, TSH - Delirium precautions ______________________________________________________________________   Thank you for the opportunity to take part in the care of this patient. If you have any further questions, please contact the neurology consultation attending.  Signed,  Erick Blinks [facility] Pager Number [phone] _ _ _   _ __   _ __ _ _  __ __   _ __   __ _

## 2021-02-10 NOTE — Plan of Care (Signed)
Had family meeting with patient's 2 sons Davionne Dowty, Kandace Parkins and patient's daughter to [employer].  Updated about patient's condition considering multiorgan dysfunction/failure including acute stroke with acute metabolic/toxic encephalopathy, acute GI bleeding, improved, septic shock due to MRSA pneumonia, acute on chronic respiratory failure, acute kidney injury, severe malnutrition and multiple decubitus ulcers.  Suggested we should keep patient DNR and continue current treatment, patient's sons and daughter will discuss amongst each other and will get back to [place].  As of now we will keep patient as full code and continue current management      Cheri Fowler MD [facility] See Amion for pager If no response to pager, please call [phone] until 7pm After 7pm, Please call E-link [phone]

## 2021-02-10 NOTE — Procedures (Addendum)
Patient Name: Justin Solomon  MRN: [id number]  Epilepsy Attending: Charlsie Quest  Referring Physician/Provider: Dr Cheri Fowler Date: 02/10/2021 Duration: 35.41 mins  Patient history: 70 year old male with altered mental status.  EEG evaluate for seizures.  Level of alertness:  lethargic   AEDs during EEG study: None  Technical aspects: This EEG study was done with scalp electrodes positioned according to the 10-20 International system of electrode placement. Electrical activity was acquired at a sampling rate of 500Hz  and reviewed with a high frequency filter of 70Hz  and a low frequency filter of 1Hz . EEG data were recorded continuously and digitally stored.   Description: EEG showed continuous generalized 3 to 5 Hz theta-delta slowing. Generalized periodic discharges with triphasic morphology were noted at 1 to 1.5 Hz. Hyperventilation and photic stimulation were not performed.     ABNORMALITY - Periodic discharges with triphasic morphology, generalized ( GPDs) - Continuous slow, generalized  IMPRESSION: This study is suggestive of moderate to severe diffuse encephalopathy, nonspecific etiology could be secondary to toxic-metabolic causes.  EEG also showed generalized periodic discharges with triphasic morphology at 1 to 1.5 Hz which is on the ictal-interictal continuum.  However, the morphology and frequency of the discharges is more commonly seen in toxic metabolic causes. No seizures or definite epileptiform discharges were seen throughout the recording.  Justin Solomon 

## 2021-02-10 NOTE — Procedures (Signed)
Arterial Catheter Insertion Procedure Note  Justin Solomon  [id number]  1950/12/20  Date:02/10/21  Time:9:15 AM    Provider Performing: Rosalita Levan    Procedure: Insertion of Arterial Line (95320) without US guidance  Indication(s) Blood pressure monitoring and/or need for frequent ABGs  Consent Unable to obtain consent due to emergent nature of procedure.  Anesthesia None   Time Out Verified patient identification, verified procedure, site/side was marked, verified correct patient position, special equipment/implants available, medications/allergies/relevant history reviewed, required imaging and test results available.   Sterile Technique Maximal sterile technique including full sterile barrier drape, hand hygiene, sterile gown, sterile gloves, mask, hair covering, sterile ultrasound probe cover (if used).   Procedure Description Area of catheter insertion was cleaned with chlorhexidine and draped in sterile fashion. Without real-time ultrasound guidance an arterial catheter was placed into the right radial artery.  Appropriate arterial tracings confirmed on monitor.     Complications/Tolerance None; patient tolerated the procedure well.   EBL Minimal   Specimen(s) None

## 2021-02-10 NOTE — Progress Notes (Signed)
Discussed continuing low urine output with Dr. Merrily Pew. Pt has received 1L LR and albumin. New orders received for lasix, see MAR.

## 2021-02-10 NOTE — Progress Notes (Signed)
  Echocardiogram 2D Echocardiogram has been performed.  Justin Solomon 02/10/2021, 3:07 PM

## 2021-02-10 NOTE — Progress Notes (Signed)
Critical lab potassium 6.4 called on the patient. E-link called and notified.

## 2021-02-10 NOTE — Progress Notes (Signed)
TCD has been completed.   Preliminary results in CV Proc.   Blanch Media 02/10/2021 2:04 PM

## 2021-02-10 NOTE — Progress Notes (Signed)
Updated Dr. Merrily Pew that urine output has been 50 ml for the last 4 hours, received new orders for bolus of 1 L of LR

## 2021-02-11 DIAGNOSIS — L8945 Pressure ulcer of contiguous site of back, buttock and hip, unstageable: Secondary | ICD-10-CM

## 2021-02-11 DIAGNOSIS — I6330 Cerebral infarction due to thrombosis of unspecified cerebral artery: Secondary | ICD-10-CM

## 2021-02-11 DIAGNOSIS — G9341 Metabolic encephalopathy: Secondary | ICD-10-CM

## 2021-02-11 LAB — BASIC METABOLIC PANEL
Anion gap: 8 (ref 5–15)
BUN: 73 mg/dL — ABNORMAL HIGH (ref 8–23)
CO2: 17 mmol/L — ABNORMAL LOW (ref 22–32)
Calcium: 8.9 mg/dL (ref 8.9–10.3)
Chloride: 118 mmol/L — ABNORMAL HIGH (ref 98–111)
Creatinine, Ser: 1.54 mg/dL — ABNORMAL HIGH (ref 0.61–1.24)
GFR, Estimated: 48 mL/min — ABNORMAL LOW (ref >60)
Glucose, Bld: 143 mg/dL — ABNORMAL HIGH (ref 70–99)
Potassium: 4.4 mmol/L (ref 3.5–5.1)
Sodium: 143 mmol/L (ref 135–145)

## 2021-02-11 LAB — CBC
HCT: 24.2 % — ABNORMAL LOW (ref 39.0–52.0)
Hemoglobin: 7.4 g/dL — ABNORMAL LOW (ref 13.0–17.0)
MCH: 27.7 pg (ref 26.0–34.0)
MCHC: 30.6 g/dL (ref 30.0–36.0)
MCV: 90.6 fL (ref 80.0–100.0)
Platelets: 166 10*3/uL (ref 150–400)
RBC: 2.67 MIL/uL — ABNORMAL LOW (ref 4.22–5.81)
RDW: 21.2 % — ABNORMAL HIGH (ref 11.5–15.5)
WBC: 21.8 10*3/uL — ABNORMAL HIGH (ref 4.0–10.5)
nRBC: 0.3 % — ABNORMAL HIGH (ref 0.0–0.2)

## 2021-02-11 LAB — HEMOGLOBIN A1C
Hgb A1c MFr Bld: 5.4 % (ref 4.8–5.6)
Mean Plasma Glucose: 108 mg/dL

## 2021-02-11 LAB — GLUCOSE, CAPILLARY
Glucose-Capillary: 114 mg/dL — ABNORMAL HIGH (ref 70–99)
Glucose-Capillary: 125 mg/dL — ABNORMAL HIGH (ref 70–99)
Glucose-Capillary: 129 mg/dL — ABNORMAL HIGH (ref 70–99)
Glucose-Capillary: 134 mg/dL — ABNORMAL HIGH (ref 70–99)
Glucose-Capillary: 137 mg/dL — ABNORMAL HIGH (ref 70–99)
Glucose-Capillary: 170 mg/dL — ABNORMAL HIGH (ref 70–99)

## 2021-02-11 LAB — CREATININE, SERUM
Creatinine, Ser: 1.5 mg/dL — ABNORMAL HIGH (ref 0.61–1.24)
GFR, Estimated: 49 mL/min — ABNORMAL LOW (ref >60)

## 2021-02-11 LAB — POTASSIUM
Potassium: 4.6 mmol/L (ref 3.5–5.1)
Potassium: 4.4 mmol/L (ref 3.5–5.1)
Potassium: 4.6 mmol/L (ref 3.5–5.1)

## 2021-02-11 MED ORDER — POLYETHYLENE GLYCOL 3350 17 G PO PACK: 17.0000 g | PACK | Freq: Every day | ORAL | Status: DC | PRN

## 2021-02-11 MED ORDER — HYDROCORTISONE NA SUCCINATE PF 100 MG IJ SOLR
50.0000 mg | Freq: Every day | INTRAMUSCULAR | Status: AC
  Administered 2021-02-12: 50 mg via INTRAVENOUS
  Filled 2021-02-11: qty 2

## 2021-02-11 MED ORDER — HYDROCORTISONE NA SUCCINATE PF 100 MG IJ SOLR
50.0000 mg | Freq: Two times a day (BID) | INTRAMUSCULAR | Status: AC
  Administered 2021-02-11: 50 mg via INTRAVENOUS
  Filled 2021-02-11: qty 2

## 2021-02-11 NOTE — Progress Notes (Signed)
After consulting with the Palliative MedicineTeam, this chaplain updated the Pt.RN-Jen and phoned the Pt. son-Johnhenry with information on HHS-HIPAA (45 CFR 164.524) and the release of medical records to surrogate decision makers.   The chaplain understands (45 CFR [phone]) describes a written request for the release of medical records from the surrogate decision makers.  This chaplain shared communication with [facility] Patient Experience  department is a step for the children as the Pt. surrogate decision makers.  This chaplain emailed the HHS-HIPAA 45 CFR 164.524 to the Pt. son-Jathan per his request.  This chaplain is available for F/U spiritual care as needed.

## 2021-02-11 NOTE — Progress Notes (Signed)
NAME:  Justin Solomon, MRN:  [id number], DOB:  [birth year], LOS: 9 ADMISSION DATE:  02/01/2021, CONSULTATION DATE: 6/1 REFERRING MD: Dr. Toniann Fail TRH, CHIEF COMPLAINT: GI bleed  Brief History:  70 year old male with complicated recent past medical history significant for admission outside hospital back in March 2022 after a fall.  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   He is also unable to be liberated from the ventilator and has since undergone tracheostomy.  He was discharged to [facility] in [place] for ongoing rehab. On 5/31 he presented to [facility] ER from [facility] for evaluation of GI bleeding. [facility] at [facility] noted bright red blood with clots in his brief as he was being changed. In the ED there were clots noted in the rectal area, but no active bleeding was observed. Vitals remained stable. CT abdomen concerning for large volume of stool concerning for constipation and marked rectal distension. Also described loculated R pleural effusion. Hemoglobin 7.5.   The patient was admitted to the hospitalist service. GI was consulted and were planning for tagged RBC study, however, the patient decompensated in the early afternoon hours of 6/1. He developed respiratory depression and near respiratory arrest. PCCM was consulted.   Significant Hospital Events: Including procedures, antibiotic start and stop dates in addition to other pertinent events   5/31 admit for GIB 6/1 transfer to ICU/shock. EGD and Flex sig performed by GI. 6/2 no transfusion 6/4 hgb  6.5 transfused one unit with hgb up to 8.3 6/5 am  6/6 hemoglobin remained stable, started on Cardizem overnight and required low-dose Levophed.  Sputum with MRSA On Vanc cefepime and Flagyl day 5, abx changed to linezolid (for Corynebacterium coverage) and cefepime 6/7 Transitioned to amiodarone, remains on low dose Levophed  6/8 On amiodarone, off levophed around 1050 am.  MRI  pending. Tracking provider / eyes open  MRI brain was done which showed multiple small bilateral acute/subacute strokes, patient was transferred to [facility] for persistent encephalopathy 6/9 on and off pressors   Antibiotics: Zosyn 5/31-6/1 Linezolid 6/6 Flagyl 6/1- 6/5 Vancomycin 6/1- 6/6 Cefepime 6/1 >> 6/8 Linezolid 6/6 >>  Micro Respiratory culture 6/3 >> MRSA and Corynebacterium striatum> Cipro, erythro, oxacillin resistant MRSA screen 6/1 >> positive COVID-19 and flu 6/1 >> negative  Tubes/lines Right femoral CVC 6/1 >> Art line 6/9>>  Interim History / Subjective:  No overnight issues, no purposeful movements. Off pressors. No bleeding overnight.  Has been on vent support - desaturated 36 hours ago with trach collar and respiratory arrest. Copious stool output. Tolerating tube feeds   Objective   Blood pressure 130/81, pulse 62, temperature (!) 97.1 F (36.2 C), temperature source Axillary, resp. rate (!) 25, height 6' (1.829 m), weight 102.1 kg, SpO2 100 %.    Vent Mode: PRVC FiO2 (%):  [40 %] 40 % Set Rate:  [24 bmp] 24 bmp Vt Set:  [620 mL] 620 mL PEEP:  [5 cmH20] 5 cmH20 Plateau Pressure:  [14 cmH20-20 cmH20] 14 cmH20   Intake/Output Summary (Last 24 hours) at 02/11/2021 0844 Last data filed at 02/11/2021 0700 Gross per 24 hour  Intake 3747.33 ml  Output 1480 ml  Net 2267.33 ml   Filed Weights   02/08/21 0337 02/09/21 0337 02/11/21 0500  Weight: 96.2 kg 97.6 kg 102.1 kg    General: chronically ill appearing HEENT: MM pink/moist, #6 trach midline c/d/I, trach to vent, Neuro: eyes open, blinks to threat, weak cough/gag, does not  follow commands CV:  RRR no mrg PULM: no increased work of breathing, no wheezes,  copious secrestions GI: soft, peg in place Extremities: warm/dry, 1+ BLE pitting edema, dependent edema in BUE's  Skin: Has multiple unstageable decubitus ulcer on shoulder, sacrum and buttocks  Labs/imaging that I have personally reviewed   (right click and "Reselect all SmartList Selections" daily)   Cr 1.5, K 4.6, other morning labs pending.  Tsh 2.8 Resolved Hospital Problem list   Hemorrhagic shock Septic Shock  Assessment & Plan:   Acute/subacute bilateral multifocal strokes Acute metabolic encephalopathy MRI brain confirmed small bilateral acute/subacute strokes likely embolic in origin Continue secondary stroke prophylaxis Suspect encephalopathy secondary to critical illness, shock and AKI with stroke EEG shows diffuse slowing and metabolic encephalopathy Neurology is following Off all sedation since he's been at [place].   MRSA pneumonia Currently off pressors Will taper stress dose steroids Hold midodrine as normotensive Continue IV antibiotics with Zyvox - need to establish end date of abx Completed cefepime therapy  Acute on chronic hypoxic respiratory failure with possible MRSA pneumonia and sepsis Patient became hypoxic overnight, requiring full vent support and Currently on 40% FiO2 and 5 of PEEP, TC trials as tolerated Continue routine trach care, has #6 cuffed Shiley  Continue linezolid  AKI Hyperkalemia Improving with free H20 flushes, continue for now. Avoid nephrotoxic agents, ensure adequate renal perfusion continue free water   Acute GI bleed Patient presented with shock. S/p EGD and flex sig on 6/1 without stigmata of bleeding, EGD showed mild esophagitis, gastritis & few non-bleeding ulcers. Flex sig with poor prep, solid stool / no bleeding, possible rectal ulcer. GI signed off.  H&H remained stable Continue to monitor and transfuse for Hgb <7 Continue PPI Hold MiraLAX given diarrhea  Paroxysmal atrial fibrillation HTN Patient converted to sinus rhythm, now heart rate in 60s Amiodarone was stopped Holding metoprolol due to hypotension Cannot be anticoagulated due to recent GI bleeding  Baseline Quadriplegia Patient had a fall 11/2020 cervical fracture and subsequent  epidural hematoma s/p evacuation and subsequent quadriplegia. C-Collar removed 6/7 after review with patient's neurosurgeon.  -appreciate Palliative Care input  -family requests full code > last family meeting family reported patient was awake prior to admit, hopeful for recovery   Lung nodule -will need outpatient follow up    Best practice (right click and "Reselect all SmartList Selections" daily)  Diet:  Tube Feed  Pain/Anxiety/Delirium protocol (if indicated): No VAP protocol (if indicated): Yes DVT prophylaxis: SCD GI prophylaxis: PPI Glucose control:  SSI No Central venous access:  N/A   Arterial line: Yes - placed 6/09 Foley:  Yes, and it is still needed, chronic foley Mobility:  bed rest  PT consulted: N/A Last date of multidisciplinary goals of care discussion: 6/9, full scope of care code Status:  full code Disposition: ICU  The patient is critically ill due to respiratory failure, shock, encephalopathy.  Critical care was necessary to treat or prevent imminent or life-threatening deterioration.  Critical care was time spent personally by me on the following activities: development of treatment plan with patient and/or surrogate as well as nursing, discussions with consultants, evaluation of patient's response to treatment, examination of patient, obtaining history from patient or surrogate, ordering and performing treatments and interventions, ordering and review of laboratory studies, ordering and review of radiographic studies, pulse oximetry, re-evaluation of patient's condition and participation in multidisciplinary rounds.   Critical Care Time devoted to patient care services described in this note is  36 minutes. This time reflects time of care of this signee Charlott Holler . This critical care time does not reflect separately billable procedures or procedure time, teaching time or supervisory time of PA/NP/Med student/Med Resident etc but could involve care discussion  time.       Charlott Holler [facility] 02/11/2021 8:44 AM  Pager: see AMION  If no response to pager , please call critical care on call (see AMION) until 7pm After 7:00 pm call Elink

## 2021-02-11 NOTE — Progress Notes (Signed)
STROKE TEAM PROGRESS NOTE    Interval History  Patient remains on ventilator support as he had desaturated with trach collar.  He is now off vasopressor support but remains unresponsive with without any purposeful movements. No acute events overnight, no family at bedside.  Patient mental status remains encephalopathic and out of proportion to the tiny infarcts noted on MRI which appear to be lacunar unlikely to explain his mental status Pertinent Lab Work and Imaging    02/09/21 MRI Brain WO Contrast 1. Acute/subacute lacunar infarcts within the left corona radiata, left occipital lobe, right basal ganglia region and and left side of the pons, likely embolic. 2. Advanced chronic microvascular ischemic changes of the white matter. 3. 4. Prominence of the supratentorial ventricles and sylvian fissures with relative preservation of the high convexity sulci, may be seen in the setting of normal pressure hydrocephalus in the appropriate clinical scenario. 5. Bilateral mastoid effusion.  02/10/21 Transcranial Doppler US  Results pending   02/10/21 VS US Carotid  Results pending   02/10/21 Echocardiogram Complete  Results pending   Physical Examination  Elderly African-American male s/p tracheostomy. Constitutional: Alert with eyes open resting in bed  Cardiovascular: Normal RR Respiratory: No increased WOB   Mental status: Eyes open but is unresponsive.  Can not answer LOC questions, does not follow commands.  Grimaces minimally to supraorbital pressure and sternal rub. Speech: Tracheostomy in place, not speaking  Cranial nerves: R CN 6th palsy, Left peripheral 7th Palsy w/decreased BTT on the left and left facial weakness, Unable to assess tongue due to limited command following Motor: Normal bulk and tone. He is quadriplegic, unable to move any extremity even to painful stimulus Sensory: UTA  Coordination: UTA  Gait: Deferred due to weakness   Assessment and Plan   [name] is a 70 y.o. 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 fib, who was admitted with GI bleed on 5/31 to [facility] long hospital. While there he underwent MRI as a part of encephalopathy work up which showed multiple small strokes for which he was tx to [facility] for further work up.   #Lacunar Stroke with the Left Corona Radiata, Left Occipital lobe, Right Basal Ganglia and left Pons likely from small vessel disease Patient presented with the symptoms described above. At this time, stroke work up is pending including Echo + Vessel imaging. Stroke labs w/LDL 30.1, A1C pending. MRI Brain is notable for multiple lacunar stroke likely in the setting of SVD given HTN, can also consider atrial fibrillation given he has not been anticoagulated. His hemoglobin has stabilized and GI bleed was evaluated with EGD that showed mild LA grade a esophagitis, gastritis and a few non bleeding duodenal ulcers. From a stroke stand point it is ok to initiate anticoagulation however will defer to primary team given recent GI bleed. In the interim recommend Aspirin 81 mg, no need for statin therapy given LDL 30.1.  - Initiate Aspirin 81 mg for secondary stroke prevention  - Ok to start [facility] from a stroke standpoint, will defer to primary team given multiple conformabilities + recent GI bleed  - No need for permissive HTN  -At discharge please place ambulatory referral to neurology for stroke follow up   #Encephalopathy  Small strokes noted on MRI are not contributing to encephalopathy. Likely metabolic in origin given electrolyte abnormalities and elevated LFTs. EEG ordered as a part  of encephalopathy work up was pertinent for GPDs commonly seen in toxic metabolic causes, no seizure or definite epileptiform discharges were seen.  - B12, Folate, TSH ordered as a part of encephalopathy work up  - Enforce delirium precautions    Hospital day # 9   Patient is chronically ill s/p tracheostomy and quadriplegia and has A. fib but has not been on anticoagulation due to recent GI bleed.  His mental status seems out of proportion to the small multiple subcortical infarcts seen on MRI which may be from small vessel disease or A. fib since he is not on anticoagulation.     His prognosis is poor due to his underlying multiple medical issues.  Initiate aspirin for now if it safe from a GI bleeding standpoint and anticoagulation later after medical stability.  No family at the bedside.  Discussed with Dr. Celine Mans critical care medicine.  Stroke team will sign off.  Kindly call for questions This patient is critically ill and at significant risk of neurological worsening, death and care requires constant monitoring of vital signs, hemodynamics,respiratory and cardiac monitoring, extensive review of multiple databases, frequent neurological assessment, discussion with family, other specialists and medical decision making of high complexity.I have made any additions or clarifications directly to the above note.This critical care time does not reflect procedure time, or teaching time or supervisory time of PA/NP/Med Resident etc but could involve care discussion time.  I spent 30 minutes of neurocritical care time  in the care of  this patient.      Delia Heady, MD Medical Director [facility] St[facility] Pager: [phone] 02/11/2021 12:25 PM    To contact Stroke Continuity provider, please refer to [email or web address]. After hours, contact General Neurology

## 2021-02-11 NOTE — Progress Notes (Signed)
This chaplain responded to the MD consult for assisting the Pt. family in creating an Advance Directive.  The chaplain communicated with the Pt. RN-Jen C. The chaplain understands the Pt. is not able to make healthcare decisions for him self at this time. The Pt. children are the Pt. surrogate decision makers as stated by the [place] Hier[employer] of healthcare decision makers. The chaplain learned from the RN, [facility] will not release the Pt. records to the children without HCPOA.  The chaplain will explore [facility]'s decision and update the RN and family with her understanding of the  best next step.

## 2021-02-12 ENCOUNTER — Inpatient Hospital Stay (HOSPITAL_COMMUNITY): Payer: Medicare Other

## 2021-02-12 DIAGNOSIS — E875 Hyperkalemia: Secondary | ICD-10-CM

## 2021-02-12 DIAGNOSIS — I4891 Unspecified atrial fibrillation: Secondary | ICD-10-CM

## 2021-02-12 DIAGNOSIS — L89150 Pressure ulcer of sacral region, unstageable: Secondary | ICD-10-CM

## 2021-02-12 DIAGNOSIS — E43 Unspecified severe protein-calorie malnutrition: Secondary | ICD-10-CM

## 2021-02-12 LAB — BASIC METABOLIC PANEL
Anion gap: 9 (ref 5–15)
BUN: 77 mg/dL — ABNORMAL HIGH (ref 8–23)
CO2: 18 mmol/L — ABNORMAL LOW (ref 22–32)
Calcium: 9.2 mg/dL (ref 8.9–10.3)
Chloride: 118 mmol/L — ABNORMAL HIGH (ref 98–111)
Creatinine, Ser: 1.67 mg/dL — ABNORMAL HIGH (ref 0.61–1.24)
GFR, Estimated: 43 mL/min — ABNORMAL LOW (ref >60)
Glucose, Bld: 127 mg/dL — ABNORMAL HIGH (ref 70–99)
Potassium: 4.3 mmol/L (ref 3.5–5.1)
Sodium: 145 mmol/L (ref 135–145)

## 2021-02-12 LAB — CBC
HCT: 24.2 % — ABNORMAL LOW (ref 39.0–52.0)
Hemoglobin: 7.4 g/dL — ABNORMAL LOW (ref 13.0–17.0)
MCH: 28 pg (ref 26.0–34.0)
MCHC: 30.6 g/dL (ref 30.0–36.0)
MCV: 91.7 fL (ref 80.0–100.0)
Platelets: 155 10*3/uL (ref 150–400)
RBC: 2.64 MIL/uL — ABNORMAL LOW (ref 4.22–5.81)
RDW: 21.5 % — ABNORMAL HIGH (ref 11.5–15.5)
WBC: 20.6 10*3/uL — ABNORMAL HIGH (ref 4.0–10.5)
nRBC: 0.1 % (ref 0.0–0.2)

## 2021-02-12 LAB — GLUCOSE, CAPILLARY
Glucose-Capillary: 112 mg/dL — ABNORMAL HIGH (ref 70–99)
Glucose-Capillary: 113 mg/dL — ABNORMAL HIGH (ref 70–99)
Glucose-Capillary: 106 mg/dL — ABNORMAL HIGH (ref 70–99)
Glucose-Capillary: 113 mg/dL — ABNORMAL HIGH (ref 70–99)
Glucose-Capillary: 145 mg/dL — ABNORMAL HIGH (ref 70–99)
Glucose-Capillary: 102 mg/dL — ABNORMAL HIGH (ref 70–99)

## 2021-02-12 LAB — POTASSIUM: Potassium: 4.2 mmol/L (ref 3.5–5.1)

## 2021-02-12 MED ORDER — FUROSEMIDE 10 MG/ML IJ SOLN
40.0000 mg | Freq: Once | INTRAMUSCULAR | Status: AC
  Administered 2021-02-12: 40 mg via INTRAVENOUS
  Filled 2021-02-12: qty 4

## 2021-02-12 NOTE — Progress Notes (Addendum)
NAME:  Justin Solomon, MRN:  [id number], DOB:  05/15/1951, LOS: 10 ADMISSION DATE:  02/01/2021, CONSULTATION DATE: 6/1 REFERRING MD: Dr. Toniann Fail TRH, CHIEF COMPLAINT: GI bleed  Brief History:  70 year old male with complicated recent past medical history significant for admission outside hospital back in March 2022 after a fall.  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   He is also unable to be liberated from the ventilator and has since undergone tracheostomy.  He was discharged to [facility] in [place] for ongoing rehab. On 5/31 he presented to [facility] ER from [facility] for evaluation of GI bleeding. [facility] at [facility] noted bright red blood with clots in his brief as he was being changed. In the ED there were clots noted in the rectal area, but no active bleeding was observed. Vitals remained stable. CT abdomen concerning for large volume of stool concerning for constipation and marked rectal distension. Also described loculated R pleural effusion. Hemoglobin 7.5.   The patient was admitted to the hospitalist service. GI was consulted and were planning for tagged RBC study, however, the patient decompensated in the early afternoon hours of 6/1. He developed respiratory depression and near respiratory arrest. PCCM was consulted.   Significant Hospital Events: Including procedures, antibiotic start and stop dates in addition to other pertinent events   5/31 admit for GIB 6/1 transfer to ICU/shock. EGD and Flex sig performed by GI. 6/2 no transfusion 6/4 hgb  6.5 transfused one unit with hgb up to 8.3 6/5 am  6/6 hemoglobin remained stable, started on Cardizem overnight and required low-dose Levophed.  Sputum with MRSA On Vanc cefepime and Flagyl day 5, abx changed to linezolid (for Corynebacterium coverage) and cefepime 6/7 Transitioned to amiodarone, remains on low dose Levophed  6/8 On amiodarone, off levophed around 1050 am.  MRI  pending. Tracking provider / eyes open  MRI brain was done which showed multiple small bilateral acute/subacute strokes, patient was transferred to [facility] for persistent encephalopathy 6/9 on and off pressors   Antibiotics: Zosyn 5/31-6/1 Linezolid 6/6 Flagyl 6/1- 6/5 Vancomycin 6/1- 6/6 Cefepime 6/1 >> 6/8 Linezolid 6/6 >>  Micro Respiratory culture 6/3 >> MRSA and Corynebacterium striatum> Cipro, erythro, oxacillin resistant MRSA screen 6/1 >> positive COVID-19 and flu 6/1 >> negative  Tubes/lines Right femoral CVC 6/1 >> Art line 6/9>>  Interim History / Subjective:  No overnight issues. Still encephalopathic. On PS trial  Objective   Blood pressure (!) 156/52, pulse 73, temperature 97.7 F (36.5 C), temperature source Axillary, resp. rate 19, height 6' (1.829 m), weight 101.6 kg, SpO2 100 %.    Vent Mode: PSV;CPAP FiO2 (%):  [40 %] 40 % Set Rate:  [15 bmp] 15 bmp Vt Set:  [620 mL] 620 mL PEEP:  [5 cmH20] 5 cmH20 Pressure Support:  [5 cmH20] 5 cmH20   Intake/Output Summary (Last 24 hours) at 02/12/2021 1028 Last data filed at 02/12/2021 0700 Gross per 24 hour  Intake 1832.22 ml  Output 1850 ml  Net -17.78 ml   Filed Weights   02/09/21 0337 02/11/21 0500 02/12/21 0256  Weight: 97.6 kg 102.1 kg 101.6 kg    General: chronically ill appearing HEENT: trach to vent Neuro: eyes open, does not follow commands or track consistently CV:  RRR no mrg PULM: no increased work of breathing, no wheezes,  copious secrestions GI: soft, peg in place Extremities: warm/dry, 1+ BLE pitting edema, dependent edema in BUE's  Skin: Has multiple  unstageable decubitus ulcer on shoulder, sacrum and buttocks  Labs/imaging that I have personally reviewed  (right click and "Reselect all SmartList Selections" daily)   Cr 1.5, K 4.6, other morning labs pending.  Tsh 2.8 Resolved Hospital Problem list   Hemorrhagic shock Septic Shock  Assessment & Plan:   Acute/subacute  bilateral multifocal strokes Acute metabolic encephalopathy MRI brain confirmed small bilateral acute/subacute strokes likely embolic in origin Continue secondary stroke prophylaxis Suspect encephalopathy secondary to critical illness, shock and AKI with stroke EEG shows diffuse slowing and metabolic encephalopathy. Neurology signed off.  Off all sedation since he's been at [place]. Not more responsive yet.   Acute on chronic hypoxic respiratory failure MRSA pneumonia Patient became hypoxic overnight, requiring full vent support and Currently on 40% FiO2 and 5 of PEEP, TC trials as tolerated - will try today.  Continue routine trach care, has #6 cuffed Shiley  completed linezolid for mrsa pna as well as cefepime empiric Currently off pressors Will taper stress dose steroids  AKI Hyperkalemia Improving with free H20 flushes Will move to check K daily Avoid nephrotoxic agents, ensure adequate renal perfusion continue free water   Acute GI bleed Patient presented with shock. S/p EGD and flex sig on 6/1 without stigmata of bleeding, EGD showed mild esophagitis, gastritis & few non-bleeding ulcers. Flex sig with poor prep, solid stool / no bleeding, possible rectal ulcer. GI signed off.  H&H remained stable Continue to monitor and transfuse for Hgb <7 Continue PPI Hold MiraLAX given diarrhea  Paroxysmal atrial fibrillation HTN Patient converted to sinus rhythm, now heart rate in 60s Amiodarone was stopped Holding metoprolol due to hypotension Cannot be anticoagulated due to recent GI bleeding  Baseline Quadriplegia Patient had a fall 11/2020 cervical fracture and subsequent epidural hematoma s/p evacuation and subsequent quadriplegia. C-Collar removed 6/7 after review with patient's neurosurgeon.  -appreciate Palliative Care input  -family requests full code > last family meeting family reported patient was awake prior to admit, hopeful for recovery   Plan for family meeting  6/12 at noon. Will make palliative care aware.  Lung nodule -will need outpatient follow up    Best practice (right click and "Reselect all SmartList Selections" daily)  Diet:  Tube Feed  Pain/Anxiety/Delirium protocol (if indicated): No VAP protocol (if indicated): Yes DVT prophylaxis: SCD GI prophylaxis: PPI Glucose control:  SSI No Central venous access:  N/A   Arterial line: will d/c today Foley:  Yes, and it is still needed, chronic foley Mobility:  bed rest  PT consulted: N/A Last date of multidisciplinary goals of care discussion: 6/9, full scope of care. Consulting palliative care to assist with goals of care and complex medical decision making.  code Status:  full code Disposition: ICU  The patient is critically ill due to respiratory failure, encephalopathy.  Critical care was necessary to treat or prevent imminent or life-threatening deterioration.  Critical care was time spent personally by me on the following activities: development of treatment plan with patient and/or surrogate as well as nursing, discussions with consultants, evaluation of patient's response to treatment, examination of patient, obtaining history from patient or surrogate, ordering and performing treatments and interventions, ordering and review of laboratory studies, ordering and review of radiographic studies, pulse oximetry, re-evaluation of patient's condition and participation in multidisciplinary rounds.   Critical Care Time devoted to patient care services described in this note is 33 minutes. This time reflects time of care of this signee Charlott Holler . This critical  care time does not reflect separately billable procedures or procedure time, teaching time or supervisory time of PA/NP/Med student/Med Resident etc but could involve care discussion time.       Charlott Holler [facility] 02/12/2021 10:28 AM  Pager: see AMION  If no response to pager , please call  critical care on call (see AMION) until 7pm After 7:00 pm call Elink

## 2021-02-12 NOTE — Progress Notes (Signed)
Patient is on ATC tolerating it well. FiO2 28% on ATC no complications noted. Patient HR is stable, respirations are under 20. Patient is hemodynamically stable. Vent Is on stand-by at bedside if needed for respiratory compromise or clinical deterioration. RN aware.

## 2021-02-12 NOTE — Progress Notes (Signed)
VASCULAR LAB    Carotid duplex has been performed.  See CV proc for preliminary results.   Amylynn Fano, RVT 02/12/2021, 11:27 AM

## 2021-02-13 DIAGNOSIS — I6349 Cerebral infarction due to embolism of other cerebral artery: Secondary | ICD-10-CM

## 2021-02-13 LAB — BASIC METABOLIC PANEL
Anion gap: 7 (ref 5–15)
BUN: 85 mg/dL — ABNORMAL HIGH (ref 8–23)
CO2: 20 mmol/L — ABNORMAL LOW (ref 22–32)
Calcium: 9.1 mg/dL (ref 8.9–10.3)
Chloride: 120 mmol/L — ABNORMAL HIGH (ref 98–111)
Creatinine, Ser: 1.68 mg/dL — ABNORMAL HIGH (ref 0.61–1.24)
GFR, Estimated: 43 mL/min — ABNORMAL LOW (ref >60)
Glucose, Bld: 101 mg/dL — ABNORMAL HIGH (ref 70–99)
Potassium: 4.8 mmol/L (ref 3.5–5.1)
Sodium: 147 mmol/L — ABNORMAL HIGH (ref 135–145)

## 2021-02-13 LAB — CBC
HCT: 27.4 % — ABNORMAL LOW (ref 39.0–52.0)
Hemoglobin: 8.4 g/dL — ABNORMAL LOW (ref 13.0–17.0)
MCH: 27.8 pg (ref 26.0–34.0)
MCHC: 30.7 g/dL (ref 30.0–36.0)
MCV: 90.7 fL (ref 80.0–100.0)
Platelets: 162 10*3/uL (ref 150–400)
RBC: 3.02 MIL/uL — ABNORMAL LOW (ref 4.22–5.81)
RDW: 22 % — ABNORMAL HIGH (ref 11.5–15.5)
WBC: 16.4 10*3/uL — ABNORMAL HIGH (ref 4.0–10.5)
nRBC: 0.1 % (ref 0.0–0.2)

## 2021-02-13 LAB — GLUCOSE, CAPILLARY
Glucose-Capillary: 92 mg/dL (ref 70–99)
Glucose-Capillary: 88 mg/dL (ref 70–99)
Glucose-Capillary: 111 mg/dL — ABNORMAL HIGH (ref 70–99)
Glucose-Capillary: 133 mg/dL — ABNORMAL HIGH (ref 70–99)
Glucose-Capillary: 143 mg/dL — ABNORMAL HIGH (ref 70–99)
Glucose-Capillary: 91 mg/dL (ref 70–99)
Glucose-Capillary: 95 mg/dL (ref 70–99)

## 2021-02-13 MED ORDER — CHLORHEXIDINE GLUCONATE 0.12 % MT SOLN
15.0000 mL | Freq: Two times a day (BID) | OROMUCOSAL | Status: DC
  Administered 2021-02-13 – 2021-02-15 (×4): 15 mL via OROMUCOSAL
  Filled 2021-02-13 (×2): qty 15

## 2021-02-13 MED ORDER — ORAL CARE MOUTH RINSE
15.0000 mL | Freq: Two times a day (BID) | OROMUCOSAL | Status: DC
  Administered 2021-02-13 – 2021-02-15 (×4): 15 mL via OROMUCOSAL

## 2021-02-13 MED ORDER — FREE WATER
200.0000 mL | Status: DC
  Administered 2021-02-13 – 2021-02-14 (×5): 200 mL

## 2021-02-13 NOTE — Progress Notes (Signed)
Family Meeting Documentation   I had a family meeting today with the patient's 3 children, and 2 sisters, as well as daughter-in-law.  Gaynelle Arabian with palliative care and PA student also present.  Mr. Creelman has had quadriplegia since March of this year following a CVA.  He has had subsequent subdural hematomas.  At [facility] he was reportedly talking and moving upper extremities and spending time off the ventilator during the day.  Family says that he would be difficult to understand but he got close and if you could hear him.  Gust that Mr. Linde Gillis has recurrent embolic stroke and is unable to be anticoagulated for stroke prevention due to his GI bleed.  This puts him at risk for future strokes and would certainly hinder any potential recovery he would have from his prior CVA.  He has had multiple hospital-acquired infections including MRSA pneumonia.  Although he is doing trach collar now, I feel he is very high risk for further stroke, decompensation, further hospital-acquired infection.  He also has large sacral decubitus ulcers and skin breakdown which are very concerning and could subsequently become effective.  We discussed his desired quality of life and if this would feel acceptable to him.  They are the [employer] faith and feel that this is currently an acceptable quality of life, but they do not want him to suffer.  They would like to continue all ongoing aggressive measures for future care.  We did discuss CODE STATUS and will continue full code for now.  Durel Salts, MD Pulmonary and Critical Care Medicine [facility]

## 2021-02-13 NOTE — Progress Notes (Signed)
Daily Progress Note   Patient Name: Justin Solomon       Date: 02/13/2021 DOB: 02/01/1951  Age: 70 y.o. MRN#: [id number] Attending Physician: Charlott Holler, MD Primary Care Physician: Patrecia Pour, MD Admit Date: 02/01/2021  Reason for Consultation/Follow-up: To discuss complex medical decision making related to patient's goals of care  Dr. Celine Mans asked Justin Solomon (PA-S2) and myself to join her in meeting with the Ging[place] family.   Chart reviewed.  Subjective: We meet with patient's 3 children Justin Solomon, Justin Solomon and Justin Solomon and his 3 sisters Justin Solomon and Justin Solomon is a conference room after visiting the patient's bedside.  The family shared that prior to March the patient had slowed down some due to age and arthritis but was walking, talking and independent.  Consequently he has had a drastic change and steep decline since his fall in March that left him with quadriplegia.  They also shared that just before this admission he was starting to improve at [facility].  He was moving his arms more and able to speak softly.  Dr. Celine Mans explained that we appreciate their coming to the meeting (from [facility]) today and that she felt it was very important to share information with the family.  She reviewed the facts of Justin Solomon admission starting with the GI bleed and then addressing his multiple bilateral strokes.  She expressed her deep concern that he may not improve from his current state, and asked about what quality of life he would want to have.    The family at first said that the patient had not really talked about what type of care he would want.  Then after some thought and further discussion - his sister Justin Dandy commented that the patient always said "If this is what God's got for me, I'll  go thru it".    Justin Solomon (son) commented that he heard his father say that when he was trying to recover from a hip fracture.  Justin Solomon further expressed that it is important to their [employer] faith to try as much as possible.    The middle of the meeting became emotional when Justin Solomon cried "Please don't let my Daddy lay there and die".  We provided reassurance that we are doing everything we can for him.   Justin Solomon quickly left the room.  Trust expressed that they do not want their father to suffer.  When Dr. Celine Mans asked if they felt their father was suffering the family responded that he did not seem to be suffering.    As we continued to talk the family expressed appreciation for the care Justin Solomon is receiving and we provided assurances that we would continue to care for him in the best way possible.  We also committed to keep him updated and to have further conversations if we feel he is suffering.  Palliative contact information provided to the family.  Assessment:  Justin Solomon.  Now suffered thru GIB, hemorrhagic shock, and bilateral strokes secondary to afib.  He is unable to take anticoagulation.  Patient Profile/HPI:  Briefly, Justin Solomon is a 70 year old male with complicated recent history significant for fall in March that resulted in hospitalization at outside facility.  He required spinal surgery which was further complicated by epidural hematoma that ultimately resulted in quadriplegia.  He became ventilator dependent and underwent tracheostomy and PEG tube placement.  He was discharged to [facility] here in [place] for continued rehab.  He presented to [facility] from [facility] for evaluation for GI bleeding.  He was noted to have clots on arrival but no active bleeding was observed.  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  He underwent EGD and flex sig on 6/1.  No active bleeding  was noted but also indicated that there was poor prep.  Rectal bleeding seems to be improved and GI has signed off.  His mental status remains poor and he is minimally interactive.  Palliative consulted for goals of care.     Length of Stay: 11   Vital Signs: BP 128/65   Pulse 86   Temp (!) 97.5 F (36.4 C) (Axillary) Comment: RN notified  Resp (!) 21   Ht 6' (1.829 m)   Wt 101.6 kg   SpO2 100%   BMI 30.38 kg/m  SpO2: SpO2: 100 % O2 Device: O2 Device: Tracheostomy Collar O2 Flow Rate: O2 Flow Rate (L/min): 5 L/min       Palliative Assessment/Data: 10%     Palliative Care Plan    Recommendations/Plan: Family requests continued full scope interventions.  Full Code. Family aware of the seriousness of Justin Solomon condition. Family does not want him to suffer.  Code Status:  Full code  Prognosis:  Weeks to months with continued full scope support. He is at high risk for acute decline and death.  Discharge Planning: Return to [facility]  Care plan was discussed with Family, ICU RN, and Dr. Celine Mans  Thank you for allowing the Palliative Medicine Team to assist in the care of this patient.  Total time spent:  45 min.     Greater than 50%  of this time was spent counseling and coordinating care related to the above assessment and plan.  Justin Richards, PA-C Palliative Medicine  Please contact Palliative MedicineTeam phone at [phone] for questions and concerns between 7 am - 7 pm.   Please see AMION for individual provider pager numbers.

## 2021-02-13 NOTE — Progress Notes (Signed)
NAME:  Justin Solomon, MRN:  [id number], DOB:  1951/07/13, LOS: 11 ADMISSION DATE:  02/01/2021, CONSULTATION DATE: 6/1 REFERRING MD: Dr. Toniann Fail TR[facility], CHIEF COMPLAINT: GI bleed  Brief History:  70 year old male with complicated recent past medical history significant for admission outside hospital back in March 2022 after a fall.  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   He is also unable to be liberated from the ventilator and has since undergone tracheostomy.  He was discharged to [facility] in [place] for ongoing rehab. On 5/31 he presented to [facility] ER from [facility] for evaluation of GI bleeding. [facility] at [facility] noted bright red blood with clots in his brief as he was being changed. In the ED there were clots noted in the rectal area, but no active bleeding was observed. Vitals remained stable. CT abdomen concerning for large volume of stool concerning for constipation and marked rectal distension. Also described loculated R pleural effusion. Hemoglobin 7.5.   The patient was admitted to the hospitalist service. GI was consulted and were planning for tagged RBC study, however, the patient decompensated in the early afternoon hours of 6/1. He developed respiratory depression and near respiratory arrest. PCCM was consulted.   Significant Hospital Events: Including procedures, antibiotic start and stop dates in addition to other pertinent events   5/31 admit for GIB 6/1 transfer to ICU/shock. EGD and Flex sig performed by GI. 6/2 no transfusion 6/4 hgb  6.5 transfused one unit with hgb up to 8.3 6/5 am  6/6 hemoglobin remained stable, started on Cardizem overnight and required low-dose Levophed.  Sputum with MRSA On Vanc cefepime and Flagyl day 5, abx changed to linezolid (for Corynebacterium coverage) and cefepime 6/7 Transitioned to amiodarone, remains on low dose Levophed  6/8 On amiodarone, off levophed around 1050 am.  MRI  pending. Tracking provider / eyes open  MRI brain was done which showed multiple small bilateral acute/subacute strokes, patient was transferred to [facility] for persistent encephalopathy 6/9 on and off pressors   Antibiotics: Zosyn 5/31-6/1 Linezolid 6/6 Flagyl 6/1- 6/5 Vancomycin 6/1- 6/6 Cefepime 6/1 >> 6/8 Linezolid 6/6 >>  Micro Respiratory culture 6/3 >> MRSA and Corynebacterium striatum> Cipro, erythro, oxacillin resistant MRSA screen 6/1 >> positive COVID-19 and flu 6/1 >> negative  Tubes/lines Right femoral CVC 6/1 >> Art line 6/9>>  Interim History / Subjective:  No overnight issues. Tolerated trach collar  Objective   Blood pressure 128/75, pulse 80, temperature (!) 97.5 F (36.4 C), temperature source Axillary, resp. rate (!) 23, height 6' (1.829 m), weight 101.6 kg, SpO2 100 %.    FiO2 (%):  [28 %-35 %] 28 %   Intake/Output Summary (Last 24 hours) at 02/13/2021 0947 Last data filed at 02/13/2021 0700 Gross per 24 hour  Intake 1979.42 ml  Output 3660 ml  Net -1680.58 ml   Filed Weights   02/11/21 0500 02/12/21 0256 02/13/21 0500  Weight: 102.1 kg 101.6 kg 101.6 kg    General: chronically ill appearing HEENT:    6.0 shiley cuffed trach Neuro: eyes open, able to wiggle toes slightly, does not follow my fingers CV:  RRR no mrg PULM: no increased work of breathing, no wheezes,  on trach collar GI: soft, peg in place Extremities: warm/dry Skin: Has multiple unstageable decubitus ulcer on shoulder, sacrum and buttocks, POA  Labs/imaging that I have personally reviewed  (right click and "Reselect all SmartList Selections" daily)  Na 147., K 4.8, Cl  120 Cr 1.68  Resolved Hospital Problem list   Hemorrhagic shock Septic Shock  Assessment & Plan:   Acute/subacute bilateral multifocal strokes Acute metabolic encephalopathy MRI brain confirmed small bilateral acute/subacute strokes likely embolic in origin Continue secondary stroke prophylaxis Suspect  encephalopathy secondary to critical illness, shock and AKI with stroke EEG shows diffuse slowing and metabolic encephalopathy. Neurology signed off.  Off all sedation since he's been at [facility]. Possibly following commands.   Acute on chronic hypoxic respiratory failure MRSA pneumonia Continue routine trach care, has #6 cuffed Shiley  Continue TC trials as tolerated - 24 hours this morning.   AKI Hyperkalemia Hypernatremia - increase free H20 flushes today  Acute GI bleed Patient presented with shock. S/p EGD and flex sig on 6/1 without stigmata of bleeding, EGD showed mild esophagitis, gastritis & few non-bleeding ulcers. Flex sig with poor prep, solid stool / no bleeding, possible rectal ulcer. GI signed off.  H&H remained stable Continue to monitor and transfuse for Hgb <7 Continue PPI Hold MiraLAX given diarrhea  Paroxysmal atrial fibrillation HTN Patient converted to sinus rhythm, now heart rate in 60s Amiodarone was stopped Holding metoprolol due to hypotension Cannot be anticoagulated due to recent GI bleeding  Baseline Quadriplegia Patient had a fall 11/2020 cervical fracture and subsequent epidural hematoma s/p evacuation and subsequent quadriplegia. C-Collar removed 6/7 after review with patient's neurosurgeon.  -appreciate Palliative Care input  -family requests full code > last family meeting family reported patient was awake prior to admit, hopeful for recovery.   Lung nodule -will need outpatient follow up    Best practice (right click and "Reselect all SmartList Selections" daily)  Diet:  Tube Feed  Pain/Anxiety/Delirium protocol (if indicated): No VAP protocol (if indicated): Yes DVT prophylaxis: SCD GI prophylaxis: PPI Glucose control:  SSI No Central venous access:  N/A   Arterial line: will d/c today Foley:  Yes, and it is still needed, chronic foley Mobility:  bed rest  PT consulted: N/A Last date of multidisciplinary goals of care discussion:  6/9, full scope of care. Consulting palliative care to assist with goals of care and complex medical decision making.  code Status:  full code Disposition: ICU  The patient is critically ill due to respiratory failure, encephalopathy.  Critical care was necessary to treat or prevent imminent or life-threatening deterioration.  Critical care was time spent personally by me on the following activities: development of treatment plan with patient and/or surrogate as well as nursing, discussions with consultants, evaluation of patient's response to treatment, examination of patient, obtaining history from patient or surrogate, ordering and performing treatments and interventions, ordering and review of laboratory studies, ordering and review of radiographic studies, pulse oximetry, re-evaluation of patient's condition and participation in multidisciplinary rounds.   Critical Care Time devoted to patient care services described in this note is 34 minutes. This time reflects time of care of this signee Charlott Holler . This critical care time does not reflect separately billable procedures or procedure time, teaching time or supervisory time of PA/NP/Med student/Med Resident etc but could involve care discussion time.       Charlott Holler [place] Pulmonary and Critical Care Medicine 02/13/2021 9:47 AM  Pager: see AMION  If no response to pager , please call critical care on call (see AMION) until 7pm After 7:00 pm call Elink

## 2021-02-14 LAB — BASIC METABOLIC PANEL
Anion gap: 7 (ref 5–15)
BUN: 84 mg/dL — ABNORMAL HIGH (ref 8–23)
CO2: 22 mmol/L (ref 22–32)
Calcium: 8.9 mg/dL (ref 8.9–10.3)
Chloride: 120 mmol/L — ABNORMAL HIGH (ref 98–111)
Creatinine, Ser: 1.58 mg/dL — ABNORMAL HIGH (ref 0.61–1.24)
GFR, Estimated: 46 mL/min — ABNORMAL LOW (ref >60)
Glucose, Bld: 105 mg/dL — ABNORMAL HIGH (ref 70–99)
Potassium: 4.7 mmol/L (ref 3.5–5.1)
Sodium: 149 mmol/L — ABNORMAL HIGH (ref 135–145)

## 2021-02-14 LAB — CBC
HCT: 27.6 % — ABNORMAL LOW (ref 39.0–52.0)
Hemoglobin: 8.4 g/dL — ABNORMAL LOW (ref 13.0–17.0)
MCH: 27.8 pg (ref 26.0–34.0)
MCHC: 30.4 g/dL (ref 30.0–36.0)
MCV: 91.4 fL (ref 80.0–100.0)
Platelets: 158 10*3/uL (ref 150–400)
RBC: 3.02 MIL/uL — ABNORMAL LOW (ref 4.22–5.81)
RDW: 22.4 % — ABNORMAL HIGH (ref 11.5–15.5)
WBC: 15.3 10*3/uL — ABNORMAL HIGH (ref 4.0–10.5)
nRBC: 0 % (ref 0.0–0.2)

## 2021-02-14 LAB — GLUCOSE, CAPILLARY
Glucose-Capillary: 101 mg/dL — ABNORMAL HIGH (ref 70–99)
Glucose-Capillary: 108 mg/dL — ABNORMAL HIGH (ref 70–99)
Glucose-Capillary: 126 mg/dL — ABNORMAL HIGH (ref 70–99)
Glucose-Capillary: 128 mg/dL — ABNORMAL HIGH (ref 70–99)
Glucose-Capillary: 115 mg/dL — ABNORMAL HIGH (ref 70–99)

## 2021-02-14 MED ORDER — ASPIRIN 81 MG PO CHEW
81.0000 mg | CHEWABLE_TABLET | Freq: Every day | ORAL | Status: DC
  Administered 2021-02-14 – 2021-02-15 (×2): 81 mg
  Filled 2021-02-14 (×2): qty 1

## 2021-02-14 MED ORDER — PANTOPRAZOLE SODIUM 40 MG PO PACK
40.0000 mg | PACK | Freq: Two times a day (BID) | ORAL | Status: DC
  Administered 2021-02-14 – 2021-02-15 (×2): 40 mg
  Filled 2021-02-14 (×2): qty 20

## 2021-02-14 MED ORDER — AMIODARONE HCL 200 MG PO TABS
200.0000 mg | ORAL_TABLET | Freq: Every day | ORAL | Status: DC
  Administered 2021-02-14 – 2021-02-15 (×2): 200 mg
  Filled 2021-02-14 (×2): qty 1

## 2021-02-14 MED ORDER — FREE WATER
400.0000 mL | Status: DC
  Administered 2021-02-14 – 2021-02-15 (×9): 400 mL

## 2021-02-14 MED ORDER — DAKINS (1/4 STRENGTH) 0.125 % EX SOLN
Freq: Every day | CUTANEOUS | Status: AC
  Filled 2021-02-14: qty 473

## 2021-02-14 NOTE — Progress Notes (Signed)
Wasted 100 cc ketamine and 425 cc versed with Francesca Jewett, RN at this time.

## 2021-02-14 NOTE — Progress Notes (Addendum)
I just spoke to [employer] (patient's daughter) on the phone and she told me she and her brothers absolutely do not want patient to be discharged back to [facility]. I forwarded this information to the charge RN Lauren. I also left Raynelle Fanning, Sports coach, a message at this time.

## 2021-02-14 NOTE — Discharge Summary (Signed)
Physician Discharge Summary         Patient ID: Justin Solomon MRN: [id number] DOB/AGE: Jan 06, 1951 70 y.o.  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 He was unable to be liberated from the ventilator and has since undergone tracheostomy.  He was discharged to [facility] in [place] for ongoing rehab.   On 5/31 he presented to [facility] ER from [facility] for evaluation of GI bleeding. RN at [facility] noted bright red blood with clots in his brief as he was being changed. In the ED there were clots noted in the rectal area, but no active bleeding was observed. Vitals remained stable. CT abdomen concerning for large volume of stool concerning for constipation and marked rectal distension. The patient was admitted to the hospitalist service. GI was consulted and were planning for tagged RBC study. Zosyn was started 5/31-6/1  In the early afternoon hours of 6/1, the patient decompensated. He developed respiratory depression and near respiratory arrest. PCCM was consulted and he was transferred to the ICU with Shock. An Aline and CVC were placed. The cuffless 6.0 trach was switched to a 6.0 cuffed trach and the patient was placed on the  ventilator. An EGD and Flex sig were completed by GI, with a few non bleeding duodenal ulcers, esophagitis, and gastritis seen on endoscopy. Flex sig showed normal DRE and no bleeding in recto-sigmoid colon. Vancomycin, cefepime, flagyl started.  On 6/4 1 U PRBC was given with hgb 6.5>8.3  On 6/6 Sputum cultures resulted MRSA and Corynebacterium Striatum (Cipro, erythro, oxacillin resistant) Antibiotics changed to linezolid (for Corynebacterium coverage) and cefepime. Cardizem was initiated for Afib with RVR and on 6/7 he was switched to amiodarone. CT head negative for acute findings  On 6/08 An MRI was obtained and showed multiple small bilateral acute/subacute strokes, [facility] and neurological service evaluation with stroke work up. Transcranial doppler US showed normal mean flow velocities, VS US carotid  right ICA and left ICA 1-39% stenosis., ECHO complete showed EF 55-60% RVSF mildly reduced, mild aortic valve regurgitation. EEG showed no seizure or epileptiform discharges.   On 6/10 Neurology signed off. Recommendations for ASA 81, ok to start [facility] from stroke standpoint, no need for permissive HTN, and needs ambulatory referral to neurology for stroke follow up. Antibiotic course completed. Vasopressors weaned off.  On 6/11 Weaned off ventilator to trach collar.  Discharge Plan by Active Problems    Acute/subacute bilateral multifocal strokes Acute metabolic encephalopathy -Continue ASA 81 daily -Continue neuroprotective measures- normothermia, euglycemia, HOB greater than 30, head in neutral alignment, normocapnia, normoxia.  -Continue secondary stroke prophylaxis   Acute on chronic hypoxic respiratory failure MRSA pneumonia On 21% TC -Continue humidified TC -Continue #6 Cuffed shiley -Continue Pulmonary toilet   AKI Hypernatremia- NA 010>272>536 Creat 1.68>1.58 -Free water flushes q4h. -Ensure renal perfusion.  Goal  MAP 65 or greater. -Avoid neprotoxic drugs as possible. -Strict I&O's -Follow up AM creatinine  Leukocytosis-Improving -Monitor on CBC. Monitor Fever/wbc curve   Acute GI bleed (esophagitis, gastritis & few non-bleeding duodenal ulcers) Acute Blood Loss Anemia -Transfuse PRBC if HBG less than 7 -Obtain AM CBC to trend H&H -Continue protonix 40mg  daily -Monitor for acute blood loss   Paroxysmal atrial fibrillation HTN -Amiodarone 200mg  per tube daily -Holding AC due to recent GIB.   Baseline Quadriplegia Patient had a fall 11/2020 cervical fracture and subsequent epidural hematoma s/p evacuation and subsequent quadriplegia. C-Collar removed 6/7 after review with patient's neurosurgeon.  -Patient needs to have daily bowel movements -SCD   Pressure Injuries: unstageable pressure injury to sacrum and buttocks Right scapula, healed full thickness  left scapula unstageable pressure injury Bilateral buttocks wounds -PT evaluation for hydrotherapy  -Dakins moist guaze to facilitate debridement (plan for three days per WOC) -Stanyl stopped by [facility] -WOC to reassess in three days after hydrotherapy and dakins dressings.  Lung nodule- Seen on 5/31 CT abdomen pelvis 18 mm nodular area of consolidation or nodule within the posterior left lung base. Consider one of the following in 3 months for both low-risk and high-risk individuals: (a) repeat chest CT, (b) follow-up PET-CT, or (c) tissue sampling. This recommendation follows the consensus statement: Guidelines for Management of Incidental Pulmonary Nodules Detected on CT Images: From the Fleischner Society 2017; Radiology 2017; 284:228-243. -Will need outpatient pulmonary follow up  Significant Hospital tests/ studies  See discharge summary  Procedures   See discharge summary  Tubes/lines Right femoral CVC 6/1 Art line 6/9 Culture data/antimicrobials   Antibiotics: Zosyn 5/31-6/1 Linezolid 6/6 Flagyl 6/1-  6/5 Vancomycin 6/1- 6/6 Cefepime 6/1 >> 6/8 Linezolid 6/6 >> 6/10   Micro Respiratory culture 6/3 >> MRSA and Corynebacterium striatum> Cipro, erythro, oxacillin resistant MRSA screen 6/1 >> positive COVID-19 and flu 6/1 >> negative   Consults  See discharge summary Gastrointestinal Neurology    Discharge Exam: BP (!) 150/94   Pulse 90   Temp (!) 97 F (36.1 C) (Axillary)   Resp (!) 27   Ht 6' (1.829 m)   Wt 101 kg   SpO2 100%   BMI 30.20 kg/m   General:  in bed, no acute distress, appears comfortable HEENT: MM pink/moist, anicteric, trach 6.0 shiley cuffed c/d/i Neuro: tracking with eyes, not following commands, RASS 0, PERRL 50mm, quadriplegia  CV: S1S2, NSR, no m/r/g appreciated PULM:  Clear in the upper lobes, in the lower lobes, scant secretions, chest expansion symmetric GI: soft, bsx4 active, peg in place c/d/i Extremities: warm/dry, no pretibial edema, capillary refill less than 3 seconds  Skin: pressure injury to sacrum and buttocks, right scapula, left scapula, bilateral buttocks wounds  Labs at discharge   Lab Results  Component Value Date   CREATININE 1.44 (H) 02/15/2021   BUN 75 (H) 02/15/2021   NA 146 (H) 02/15/2021   K 5.0 02/15/2021   CL 117 (H) 02/15/2021   CO2 23 02/15/2021   Lab Results  Component Value Date   WBC 16.3 (H) 02/15/2021   HGB 9.1 (L) 02/15/2021   HCT 30.0 (L) 02/15/2021   MCV 91.5 02/15/2021   PLT 191 02/15/2021   Lab Results  Component Value Date   ALT 365 (H) 02/10/2021   AST 81 (H) 02/10/2021   ALKPHOS 159 (H) 02/10/2021   BILITOT 0.6 02/10/2021   Lab Results  Component Value Date   INR 1.7 (H) 02/02/2021   INR  1.3 (H) 02/01/2021    Current radiological studies    No results found.  Disposition:    Discharge disposition: [facility]  Discharge Instructions     Discharge wound care:   Complete by: As directed    Cleanse wounds to sacrum, buttocks  and scapula with NS and apply Dakins moist kerlix to devitalized tissue in wound bed.  Cover with ABD pads and tape.  Change daily.  PT to evaluate for appropriateness of hydrotherapy. Please evaluate wounds to sacrum and buttocks and scapula. DTI evolved to darkened devitalized tissue.  Santyl not improving wound. I have ordered short term of Dakins to facilitate debridement.       Allergies as of 02/15/2021   No Known Allergies      Medication List     STOP taking these medications    Amantadine HCl 100 MG tablet   amLODipine 10 MG tablet Commonly known as: NORVASC   atorvastatin 80 MG tablet Commonly known as: LIPITOR   baclofen 5 mg Tabs tablet Commonly known as: LIORESAL   Carboxymethylcellulose Sod PF 0.5 % Soln   famotidine 20 MG tablet Commonly known as: PEPCID   fluconazole 100 MG tablet Commonly known as: DIFLUCAN   heparin 5000 UNIT/ML injection   LACTATED RINGERS IV   metoprolol tartrate 25 MG tablet Commonly known as: LOPRESSOR   traMADol 50 MG tablet Commonly known as: ULTRAM       TAKE these medications    acetaminophen 325 MG tablet Commonly known as: TYLENOL Place 2 tablets (650 mg total) into feeding tube every 6 (six) hours as needed for mild pain (or Fever >/= 101). What changed: reasons to take this   amiodarone 200 MG tablet Commonly known as: PACERONE Place 200 mg into feeding tube daily.   aspirin 81 MG chewable tablet Place 1 tablet (81 mg total) into feeding tube daily. Start taking on: February 16, 2021   chlorhexidine 0.12 % solution Commonly known as: PERIDEX 15 mLs by Mouth Rinse route 2 (two) times daily. What changed:  how much to take how to take this when to take this   Chlorhexidine Gluconate Cloth 2 % Pads Apply 6 each topically daily. Start taking on: February 16, 2021   docusate 50 MG/5ML liquid Commonly known as: COLACE Place 10 mLs (100 mg total) into feeding tube 2 (two) times daily.   feeding supplement  (PROSource TF) liquid Place 45 mLs into feeding tube 2 (two) times daily. What changed:  how much to take when to take this additional instructions   feeding supplement (OSMOLITE 1.5 CAL) Liqd Place 1,000 mLs into feeding tube continuous. What changed: You were already taking a medication with the same name, and this prescription was added. Make sure you understand how and when to take each.   free water Soln Place 400 mLs into feeding tube every 4 (four) hours.   insulin aspart 100 UNIT/ML injection Commonly known as: novoLOG Inject 0-9 Units into the skin every 4 (four) hours.   mouth rinse Liqd solution 15 mLs by Mouth Rinse route 2 times daily at 12 noon and 4 pm.   pantoprazole sodium 40 mg/20 mL Pack Commonly known as: PROTONIX Place 20 mLs (40 mg total) into feeding tube 2 (two) times daily.   polyethylene glycol 17 g packet Commonly known as: MIRALAX / GLYCOLAX Place 17 g into feeding tube daily as needed for mild constipation.  Discharge Care Instructions  (From admission, onward)           Start     Ordered   02/15/21 0000  Discharge wound care:       Comments: Cleanse wounds to sacrum, buttocks and scapula with NS and apply Dakins moist kerlix to devitalized tissue in wound bed.  Cover with ABD pads and tape.  Change daily.  PT to evaluate for appropriateness of hydrotherapy. Please evaluate wounds to sacrum and buttocks and scapula. DTI evolved to darkened devitalized tissue.  Santyl not improving wound. I have ordered short term of Dakins to facilitate debridement.   02/15/21 1441             Follow-up appointment    Discharge Condition:    stable  Statement:   The Patient was personally examined, the discharge assessment and plan has been personally reviewed and I agree with ACNP Teesha Ohm's assessment and plan. 29  minutes of time have been dedicated to discharge assessment, planning and discharge instructions.   Gershon Musselimothy Scott  Walfred Bettendorf, Jr., MSN, APRN, AGACNP-BC [facility]  02/15/2021 , 2:43 PM  Please see [email or web address] for pager details  If no response, please call [phone] After hours, please call Elink at [phone]

## 2021-02-14 NOTE — Consult Note (Signed)
WOC Nurse Consult Note: Reason for Consult:Unstageable pressure injuries to bilateral buttocks and sacrum.  Wound type: unstageable pressure injury to sacrum and buttocks Bilateral scapula pressure injuries.  Right scapula, healed full thickness left scapula unstageable pressure injury Pressure Injury POA: No Patient has been on a mattress with low air loss feature since admission.  He is being turned and repositioned every two hours.  Due to GI bleed, sacrum and buttocks were exposed to heme positive frequent loose stools.  Flexiseal fecal manager in place    Measurement:sacrum:  6cm x 4 cm with dark devitalized center Bilateral buttocks wounds measure 11 cm x 8 cm each side and is connected to the sacral injury Left scapula over bony prominence injury with darkened center and peeling epithelium continues to evolve. 8 cm x 8 cm  Wound bed: devitalized tissue Drainage (amount, consistency, odor) moderate serosanguinous  necrotic odor.  Periwound: nonblanchable erythema. Dressing procedure/placement/frequency:  I will order PT eval for hydrotherapy and initiate 3 days of Dakins moist gauze to facilitate debridement.  Albumin is 2.0 and wounds not improving with Santyl.  I will discontinue this for that reason.   Will follow and assess again after hydrotherapy and Dakins.  Maple Hudson MSN, RN, FNP-BC CWON Wound, Ostomy, Continence Nurse Pager [phone]

## 2021-02-14 NOTE — Progress Notes (Addendum)
NAME:  YAQUB ARNEY, MRN:  [id number], DOB:  10-07-1950, LOS: 12 ADMISSION DATE:  02/01/2021, CONSULTATION DATE: 6/1 REFERRING MD: Dr. Toniann Fail TRH, CHIEF COMPLAINT: GI bleed  Brief History:  70 year old male with complicated recent past medical history significant for admission outside hospital back in March 2022 after a fall.  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   He is also unable to be liberated from the ventilator and has since undergone tracheostomy.  He was discharged to [facility] in [place] for ongoing rehab. On 5/31 he presented to [facility] ER from [facility] for evaluation of GI bleeding. RN at [facility] noted bright red blood with clots in his brief as he was being changed. In the ED there were clots noted in the rectal area, but no active bleeding was observed. Vitals remained stable. CT abdomen concerning for large volume of stool concerning for constipation and marked rectal distension. Also described loculated R pleural effusion. Hemoglobin 7.5.   The patient was admitted to the hospitalist service. GI was consulted and were planning for tagged RBC study, however, the patient decompensated in the early afternoon hours of 6/1. He developed respiratory depression and near respiratory arrest. PCCM was consulted.   Significant Hospital Events: Including procedures, antibiotic start and stop dates in addition to other pertinent events   5/31 admit for GIB 6/1 transfer to ICU/shock. EGD and Flex sig performed by GI. 6/2 no transfusion 6/4 hgb  6.5 transfused one unit with hgb up to 8.3 6/5 am  6/6 hemoglobin remained stable, started on Cardizem overnight and required low-dose Levophed.  Sputum with MRSA On Vanc cefepime and Flagyl day 5, abx changed to linezolid (for Corynebacterium coverage) and cefepime 6/7 Transitioned to amiodarone, remains on low dose Levophed  6/8 On amiodarone, off levophed around 1050 am.  MRI  pending. Tracking provider / eyes open  MRI brain was done which showed multiple small bilateral acute/subacute strokes, patient was transferred to [facility] for persistent encephalopathy 6/9 on and off pressors   Antibiotics: Zosyn 5/31-6/1 Linezolid 6/6 Flagyl 6/1- 6/5 Vancomycin 6/1- 6/6 Cefepime 6/1 >> 6/8 Linezolid 6/6 >> 6/10  Micro Respiratory culture 6/3 >> MRSA and Corynebacterium striatum> Cipro, erythro, oxacillin resistant MRSA screen 6/1 >> positive COVID-19 and flu 6/1 >> negative  Tubes/lines Right femoral CVC 6/1 Art line 6/9  Interim History / Subjective:  Tmax 98.8  - yesterday  2.6 L uop 100 stool, + unmeasured BM  No drips  Unable to obtain subjective evaluation due to patient status   Objective   Blood pressure 130/77, pulse 88, temperature 97.6 F (36.4 C), temperature source Oral, resp. rate (!) 22, height 6' (1.829 m), weight 101 kg, SpO2 100 %. On 28% TC    FiO2 (%):  [28 %] 28 %   Intake/Output Summary (Last 24 hours) at 02/14/2021 0736 Last data filed at 02/14/2021 0600 Gross per 24 hour  Intake 2659.88 ml  Output 2770 ml  Net -110.12 ml   Filed Weights   02/12/21 0256 02/13/21 0500 02/14/21 0401  Weight: 101.6 kg 101.6 kg 101 kg    General:  ill appearing, in bed, no acute distress HEENT: MM pink/moist, icteric/anicteric, 6.0 Shiley cuffed, c/d/i Neuro: Not following commands, opens eyes to pain, quadriplegic CV: S1S2, NSR on monitor, no m/r/g appreciated PULM:  air movement in all lobes, scant secretions, chest expansion symmetric GI: soft, bsx4 active, non distended  Extremities: warm/dry, generalized edema, capillary refill less  than 3 seconds  Skin: PI to rectum, head, L and R shoulder, buttocks   Labs/imaging that I have personally reviewed  (right click and "Reselect all SmartList Selections" daily)  BMP- Hyper NA, Creat down BMP- Leukocytosis  Resolved Hospital Problem list   Hemorrhagic shock Septic  Shock  Assessment & Plan:   Acute/subacute bilateral multifocal strokes Acute metabolic encephalopathy MRI brain confirmed small bilateral acute/subacute strokes likely embolic in origin. EEG shows diffuse slowing and metabolic encephalopathy. Remains off sedation, Neurology signed off on 6/10 -Start ASA 81 daily per neuro recs, discussed with Dr. Denese Killings  -Continue neuroprotective measures- normothermia, euglycemia, HOB greater than 30, head in neutral alignment, normocapnia, normoxia.  -Continue secondary stroke prophylaxis.   Acute on chronic hypoxic respiratory failure MRSA pneumonia -#6 Cuffed shiley -Continue TC Trials -Wean FiO2 to goal. Goal SPO2 92-98% -Trach care per orders -Continue pulmonary toilet as able.  AKI Hyperkalemia-Resolved Hypernatremia- NA 147>157 Creat 1.68>1.58 -Free water flushes increased from 200 to q4h -Ensure renal perfusion. Goal MAP 65 or greater. -Avoid neprotoxic drugs as possible. -Strict I&O's -Follow up AM creatinine  Leukocytosis 20>16>15, afebrile, steroids stopped 6/11 -Continue to trend on CBC -Follow Fever WBC curve  Acute GI bleed-Resolving Acute Blood Loss Anemia Patient presented with shock. S/p EGD and flex sig on 6/1 without stigmata of bleeding, EGD showed mild esophagitis, gastritis & few non-bleeding ulcers. Flex sig with poor prep, solid stool / no bleeding, possible rectal ulcer. GI signed off. HCT 7.4>8.4>8.4 -Continue to monitor for blood loss -Transfuse PRBC if HBG less than 7 -Obtain AM CBC to trend H&H -Continue PPI -Continue bowel regimen  Paroxysmal atrial fibrillation HTN Patient converted to sinus rhythm, now heart rate in 60s -Resumed Amioadarone 200mg  per tube daily -Continue holding metoprolol. -Continue to hold AC due to recent GIB   Baseline Quadriplegia Patient had a fall 11/2020 cervical fracture and subsequent epidural hematoma s/p evacuation and subsequent quadriplegia. C-Collar removed 6/7  after review with patient's neurosurgeon.  -Appreciate PC assistance -Full code per family. Goal to get patient back to facility. -Having daily bowel movements. Needs to have BM daily.  Pressure Injuries -Appreciate WOC assistance -Wound care per WOC  Lung nodule -Will need outpatient follow up    Best practice (right click and "Reselect all SmartList Selections" daily)  Diet:  Tube Feed  Pain/Anxiety/Delirium protocol (if indicated): No VAP protocol (if indicated): Yes DVT prophylaxis: SCD GI prophylaxis: PPI Glucose control:  SSI Yes Central venous access:  N/A   Arterial line: will d/c today Foley:  Yes, and it is still needed, chronic foley Mobility:  bed rest  PT consulted: N/A Last date of multidisciplinary goals of care discussion: 5/12 PC discussion with family. Wants full scope of support. Wants to return to [facility]. code Status:  full code Disposition: Progressive vs facility   Critical Care Time: N/A  [facility] MSN, APRN, AGACNP-BC [facility]  02/14/2021 , 7:43 AM  Please see [email or web address] for pager details  If no response, please call [phone] After hours, please call Elink at [phone]

## 2021-02-14 NOTE — TOC Progression Note (Addendum)
Transition of Care [facility]) - Progression Note    Patient Details  Name: Justin Solomon MRN: [id number] Date of Birth: Jun 03, 1951  Transition of Care [facility]) CM/SW Contact  Baldemar Lenis, [place] Phone Number: 02/14/2021, 1:12 PM  Clinical Narrative:   CSW acknowledging consult for LTAC placement. CSW reached out to [facility] for them to review for appropriateness of LTAC placement. CSW to follow.  UPDATE: [facility] can take the patient tomorrow, if medically stable. No new covid test needed, per Irving Burton. CSW to follow.    Expected Discharge Plan: Skilled Nursing Facility ([facility]) Barriers to Discharge: Continued Medical Work up  Expected Discharge Plan and Services Expected Discharge Plan: Skilled Nursing Facility ([facility])       Living arrangements for the past 2 months: Single Family Home                                       Social Determinants of Health (SDOH) Interventions    Readmission Risk Interventions No flowsheet data found.

## 2021-02-14 NOTE — Progress Notes (Signed)
Spoke to patient's daughter on the phone at this time regarding updates on patient's status.

## 2021-02-14 NOTE — Progress Notes (Signed)
Patient desat to 85% on medical air. Called Ayoob, RT. I tracheal suctioned patient x2 and he went up to 92%. Ayoob, RT suctioned patient again and put patient back on 30% oxygen trach collar. Patient now 97% SpO2.

## 2021-02-15 ENCOUNTER — Inpatient Hospital Stay
Admission: AD | Admit: 2021-02-15 | Discharge: 2021-03-18 | Disposition: A | Discharge status: Skilled Nursing Facility | Payer: Medicare Other | Source: Other Acute Inpatient Hospital | Attending: Internal Medicine | Admitting: Internal Medicine

## 2021-02-15 ENCOUNTER — Other Ambulatory Visit (HOSPITAL_COMMUNITY): Payer: Self-pay

## 2021-02-15 DIAGNOSIS — N179 Acute kidney failure, unspecified: Secondary | ICD-10-CM | POA: Diagnosis present

## 2021-02-15 DIAGNOSIS — Z930 Tracheostomy status: Secondary | ICD-10-CM

## 2021-02-15 DIAGNOSIS — Z931 Gastrostomy status: Secondary | ICD-10-CM

## 2021-02-15 DIAGNOSIS — Z95828 Presence of other vascular implants and grafts: Secondary | ICD-10-CM

## 2021-02-15 DIAGNOSIS — J9690 Respiratory failure, unspecified, unspecified whether with hypoxia or hypercapnia: Secondary | ICD-10-CM

## 2021-02-15 DIAGNOSIS — J9600 Acute respiratory failure, unspecified whether with hypoxia or hypercapnia: Secondary | ICD-10-CM

## 2021-02-15 DIAGNOSIS — J15212 Pneumonia due to Methicillin resistant Staphylococcus aureus: Secondary | ICD-10-CM | POA: Diagnosis present

## 2021-02-15 DIAGNOSIS — R0902 Hypoxemia: Secondary | ICD-10-CM

## 2021-02-15 DIAGNOSIS — I4891 Unspecified atrial fibrillation: Secondary | ICD-10-CM | POA: Diagnosis present

## 2021-02-15 DIAGNOSIS — J9621 Acute and chronic respiratory failure with hypoxia: Secondary | ICD-10-CM

## 2021-02-15 LAB — CBC
HCT: 30 % — ABNORMAL LOW (ref 39.0–52.0)
Hemoglobin: 9.1 g/dL — ABNORMAL LOW (ref 13.0–17.0)
MCH: 27.7 pg (ref 26.0–34.0)
MCHC: 30.3 g/dL (ref 30.0–36.0)
MCV: 91.5 fL (ref 80.0–100.0)
Platelets: 191 10*3/uL (ref 150–400)
RBC: 3.28 MIL/uL — ABNORMAL LOW (ref 4.22–5.81)
RDW: 22.1 % — ABNORMAL HIGH (ref 11.5–15.5)
WBC: 16.3 10*3/uL — ABNORMAL HIGH (ref 4.0–10.5)
nRBC: 0.1 % (ref 0.0–0.2)

## 2021-02-15 LAB — GLUCOSE, CAPILLARY
Glucose-Capillary: 126 mg/dL — ABNORMAL HIGH (ref 70–99)
Glucose-Capillary: 114 mg/dL — ABNORMAL HIGH (ref 70–99)
Glucose-Capillary: 88 mg/dL (ref 70–99)
Glucose-Capillary: 119 mg/dL — ABNORMAL HIGH (ref 70–99)
Glucose-Capillary: 121 mg/dL — ABNORMAL HIGH (ref 70–99)

## 2021-02-15 LAB — BASIC METABOLIC PANEL
Anion gap: 6 (ref 5–15)
BUN: 75 mg/dL — ABNORMAL HIGH (ref 8–23)
CO2: 23 mmol/L (ref 22–32)
Calcium: 8.7 mg/dL — ABNORMAL LOW (ref 8.9–10.3)
Chloride: 117 mmol/L — ABNORMAL HIGH (ref 98–111)
Creatinine, Ser: 1.44 mg/dL — ABNORMAL HIGH (ref 0.61–1.24)
GFR, Estimated: 52 mL/min — ABNORMAL LOW (ref >60)
Glucose, Bld: 130 mg/dL — ABNORMAL HIGH (ref 70–99)
Potassium: 5 mmol/L (ref 3.5–5.1)
Sodium: 146 mmol/L — ABNORMAL HIGH (ref 135–145)

## 2021-02-15 MED ORDER — DOCUSATE SODIUM 50 MG/5ML PO LIQD: 100.0000 mg | Freq: Two times a day (BID) | ORAL | 0 refills | Status: AC

## 2021-02-15 MED ORDER — ORAL CARE MOUTH RINSE: 15.0000 mL | Freq: Two times a day (BID) | OROMUCOSAL | 0 refills | Status: DC

## 2021-02-15 MED ORDER — PANTOPRAZOLE SODIUM 40 MG PO PACK: 40.0000 mg | PACK | Freq: Two times a day (BID) | ORAL | Status: DC

## 2021-02-15 MED ORDER — FREE WATER: 400.0000 mL | Status: AC

## 2021-02-15 MED ORDER — OSMOLITE 1.5 CAL PO LIQD: 1000.0000 mL | ORAL | 0 refills | Status: AC

## 2021-02-15 MED ORDER — PROSOURCE TF PO LIQD: 45.0000 mL | Freq: Two times a day (BID) | ORAL | Status: AC

## 2021-02-15 MED ORDER — INSULIN ASPART 100 UNIT/ML IJ SOLN: 0.0000 [IU] | INTRAMUSCULAR | 11 refills | Status: AC

## 2021-02-15 MED ORDER — POLYETHYLENE GLYCOL 3350 17 G PO PACK: 17.0000 g | PACK | Freq: Every day | ORAL | 0 refills | Status: AC | PRN

## 2021-02-15 MED ORDER — CHLORHEXIDINE GLUCONATE 0.12 % MT SOLN: 15.0000 mL | Freq: Two times a day (BID) | OROMUCOSAL | 0 refills | Status: AC

## 2021-02-15 MED ORDER — CHLORHEXIDINE GLUCONATE CLOTH 2 % EX PADS: 6.0000 | MEDICATED_PAD | Freq: Every day | CUTANEOUS | Status: AC

## 2021-02-15 MED ORDER — DIATRIZOATE MEGLUMINE & SODIUM 66-10 % PO SOLN
ORAL | Status: AC
  Filled 2021-02-15: qty 30

## 2021-02-15 MED ORDER — ASPIRIN 81 MG PO CHEW: 81.0000 mg | CHEWABLE_TABLET | Freq: Every day | ORAL | Status: AC

## 2021-02-15 MED ORDER — ACETAMINOPHEN 325 MG PO TABS
650.0000 mg | ORAL_TABLET | Freq: Four times a day (QID) | ORAL | Status: AC | PRN
Start: 2021-02-15 — End: ?

## 2021-02-15 MED ORDER — DIATRIZOATE MEGLUMINE & SODIUM 66-10 % PO SOLN
50.0000 mL | Freq: Once | ORAL | Status: DC
Start: 2021-02-15 — End: 2021-03-19

## 2021-02-15 NOTE — TOC Progression Note (Signed)
Transition of Care [facility]) - Progression Note    Patient Details  Name: Justin Solomon MRN: [id number] Date of Birth: [birth year]  Transition of [facility]) CM/SW Contact  Astrid Drafts Berna Spare, RN Phone Number: 02/15/2021, 12:26 PM  Clinical Narrative:   Spoke with patient's daughter, Danna Hefty, regarding return to [facility]. Danna Hefty states that family desires that patient not return to [facility], and are interested in [facility] [place].  Spoke with Alvino Chapel, admissions liaison for [facility]; they will complete an evaluation and notify [facility] Case Manager if they are able to assist. Will provide updates as available.    Expected Discharge Plan: Skilled Nursing Facility ([facility]) Barriers to Discharge: Continued Medical Work up  Expected Discharge Plan and Services Expected Discharge Plan: Skilled Nursing Facility ([facility])   Discharge Planning Services: CM Consult   Living arrangements for the past 2 months: Single Family Home                                       Social Determinants of Health (SDOH) Interventions    Readmission Risk Interventions No flowsheet data found.  Quintella Baton, RN, BSN  Trauma/Neuro ICU Case Manager [phone]

## 2021-02-15 NOTE — Progress Notes (Signed)
Gave report to [facility] RN at this time.

## 2021-02-15 NOTE — TOC Transition Note (Addendum)
Transition of Care [facility]) - CM/SW Discharge Note   Patient Details  Name: Justin Solomon MRN: [id number] Date of Birth: 1951-06-08  Transition of Care [facility]) CM/SW Contact:  Glennon Mac, RN Phone Number: 02/15/2021, 2:11 PM   Clinical Narrative: Patient appropriate for admission to [facility], per [place] in admissions.  Notified patient's daughter, Danna Hefty, that patient can transfer to [facility].  She has checked with the rest of her family, and they are agreeable to transfer to [facility].  [facility] admissions liaison is aware that patient will not return to their facility. Patient is medically stable for transfer today; have notified medical provider of need for DC summary and order.  Accepting MD is Dr. Luna Kitchens; nurse will need to call report to [phone]. Patient going to room 5E24.    Final next level of care: Long Term Acute Care (LTAC) Barriers to Discharge: Barriers Resolved   Patient Goals and CMS Choice Patient states their goals for this hospitalization and ongoing recovery are:: unable to state vent and arterial lines in place CMS Medicare.gov Compare Post Acute Care list provided to:: Patient Represenative (must comment) (daughter) Choice offered to / list presented to : Adult Children                      Discharge Plan and Services   Discharge Planning Services: CM Consult Post Acute Care Choice: Long Term Acute Care (LTAC)                               Social Determinants of Health (SDOH) Interventions     Readmission Risk Interventions No flowsheet data found.  Quintella Baton, RN, BSN  Trauma/Neuro ICU Case Manager [phone]

## 2021-02-15 NOTE — Evaluation (Signed)
Physical Therapy Hydrotherapy Evaluation Patient Details Name: Justin Solomon MRN: [id number] DOB: 02/24/1951 Today's Date: 02/15/2021   History of Present Illness  Pt is a 70 y/o male who presents to hydrotherapy with L scapular DTI and sacral DTI. 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 He is also unable to be liberated from the ventilator and has since undergone tracheostomy.   Clinical Impression  At the time of Hydrotherapy evaluation, wounds appeared to be evolving and still considered deep tissue injuries. Surface tissue black but not appropriate for pulse lavage and sharp debridement. Hydrotherapy will check back on Monday 6/20 to reassess. Foam dressings placed over B scapular wounds and sacral wound. RN updated. Family education provided on natural wound evolution and hydrotherapy's role. Pt was repositioned in bed prior to hydrotherapy exiting. Will follow-up on 6/20.       Time: 6734-1937 PT Time Calculation (min) (ACUTE ONLY): 28 min   Charges:     PT Treatments $Self Care/Home Management: 23-37        Conni Slipper, PT, DPT Acute Rehabilitation Services Pager: [phone] Office: [phone]   Marylynn Pearson 02/15/2021, 12:04 PM

## 2021-02-15 NOTE — Progress Notes (Signed)
I attempted to call report to the nurse at [facility] at this time. Nurse is busy and will give me a call back.

## 2021-02-16 LAB — CBC WITH DIFFERENTIAL/PLATELET
Abs Immature Granulocytes: 0.13 10*3/uL — ABNORMAL HIGH (ref 0.00–0.07)
Basophils Absolute: 0 10*3/uL (ref 0.0–0.1)
Basophils Relative: 0 %
Eosinophils Absolute: 0.1 10*3/uL (ref 0.0–0.5)
Eosinophils Relative: 1 %
HCT: 29.3 % — ABNORMAL LOW (ref 39.0–52.0)
Hemoglobin: 8.7 g/dL — ABNORMAL LOW (ref 13.0–17.0)
Immature Granulocytes: 1 %
Lymphocytes Relative: 8 %
Lymphs Abs: 1.2 10*3/uL (ref 0.7–4.0)
MCH: 27.2 pg (ref 26.0–34.0)
MCHC: 29.7 g/dL — ABNORMAL LOW (ref 30.0–36.0)
MCV: 91.6 fL (ref 80.0–100.0)
Monocytes Absolute: 0.7 10*3/uL (ref 0.1–1.0)
Monocytes Relative: 5 %
Neutro Abs: 13.3 10*3/uL — ABNORMAL HIGH (ref 1.7–7.7)
Neutrophils Relative %: 85 %
Platelets: 187 10*3/uL (ref 150–400)
RBC: 3.2 MIL/uL — ABNORMAL LOW (ref 4.22–5.81)
RDW: 22 % — ABNORMAL HIGH (ref 11.5–15.5)
WBC: 15.5 10*3/uL — ABNORMAL HIGH (ref 4.0–10.5)
nRBC: 0 % (ref 0.0–0.2)

## 2021-02-16 LAB — COMPREHENSIVE METABOLIC PANEL
ALT: 107 U/L — ABNORMAL HIGH (ref 0–44)
AST: 29 U/L (ref 15–41)
Albumin: 2 g/dL — ABNORMAL LOW (ref 3.5–5.0)
Alkaline Phosphatase: 110 U/L (ref 38–126)
Anion gap: 9 (ref 5–15)
BUN: 63 mg/dL — ABNORMAL HIGH (ref 8–23)
CO2: 23 mmol/L (ref 22–32)
Calcium: 9 mg/dL (ref 8.9–10.3)
Chloride: 116 mmol/L — ABNORMAL HIGH (ref 98–111)
Creatinine, Ser: 1.21 mg/dL (ref 0.61–1.24)
GFR, Estimated: >60 mL/min (ref >60)
Glucose, Bld: 80 mg/dL (ref 70–99)
Potassium: 5 mmol/L (ref 3.5–5.1)
Sodium: 148 mmol/L — ABNORMAL HIGH (ref 135–145)
Total Bilirubin: 0.9 mg/dL (ref 0.3–1.2)
Total Protein: 6.6 g/dL (ref 6.5–8.1)

## 2021-02-17 LAB — HEMOGLOBIN A1C
Hgb A1c MFr Bld: 5.5 % (ref 4.8–5.6)
Mean Plasma Glucose: 111 mg/dL

## 2021-02-18 LAB — CBC
HCT: 27.7 % — ABNORMAL LOW (ref 39.0–52.0)
Hemoglobin: 8.1 g/dL — ABNORMAL LOW (ref 13.0–17.0)
MCH: 27.5 pg (ref 26.0–34.0)
MCHC: 29.2 g/dL — ABNORMAL LOW (ref 30.0–36.0)
MCV: 93.9 fL (ref 80.0–100.0)
Platelets: 229 10*3/uL (ref 150–400)
RBC: 2.95 MIL/uL — ABNORMAL LOW (ref 4.22–5.81)
RDW: 22.3 % — ABNORMAL HIGH (ref 11.5–15.5)
WBC: 20.6 10*3/uL — ABNORMAL HIGH (ref 4.0–10.5)
nRBC: 0 % (ref 0.0–0.2)

## 2021-02-18 LAB — BASIC METABOLIC PANEL
Anion gap: 8 (ref 5–15)
BUN: 50 mg/dL — ABNORMAL HIGH (ref 8–23)
CO2: 26 mmol/L (ref 22–32)
Calcium: 9 mg/dL (ref 8.9–10.3)
Chloride: 111 mmol/L (ref 98–111)
Creatinine, Ser: 1.14 mg/dL (ref 0.61–1.24)
GFR, Estimated: >60 mL/min (ref >60)
Glucose, Bld: 130 mg/dL — ABNORMAL HIGH (ref 70–99)
Potassium: 4.5 mmol/L (ref 3.5–5.1)
Sodium: 145 mmol/L (ref 135–145)

## 2021-02-20 ENCOUNTER — Other Ambulatory Visit (HOSPITAL_COMMUNITY): Payer: Self-pay

## 2021-02-20 LAB — CBC
HCT: 24.2 % — ABNORMAL LOW (ref 39.0–52.0)
Hemoglobin: 6.8 g/dL — CL (ref 13.0–17.0)
MCH: 27.5 pg (ref 26.0–34.0)
MCHC: 28.1 g/dL — ABNORMAL LOW (ref 30.0–36.0)
MCV: 98 fL (ref 80.0–100.0)
Platelets: 215 10*3/uL (ref 150–400)
RBC: 2.47 MIL/uL — ABNORMAL LOW (ref 4.22–5.81)
RDW: 22.3 % — ABNORMAL HIGH (ref 11.5–15.5)
WBC: 13.3 10*3/uL — ABNORMAL HIGH (ref 4.0–10.5)
nRBC: 0.2 % (ref 0.0–0.2)

## 2021-02-20 LAB — PREPARE RBC (CROSSMATCH)

## 2021-02-21 LAB — TYPE AND SCREEN
ABO/RH(D): O POS
Antibody Screen: NEGATIVE
Unit division: 0

## 2021-02-21 LAB — HEMOGLOBIN AND HEMATOCRIT, BLOOD
HCT: 28.4 % — ABNORMAL LOW (ref 39.0–52.0)
Hemoglobin: 8.2 g/dL — ABNORMAL LOW (ref 13.0–17.0)

## 2021-02-21 LAB — BPAM RBC
Blood Product Expiration Date: 202207122359
ISSUE DATE / TIME: 202206190811
Unit Type and Rh: 5100

## 2021-02-23 LAB — BASIC METABOLIC PANEL
Anion gap: 7 (ref 5–15)
BUN: 44 mg/dL — ABNORMAL HIGH (ref 8–23)
CO2: 31 mmol/L (ref 22–32)
Calcium: 9.5 mg/dL (ref 8.9–10.3)
Chloride: 103 mmol/L (ref 98–111)
Creatinine, Ser: 0.93 mg/dL (ref 0.61–1.24)
GFR, Estimated: >60 mL/min (ref >60)
Glucose, Bld: 97 mg/dL (ref 70–99)
Potassium: 5.6 mmol/L — ABNORMAL HIGH (ref 3.5–5.1)
Sodium: 141 mmol/L (ref 135–145)

## 2021-02-23 LAB — MAGNESIUM: Magnesium: 1.5 mg/dL — ABNORMAL LOW (ref 1.7–2.4)

## 2021-02-24 LAB — MAGNESIUM: Magnesium: 1.7 mg/dL (ref 1.7–2.4)

## 2021-02-24 LAB — POTASSIUM: Potassium: 4.6 mmol/L (ref 3.5–5.1)

## 2021-02-25 LAB — MAGNESIUM: Magnesium: 1.9 mg/dL (ref 1.7–2.4)

## 2021-02-28 LAB — CBC
HCT: 26.3 % — ABNORMAL LOW (ref 39.0–52.0)
Hemoglobin: 7.6 g/dL — ABNORMAL LOW (ref 13.0–17.0)
MCH: 27.3 pg (ref 26.0–34.0)
MCHC: 28.9 g/dL — ABNORMAL LOW (ref 30.0–36.0)
MCV: 94.6 fL (ref 80.0–100.0)
Platelets: 229 10*3/uL (ref 150–400)
RBC: 2.78 MIL/uL — ABNORMAL LOW (ref 4.22–5.81)
RDW: 19.6 % — ABNORMAL HIGH (ref 11.5–15.5)
WBC: 13.2 10*3/uL — ABNORMAL HIGH (ref 4.0–10.5)
nRBC: 0 % (ref 0.0–0.2)

## 2021-02-28 LAB — BASIC METABOLIC PANEL
Anion gap: 6 (ref 5–15)
BUN: 39 mg/dL — ABNORMAL HIGH (ref 8–23)
CO2: 28 mmol/L (ref 22–32)
Calcium: 9.6 mg/dL (ref 8.9–10.3)
Chloride: 104 mmol/L (ref 98–111)
Creatinine, Ser: 0.79 mg/dL (ref 0.61–1.24)
GFR, Estimated: >60 mL/min (ref >60)
Glucose, Bld: 108 mg/dL — ABNORMAL HIGH (ref 70–99)
Potassium: 4.4 mmol/L (ref 3.5–5.1)
Sodium: 138 mmol/L (ref 135–145)

## 2021-02-28 LAB — MAGNESIUM: Magnesium: 1.4 mg/dL — ABNORMAL LOW (ref 1.7–2.4)

## 2021-03-01 LAB — MAGNESIUM: Magnesium: 1.9 mg/dL (ref 1.7–2.4)

## 2021-03-03 LAB — CBC
HCT: 24.6 % — ABNORMAL LOW (ref 39.0–52.0)
Hemoglobin: 7.4 g/dL — ABNORMAL LOW (ref 13.0–17.0)
MCH: 28 pg (ref 26.0–34.0)
MCHC: 30.1 g/dL (ref 30.0–36.0)
MCV: 93.2 fL (ref 80.0–100.0)
Platelets: 260 10*3/uL (ref 150–400)
RBC: 2.64 MIL/uL — ABNORMAL LOW (ref 4.22–5.81)
RDW: 19.4 % — ABNORMAL HIGH (ref 11.5–15.5)
WBC: 10 10*3/uL (ref 4.0–10.5)
nRBC: 0 % (ref 0.0–0.2)

## 2021-03-03 LAB — MAGNESIUM: Magnesium: 1.5 mg/dL — ABNORMAL LOW (ref 1.7–2.4)

## 2021-03-03 LAB — BASIC METABOLIC PANEL
Anion gap: 8 (ref 5–15)
BUN: 38 mg/dL — ABNORMAL HIGH (ref 8–23)
CO2: 24 mmol/L (ref 22–32)
Calcium: 9.7 mg/dL (ref 8.9–10.3)
Chloride: 101 mmol/L (ref 98–111)
Creatinine, Ser: 0.77 mg/dL (ref 0.61–1.24)
GFR, Estimated: >60 mL/min (ref >60)
Glucose, Bld: 101 mg/dL — ABNORMAL HIGH (ref 70–99)
Potassium: 5.2 mmol/L — ABNORMAL HIGH (ref 3.5–5.1)
Sodium: 133 mmol/L — ABNORMAL LOW (ref 135–145)

## 2021-03-05 LAB — BASIC METABOLIC PANEL
Anion gap: 6 (ref 5–15)
BUN: 32 mg/dL — ABNORMAL HIGH (ref 8–23)
CO2: 29 mmol/L (ref 22–32)
Calcium: 9.7 mg/dL (ref 8.9–10.3)
Chloride: 97 mmol/L — ABNORMAL LOW (ref 98–111)
Creatinine, Ser: 0.68 mg/dL (ref 0.61–1.24)
GFR, Estimated: >60 mL/min (ref >60)
Glucose, Bld: 106 mg/dL — ABNORMAL HIGH (ref 70–99)
Potassium: 4.6 mmol/L (ref 3.5–5.1)
Sodium: 132 mmol/L — ABNORMAL LOW (ref 135–145)

## 2021-03-05 LAB — MAGNESIUM: Magnesium: 1.6 mg/dL — ABNORMAL LOW (ref 1.7–2.4)

## 2021-03-06 LAB — MAGNESIUM: Magnesium: 1.6 mg/dL — ABNORMAL LOW (ref 1.7–2.4)

## 2021-03-07 LAB — URINALYSIS, ROUTINE W REFLEX MICROSCOPIC
Bilirubin Urine: NEGATIVE
Glucose, UA: NEGATIVE mg/dL
Ketones, ur: NEGATIVE mg/dL
Nitrite: NEGATIVE
Protein, ur: 30 mg/dL — AB
Specific Gravity, Urine: 1.013 (ref 1.005–1.030)
WBC, UA: >50 WBC/hpf — ABNORMAL HIGH (ref 0–5)
pH: 5 (ref 5.0–8.0)

## 2021-03-07 LAB — MAGNESIUM: Magnesium: 1.7 mg/dL (ref 1.7–2.4)

## 2021-03-08 LAB — MAGNESIUM: Magnesium: 1.7 mg/dL (ref 1.7–2.4)

## 2021-03-09 LAB — URINE CULTURE: Culture: >=100000 — AB

## 2021-03-11 ENCOUNTER — Other Ambulatory Visit (HOSPITAL_COMMUNITY): Payer: Self-pay

## 2021-03-11 LAB — CK TOTAL AND CKMB (NOT AT ARMC)
CK, MB: 5.6 ng/mL — ABNORMAL HIGH (ref 0.5–5.0)
Relative Index: INVALID (ref 0.0–2.5)
Total CK: 22 U/L — ABNORMAL LOW (ref 49–397)

## 2021-03-11 LAB — CBC
HCT: 25.7 % — ABNORMAL LOW (ref 39.0–52.0)
HCT: 23 % — ABNORMAL LOW (ref 39.0–52.0)
Hemoglobin: 7.7 g/dL — ABNORMAL LOW (ref 13.0–17.0)
Hemoglobin: 6.5 g/dL — CL (ref 13.0–17.0)
MCH: 28 pg (ref 26.0–34.0)
MCH: 27.5 pg (ref 26.0–34.0)
MCHC: 30 g/dL (ref 30.0–36.0)
MCHC: 28.3 g/dL — ABNORMAL LOW (ref 30.0–36.0)
MCV: 93.5 fL (ref 80.0–100.0)
MCV: 97.5 fL (ref 80.0–100.0)
Platelets: 348 10*3/uL (ref 150–400)
Platelets: 322 10*3/uL (ref 150–400)
RBC: 2.75 MIL/uL — ABNORMAL LOW (ref 4.22–5.81)
RBC: 2.36 MIL/uL — ABNORMAL LOW (ref 4.22–5.81)
RDW: 19.3 % — ABNORMAL HIGH (ref 11.5–15.5)
RDW: 19.2 % — ABNORMAL HIGH (ref 11.5–15.5)
WBC: 20.8 10*3/uL — ABNORMAL HIGH (ref 4.0–10.5)
WBC: 23.7 10*3/uL — ABNORMAL HIGH (ref 4.0–10.5)
nRBC: 0.3 % — ABNORMAL HIGH (ref 0.0–0.2)
nRBC: 0.5 % — ABNORMAL HIGH (ref 0.0–0.2)

## 2021-03-11 LAB — BLOOD GAS, ARTERIAL
Acid-Base Excess: 3.4 mmol/L — ABNORMAL HIGH (ref 0.0–2.0)
Bicarbonate: 31.2 mmol/L — ABNORMAL HIGH (ref 20.0–28.0)
FIO2: 60
O2 Saturation: 96.2 %
Patient temperature: 37
pCO2 arterial: 86.5 mmHg (ref 32.0–48.0)
pH, Arterial: 7.183 — CL (ref 7.350–7.450)
pO2, Arterial: 90.1 mmHg (ref 83.0–108.0)

## 2021-03-11 LAB — BASIC METABOLIC PANEL
Anion gap: 4 — ABNORMAL LOW (ref 5–15)
Anion gap: 5 (ref 5–15)
BUN: 32 mg/dL — ABNORMAL HIGH (ref 8–23)
BUN: 36 mg/dL — ABNORMAL HIGH (ref 8–23)
CO2: 34 mmol/L — ABNORMAL HIGH (ref 22–32)
CO2: 30 mmol/L (ref 22–32)
Calcium: 10.6 mg/dL — ABNORMAL HIGH (ref 8.9–10.3)
Calcium: 10.3 mg/dL (ref 8.9–10.3)
Chloride: 93 mmol/L — ABNORMAL LOW (ref 98–111)
Chloride: 98 mmol/L (ref 98–111)
Creatinine, Ser: 0.68 mg/dL (ref 0.61–1.24)
Creatinine, Ser: 0.84 mg/dL (ref 0.61–1.24)
GFR, Estimated: >60 mL/min (ref >60)
GFR, Estimated: >60 mL/min (ref >60)
Glucose, Bld: 107 mg/dL — ABNORMAL HIGH (ref 70–99)
Glucose, Bld: 139 mg/dL — ABNORMAL HIGH (ref 70–99)
Potassium: 5.8 mmol/L — ABNORMAL HIGH (ref 3.5–5.1)
Potassium: 5.1 mmol/L (ref 3.5–5.1)
Sodium: 131 mmol/L — ABNORMAL LOW (ref 135–145)
Sodium: 133 mmol/L — ABNORMAL LOW (ref 135–145)

## 2021-03-11 LAB — LACTIC ACID, PLASMA: Lactic Acid, Venous: 3 mmol/L (ref 0.5–1.9)

## 2021-03-11 LAB — MAGNESIUM: Magnesium: 1.4 mg/dL — ABNORMAL LOW (ref 1.7–2.4)

## 2021-03-11 LAB — TROPONIN I (HIGH SENSITIVITY): Troponin I (High Sensitivity): 38 ng/L — ABNORMAL HIGH (ref <18)

## 2021-03-11 LAB — PREPARE RBC (CROSSMATCH)

## 2021-03-12 LAB — BLOOD GAS, ARTERIAL
Acid-Base Excess: 4.4 mmol/L — ABNORMAL HIGH (ref 0.0–2.0)
Bicarbonate: 29.2 mmol/L — ABNORMAL HIGH (ref 20.0–28.0)
Drawn by: 164
FIO2: 50
O2 Saturation: 97.9 %
Patient temperature: 37
pCO2 arterial: 50.2 mmHg — ABNORMAL HIGH (ref 32.0–48.0)
pH, Arterial: 7.382 (ref 7.350–7.450)
pO2, Arterial: 97.8 mmHg (ref 83.0–108.0)

## 2021-03-12 LAB — BASIC METABOLIC PANEL
Anion gap: 7 (ref 5–15)
BUN: 35 mg/dL — ABNORMAL HIGH (ref 8–23)
CO2: 31 mmol/L (ref 22–32)
Calcium: 10.2 mg/dL (ref 8.9–10.3)
Chloride: 94 mmol/L — ABNORMAL LOW (ref 98–111)
Creatinine, Ser: 0.88 mg/dL (ref 0.61–1.24)
GFR, Estimated: >60 mL/min (ref >60)
Glucose, Bld: 104 mg/dL — ABNORMAL HIGH (ref 70–99)
Potassium: 4.7 mmol/L (ref 3.5–5.1)
Sodium: 132 mmol/L — ABNORMAL LOW (ref 135–145)

## 2021-03-12 LAB — CBC
HCT: 23.2 % — ABNORMAL LOW (ref 39.0–52.0)
Hemoglobin: 7.2 g/dL — ABNORMAL LOW (ref 13.0–17.0)
MCH: 28.3 pg (ref 26.0–34.0)
MCHC: 31 g/dL (ref 30.0–36.0)
MCV: 91.3 fL (ref 80.0–100.0)
Platelets: 239 10*3/uL (ref 150–400)
RBC: 2.54 MIL/uL — ABNORMAL LOW (ref 4.22–5.81)
RDW: 18.6 % — ABNORMAL HIGH (ref 11.5–15.5)
WBC: 23.6 10*3/uL — ABNORMAL HIGH (ref 4.0–10.5)
nRBC: 0 % (ref 0.0–0.2)

## 2021-03-12 LAB — MAGNESIUM: Magnesium: 1.9 mg/dL (ref 1.7–2.4)

## 2021-03-12 MED FILL — Medication: Qty: 1 | Status: AC

## 2021-03-13 DIAGNOSIS — J9621 Acute and chronic respiratory failure with hypoxia: Secondary | ICD-10-CM | POA: Diagnosis not present

## 2021-03-13 DIAGNOSIS — Z930 Tracheostomy status: Secondary | ICD-10-CM

## 2021-03-13 DIAGNOSIS — N179 Acute kidney failure, unspecified: Secondary | ICD-10-CM

## 2021-03-13 DIAGNOSIS — J15212 Pneumonia due to Methicillin resistant Staphylococcus aureus: Secondary | ICD-10-CM

## 2021-03-13 DIAGNOSIS — I480 Paroxysmal atrial fibrillation: Secondary | ICD-10-CM

## 2021-03-13 LAB — CBC
HCT: 20.5 % — ABNORMAL LOW (ref 39.0–52.0)
Hemoglobin: 6.3 g/dL — CL (ref 13.0–17.0)
MCH: 27.9 pg (ref 26.0–34.0)
MCHC: 30.7 g/dL (ref 30.0–36.0)
MCV: 90.7 fL (ref 80.0–100.0)
Platelets: 187 10*3/uL (ref 150–400)
RBC: 2.26 MIL/uL — ABNORMAL LOW (ref 4.22–5.81)
RDW: 18.4 % — ABNORMAL HIGH (ref 11.5–15.5)
WBC: 18.4 10*3/uL — ABNORMAL HIGH (ref 4.0–10.5)
nRBC: 0 % (ref 0.0–0.2)

## 2021-03-13 LAB — VANCOMYCIN, TROUGH
Vancomycin Tr: <4 ug/mL — ABNORMAL LOW (ref 15–20)
Vancomycin Tr: 29 ug/mL (ref 15–20)

## 2021-03-13 LAB — BASIC METABOLIC PANEL
Anion gap: 4 — ABNORMAL LOW (ref 5–15)
BUN: 41 mg/dL — ABNORMAL HIGH (ref 8–23)
CO2: 26 mmol/L (ref 22–32)
Calcium: 8.4 mg/dL — ABNORMAL LOW (ref 8.9–10.3)
Chloride: 104 mmol/L (ref 98–111)
Creatinine, Ser: 0.74 mg/dL (ref 0.61–1.24)
GFR, Estimated: >60 mL/min (ref >60)
Glucose, Bld: 101 mg/dL — ABNORMAL HIGH (ref 70–99)
Potassium: 3.8 mmol/L (ref 3.5–5.1)
Sodium: 134 mmol/L — ABNORMAL LOW (ref 135–145)

## 2021-03-13 LAB — CULTURE, RESPIRATORY W GRAM STAIN

## 2021-03-13 LAB — PREPARE RBC (CROSSMATCH)

## 2021-03-13 LAB — OCCULT BLOOD X 1 CARD TO LAB, STOOL: Fecal Occult Bld: NEGATIVE

## 2021-03-13 NOTE — Consult Note (Signed)
Pulmonary Critical Care Medicine [facility] [place]  PULMONARY SERVICE  Date of Service: 03/13/2021  PULMONARY CRITICAL CARE Justin Solomon  WPY:[id number]  DOB: [birth year]   DOA: 02/15/2021  Referring Physician: Carron Curie, MD  HPI: Justin Solomon is a 70 y.o. male seen for follow up of Acute on Chronic Respiratory Failure.  Patient has multiple medical problems including chronic tracheostomy.  He was admitted back in March after a fall sustained left leg weakness as well as facial droop.  Patient had respiratory failure intubated placed on mechanical ventilation subsequently had a tracheostomy done.  Other complications at that time included development of pneumonia.  Patient also did require spinal surgery for an epidural hematoma.  Apparently was discharged to [facility] for rehab however was sent back to the emergency room for acute GI bleed at that time with bright red blood and clots.  Patient had further decompensation and respiratory depression respiratory arrest.  Patient was shocky at the time had an EGD and a flex sig done revealed nonbleeding duodenal ulcers.  Other issues included MRSA pneumonia.  Also patient did develop atrial fibrillation with started on Cardizem drip at the time.  Patient now transferred to our facility for further management and weaning  Review of Systems:  ROS performed and is unremarkable other than noted above.  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 He has never used smokeless tobacco. He reports previous alcohol use. He reports previous drug use.  Family History: Non-Contributory to the present illness  No Known Allergies  Medications: Reviewed on Rounds  Physical Exam:  Vitals: Temperature is 97.9 pulse 96 respiratory 24 blood pressure is 104/60 saturations 98%  Ventilator Settings on the ventilator and assist control mode  General: Comfortable at this time Eyes: Grossly normal lids, irises & conjunctiva ENT: grossly tongue is normal Neck: no obvious mass Cardiovascular: S1-S2 normal no gallop rub Respiratory: Scattered coarse breath sounds noted bilaterally Abdomen: Soft and nontender Skin: no rash seen on limited exam Musculoskeletal: not rigid Psychiatric:unable to assess Neurologic: no seizure no involuntary movements         Labs on Admission:  Basic Metabolic Panel: Recent Labs  Lab 03/07/21 0355 03/08/21 0430 03/11/21 0711 03/11/21 1815 03/12/21 0230 03/13/21 0500  NA  --   --  131* 133* 132* 134*  K  --   --  5.8* 5.1 4.7 3.8  CL  --   --  93* 98 94* 104  CO2  --   --  34* 30 31 26   GLUCOSE  --   --  107* 139* 104* 101*  BUN  --   --  32* 36* 35* 41*  CREATININE  --   --  0.68 0.84 0.88 0.74  CALCIUM  --   --  10.6* 10.3  10.2 8.4*  MG 1.7 1.7 1.4*  --  1.9  --     Recent Labs  Lab 03/11/21 1535 03/12/21 0949  PHART 7.183* 7.382  PCO2ART 86.5* 50.2*  PO2ART 90.1 97.8  HCO3 31.2* 29.2*  O2SAT 96.2 97.9    Liver Function Tests: No results for input(s): AST, ALT, ALKPHOS, BILITOT, PROT, ALBUMIN in the last 168 hours. No results for input(s): LIPASE, AMYLASE in the last 168 hours. No results for input(s): AMMONIA in the last 168 hours.  CBC: Recent Labs  Lab 03/11/21 0711 03/11/21 1815  03/12/21 0540 03/13/21 0500  WBC 20.8* 23.7* 23.6* 18.4*  HGB 7.7* 6.5* 7.2* 6.3*  HCT 25.7* 23.0* 23.2* 20.5*  MCV 93.5 97.5 91.3 90.7  PLT 348 322 239 187    Cardiac Enzymes: Recent Labs  Lab 03/11/21 1815  CKTOTAL 22*  CKMB 5.6*    BNP (last 3 results) No results for input(s): BNP in the last 8760 hours.  ProBNP (last 3 results) No results for input(s): PROBNP in the last 8760 hours.   Radiological Exams on Admission: DG CHEST PORT 1 VIEW  Result Date: 03/11/2021 CLINICAL DATA:  Status post PICC placement EXAM: PORTABLE CHEST 1 VIEW COMPARISON:  Same day chest radiograph FINDINGS: Right upper extremity PICC with tip overlying the SVC. Defibrillator leads overlie the chest/upper abdomen bilaterally. Similar right greater than left pleural effusions and bilateral mid/lower lung zone airspace disease. Stable cardiomegaly. No pneumothorax. Aortic atherosclerosis. IMPRESSION: Right upper extremity PICC with tip overlying the SVC. Similar pleural effusions and airspace opacities. Electronically Signed   By: Maudry Mayhew MD   On: 03/11/2021 17:56   DG CHEST PORT 1 VIEW  Result Date: 03/11/2021 CLINICAL DATA:  Status post CPR. EXAM: PORTABLE CHEST 1 VIEW COMPARISON:  Single-view of the chest 02/20/2021. FINDINGS: Tracheostomy tube remains in place. Bilateral mid and lower lung zone airspace disease is much worse than on the prior examination. There are new pleural effusions, greater on the right. No pneumothorax. Cardiomegaly. Atherosclerosis. IMPRESSION: Worsened right greater than left pleural effusions and bilateral airspace disease which could be due to pneumonia, atelectasis or edema Electronically Signed   By: Drusilla Kanner M.D.   On: 03/11/2021 15:33    Assessment/Plan Active Problems:   Atrial fibrillation (HCC)   ARF (acute renal failure) (HCC)   Acute on chronic respiratory failure with hypoxia (HCC)   Pneumonia due to methicillin resistant Staphylococcus aureus (MRSA)  (HCC)   Tracheostomy status (HCC)   Acute on chronic respiratory failure hypoxia the patient has a baseline tracheostomy however now had to be put back on the ventilator after rapid response.  Chest films revealed worsening of infiltrates and/or airspace disease secondary to probable pneumonia Bacterial pneumonia worsening infiltrates possible aspiration could be considered as an etiology.  Discussed with primary care team regarding antibiotics.  Patient has been on treatment for MRSA Acute renal failure following patient's BUN and creatinine which creatinine is 0.74 BUN slightly elevated Tracheostomy it appears that the tracheostomy was deemed as to be permanent Chronic atrial fibrillation right now rate is controlled plan is going to be to continue with rate control  I have personally seen and evaluated the patient, evaluated laboratory and imaging results, formulated the assessment and plan and placed orders. The Patient requires high complexity decision making with multiple systems involvement.  Case was discussed on Rounds with the Respiratory Therapy Director and the Respiratory staff Time Spent  Yevonne Pax, MD [facility] Pulmonary Critical Care Medicine  Sleep Medicine

## 2021-03-14 ENCOUNTER — Other Ambulatory Visit (HOSPITAL_COMMUNITY): Payer: Self-pay

## 2021-03-14 DIAGNOSIS — I480 Paroxysmal atrial fibrillation: Secondary | ICD-10-CM | POA: Diagnosis not present

## 2021-03-14 DIAGNOSIS — J9621 Acute and chronic respiratory failure with hypoxia: Secondary | ICD-10-CM | POA: Diagnosis not present

## 2021-03-14 DIAGNOSIS — J15212 Pneumonia due to Methicillin resistant Staphylococcus aureus: Secondary | ICD-10-CM | POA: Diagnosis not present

## 2021-03-14 DIAGNOSIS — Z930 Tracheostomy status: Secondary | ICD-10-CM | POA: Diagnosis not present

## 2021-03-14 LAB — CBC
HCT: 25.2 % — ABNORMAL LOW (ref 39.0–52.0)
Hemoglobin: 7.9 g/dL — ABNORMAL LOW (ref 13.0–17.0)
MCH: 27.7 pg (ref 26.0–34.0)
MCHC: 31.3 g/dL (ref 30.0–36.0)
MCV: 88.4 fL (ref 80.0–100.0)
Platelets: 209 10*3/uL (ref 150–400)
RBC: 2.85 MIL/uL — ABNORMAL LOW (ref 4.22–5.81)
RDW: 18.4 % — ABNORMAL HIGH (ref 11.5–15.5)
WBC: 18.6 10*3/uL — ABNORMAL HIGH (ref 4.0–10.5)
nRBC: 0 % (ref 0.0–0.2)

## 2021-03-14 LAB — BLOOD GAS, ARTERIAL
Acid-Base Excess: 1.3 mmol/L (ref 0.0–2.0)
Bicarbonate: 25.8 mmol/L (ref 20.0–28.0)
FIO2: 40
O2 Saturation: 99.5 %
Patient temperature: 37
pCO2 arterial: 43.5 mmHg (ref 32.0–48.0)
pH, Arterial: 7.39 (ref 7.350–7.450)
pO2, Arterial: 187 mmHg — ABNORMAL HIGH (ref 83.0–108.0)

## 2021-03-14 LAB — TYPE AND SCREEN
ABO/RH(D): O POS
Antibody Screen: NEGATIVE
Unit division: 0
Unit division: 0

## 2021-03-14 LAB — BPAM RBC
Blood Product Expiration Date: 202208062359
Blood Product Expiration Date: 202208082359
ISSUE DATE / TIME: 202207082231
ISSUE DATE / TIME: 202207101221
Unit Type and Rh: 5100
Unit Type and Rh: 5100

## 2021-03-14 LAB — VANCOMYCIN, TROUGH: Vancomycin Tr: 18 ug/mL (ref 15–20)

## 2021-03-14 NOTE — Progress Notes (Signed)
Pulmonary Critical Care Medicine [facility] [place]   PULMONARY CRITICAL CARE SERVICE  PROGRESS NOTE     Justin Solomon  [id number]:[id number]  DOB: 1951-07-14   DOA: 02/15/2021  Referring Physician: Carron Curie, MD  HPI: Justin Solomon is a 70 y.o. male being followed for ventilator/airway/oxygen weaning Acute on Chronic Respiratory Failure.  Time patient was on assist control mode full support chest x-ray showing possibility of a pneumonia  Medications: Reviewed on Rounds  Physical Exam:  Vitals: Temperature is 98.0 pulse 95 respiratory rate is 27 blood pressure 139/74 saturations 99%  Ventilator Settings only on assist control FiO2 is 40% tidal volume  General: Comfortable at this time Neck: supple Cardiovascular: no malignant arrhythmias Respiratory: Scattered rhonchi expansion is equal Skin: no rash seen on limited exam Musculoskeletal: No gross abnormality Psychiatric:unable to assess Neurologic:no involuntary movements         Lab Data:   Basic Metabolic Panel: Recent Labs  Lab 03/08/21 0430 03/11/21 0711 03/11/21 1815 03/12/21 0230 03/13/21 0500  NA  --  131* 133* 132* 134*  K  --  5.8* 5.1 4.7 3.8  CL  --  93* 98 94* 104  CO2  --  34* 30 31 26   GLUCOSE  --  107* 139* 104* 101*  BUN  --  32* 36* 35* 41*  CREATININE  --  0.68 0.84 0.88 0.74  CALCIUM  --  10.6* 10.3 10.2 8.4*  MG 1.7 1.4*  --  1.9  --     ABG: Recent Labs  Lab 03/11/21 1535 03/12/21 0949 03/14/21 0933  PHART 7.183* 7.382 7.390  PCO2ART 86.5* 50.2* 43.5  PO2ART 90.1 97.8 187*  HCO3 31.2* 29.2* 25.8  O2SAT 96.2 97.9 99.5    Liver Function Tests: No results for input(s): AST, ALT, ALKPHOS, BILITOT, PROT, ALBUMIN in the last 168 hours. No results for input(s): LIPASE, AMYLASE in the last 168 hours. No results for input(s): AMMONIA in the last 168 hours.  CBC: Recent Labs  Lab 03/11/21 0711 03/11/21 1815 03/12/21 0540 03/13/21 0500 03/14/21 0600  WBC 20.8* 23.7*  23.6* 18.4* 18.6*  HGB 7.7* 6.5* 7.2* 6.3* 7.9*  HCT 25.7* 23.0* 23.2* 20.5* 25.2*  MCV 93.5 97.5 91.3 90.7 88.4  PLT 348 322 239 187 209    Cardiac Enzymes: Recent Labs  Lab 03/11/21 1815  CKTOTAL 22*  CKMB 5.6*    BNP (last 3 results) No results for input(s): BNP in the last 8760 hours.  ProBNP (last 3 results) No results for input(s): PROBNP in the last 8760 hours.  Radiological Exams: DG CHEST PORT 1 VIEW  Result Date: 03/14/2021 CLINICAL DATA:  Respiratory failure EXAM: PORTABLE CHEST 1 VIEW COMPARISON:  03/11/2021 FINDINGS: Tracheostomy and right PICC line remain in place, unchanged. Heart is normal size. Bilateral airspace disease, most pronounced in the lower lobes. Suspect small bilateral effusions. No real change since prior study. IMPRESSION: Bilateral perihilar and lower lobe airspace opacities with small effusions. Findings could reflect edema or infection. No real change. Electronically Signed   By: 05/12/2021 M.D.   On: 03/14/2021 10:20    Assessment/Plan Active Problems:   Atrial fibrillation (HCC)   ARF (acute renal failure) (HCC)   Acute on chronic respiratory failure with hypoxia (HCC)   Pneumonia due to methicillin resistant Staphylococcus aureus (MRSA) (HCC)   Tracheostomy status (HCC)   Acute on chronic respiratory failure with hypoxia patient currently is on assist control full support on 40% FiO2.  The patient  is doing little bit better in terms of ventilation is concerned.  Respiratory therapy will assess the RSB I tried advance weaning. Bilateral pneumonia chest x-ray was found showing bilateral infiltrates lower lobes being treated right now likely was because of declining respiratory status Acute renal failure following the labs closely we will continue with fluid management management Tracheostomy will remain in place at this time Chronic atrial fibrillation) rate is controlled   I have personally seen and evaluated the patient, evaluated  laboratory and imaging results, formulated the assessment and plan and placed orders. The Patient requires high complexity decision making with multiple systems involvement.  Rounds were done with the Respiratory Therapy Director and Staff therapists and discussed with nursing staff also.  Yevonne Pax, MD [facility] Pulmonary Critical Care Medicine Sleep Medicine

## 2021-03-15 DIAGNOSIS — I480 Paroxysmal atrial fibrillation: Secondary | ICD-10-CM | POA: Diagnosis not present

## 2021-03-15 DIAGNOSIS — Z930 Tracheostomy status: Secondary | ICD-10-CM | POA: Diagnosis not present

## 2021-03-15 DIAGNOSIS — J15212 Pneumonia due to Methicillin resistant Staphylococcus aureus: Secondary | ICD-10-CM | POA: Diagnosis not present

## 2021-03-15 DIAGNOSIS — J9621 Acute and chronic respiratory failure with hypoxia: Secondary | ICD-10-CM | POA: Diagnosis not present

## 2021-03-15 NOTE — Progress Notes (Signed)
Pulmonary Critical Care Medicine [facility] [place]   PULMONARY CRITICAL CARE SERVICE  PROGRESS NOTE     Justin Solomon  ZOX:[id number]  DOB: 1951-05-20   DOA: 02/15/2021  Referring Physician: Carron Curie, MD  HPI: Justin Solomon is a 70 y.o. male being followed for ventilator/airway/oxygen weaning Acute on Chronic Respiratory Failure.  At this time patient is comfortable without distress remains on the ventilator assist-control mode was on high respiratory rate asked respiratory therapy to decrease the rate  Medications: Reviewed on Rounds  Physical Exam:  Vitals: Temperature 99.8 pulse 89 respiratory rate is 22 blood pressure is 165/59 saturations 98%  Ventilator Settings on assist control FiO2 40%  General: Comfortable at this time Neck: supple Cardiovascular: no malignant arrhythmias Respiratory: No rhonchi very coarse breath sounds Skin: no rash seen on limited exam Musculoskeletal: No gross abnormality Psychiatric:unable to assess Neurologic:no involuntary movements         Lab Data:   Basic Metabolic Panel: Recent Labs  Lab 03/11/21 0711 03/11/21 1815 03/12/21 0230 03/13/21 0500  NA 131* 133* 132* 134*  K 5.8* 5.1 4.7 3.8  CL 93* 98 94* 104  CO2 34* 30 31 26   GLUCOSE 107* 139* 104* 101*  BUN 32* 36* 35* 41*  CREATININE 0.68 0.84 0.88 0.74  CALCIUM 10.6* 10.3 10.2 8.4*  MG 1.4*  --  1.9  --     ABG: Recent Labs  Lab 03/11/21 1535 03/12/21 0949 03/14/21 0933  PHART 7.183* 7.382 7.390  PCO2ART 86.5* 50.2* 43.5  PO2ART 90.1 97.8 187*  HCO3 31.2* 29.2* 25.8  O2SAT 96.2 97.9 99.5    Liver Function Tests: No results for input(s): AST, ALT, ALKPHOS, BILITOT, PROT, ALBUMIN in the last 168 hours. No results for input(s): LIPASE, AMYLASE in the last 168 hours. No results for input(s): AMMONIA in the last 168 hours.  CBC: Recent Labs  Lab 03/11/21 0711 03/11/21 1815 03/12/21 0540 03/13/21 0500 03/14/21 0600  WBC 20.8* 23.7* 23.6*  18.4* 18.6*  HGB 7.7* 6.5* 7.2* 6.3* 7.9*  HCT 25.7* 23.0* 23.2* 20.5* 25.2*  MCV 93.5 97.5 91.3 90.7 88.4  PLT 348 322 239 187 209    Cardiac Enzymes: Recent Labs  Lab 03/11/21 1815  CKTOTAL 22*  CKMB 5.6*    BNP (last 3 results) No results for input(s): BNP in the last 8760 hours.  ProBNP (last 3 results) No results for input(s): PROBNP in the last 8760 hours.  Radiological Exams: DG CHEST PORT 1 VIEW  Result Date: 03/14/2021 CLINICAL DATA:  Respiratory failure EXAM: PORTABLE CHEST 1 VIEW COMPARISON:  03/11/2021 FINDINGS: Tracheostomy and right PICC line remain in place, unchanged. Heart is normal size. Bilateral airspace disease, most pronounced in the lower lobes. Suspect small bilateral effusions. No real change since prior study. IMPRESSION: Bilateral perihilar and lower lobe airspace opacities with small effusions. Findings could reflect edema or infection. No real change. Electronically Signed   By: 05/12/2021 M.D.   On: 03/14/2021 10:20    Assessment/Plan Active Problems:   Atrial fibrillation (HCC)   ARF (acute renal failure) (HCC)   Acute on chronic respiratory failure with hypoxia (HCC)   Pneumonia due to methicillin resistant Staphylococcus aureus (MRSA) (HCC)   Tracheostomy status (HCC)   Acute on chronic respiratory failure hypoxia plan is going to be to decrease the respiratory rate and try to start weaning.  Hopefully we can quickly wean the patient back down to baseline T collar Acute renal failure we will continue  with supportive care monitoring lab Atrial fibrillation right now rate is controlled Pneumonia due to MRSA has been treated with antibiotics Tracheostomy remains in place   I have personally seen and evaluated the patient, evaluated laboratory and imaging results, formulated the assessment and plan and placed orders. The Patient requires high complexity decision making with multiple systems involvement.  Rounds were done with the Respiratory  Therapy Director and Staff therapists and discussed with nursing staff also.  Yevonne Pax, MD [facility] Pulmonary Critical Care Medicine Sleep Medicine

## 2021-03-16 DIAGNOSIS — I480 Paroxysmal atrial fibrillation: Secondary | ICD-10-CM | POA: Diagnosis not present

## 2021-03-16 DIAGNOSIS — Z930 Tracheostomy status: Secondary | ICD-10-CM

## 2021-03-16 DIAGNOSIS — J15212 Pneumonia due to Methicillin resistant Staphylococcus aureus: Secondary | ICD-10-CM | POA: Diagnosis not present

## 2021-03-16 DIAGNOSIS — J9621 Acute and chronic respiratory failure with hypoxia: Secondary | ICD-10-CM | POA: Diagnosis not present

## 2021-03-16 LAB — CBC
HCT: 25.2 % — ABNORMAL LOW (ref 39.0–52.0)
Hemoglobin: 7.9 g/dL — ABNORMAL LOW (ref 13.0–17.0)
MCH: 28.2 pg (ref 26.0–34.0)
MCHC: 31.3 g/dL (ref 30.0–36.0)
MCV: 90 fL (ref 80.0–100.0)
Platelets: 216 10*3/uL (ref 150–400)
RBC: 2.8 MIL/uL — ABNORMAL LOW (ref 4.22–5.81)
RDW: 18 % — ABNORMAL HIGH (ref 11.5–15.5)
WBC: 21.4 10*3/uL — ABNORMAL HIGH (ref 4.0–10.5)
nRBC: 0 % (ref 0.0–0.2)

## 2021-03-16 LAB — BASIC METABOLIC PANEL
Anion gap: 7 (ref 5–15)
BUN: 57 mg/dL — ABNORMAL HIGH (ref 8–23)
CO2: 26 mmol/L (ref 22–32)
Calcium: 9.7 mg/dL (ref 8.9–10.3)
Chloride: 101 mmol/L (ref 98–111)
Creatinine, Ser: 0.84 mg/dL (ref 0.61–1.24)
GFR, Estimated: >60 mL/min (ref >60)
Glucose, Bld: 105 mg/dL — ABNORMAL HIGH (ref 70–99)
Potassium: 4.1 mmol/L (ref 3.5–5.1)
Sodium: 134 mmol/L — ABNORMAL LOW (ref 135–145)

## 2021-03-16 NOTE — Progress Notes (Signed)
Pulmonary Critical Care Medicine [facility] [place]   PULMONARY CRITICAL CARE SERVICE  PROGRESS NOTE     Justin Solomon  [id number]:[id number]  DOB: 1951/01/17   DOA: 02/15/2021  Referring Physician: Carron Curie, MD  HPI: Justin Solomon is a 70 y.o. male being followed for ventilator/airway/oxygen weaning Acute on Chronic Respiratory Failure.  Patient is comfortable right now without distress has been on a pressure support of 12/5  Medications: Reviewed on Rounds  Physical Exam:  Vitals: Temperature is 97.7 pulse 88 respiratory 30 blood pressure is 132/68 saturations 98%  Ventilator Settings on pressure support FiO2 is 40% pressure 12/5  General: Comfortable at this time Neck: supple Cardiovascular: no malignant arrhythmias Respiratory: No rhonchi very coarse breath sounds Skin: no rash seen on limited exam Musculoskeletal: No gross abnormality Psychiatric:unable to assess Neurologic:no involuntary movements         Lab Data:   Basic Metabolic Panel: Recent Labs  Lab 03/11/21 0711 03/11/21 1815 03/12/21 0230 03/13/21 0500 03/16/21 0552  NA 131* 133* 132* 134* 134*  K 5.8* 5.1 4.7 3.8 4.1  CL 93* 98 94* 104 101  CO2 34* 30 31 26 26   GLUCOSE 107* 139* 104* 101* 105*  BUN 32* 36* 35* 41* 57*  CREATININE 0.68 0.84 0.88 0.74 0.84  CALCIUM 10.6* 10.3 10.2 8.4* 9.7  MG 1.4*  --  1.9  --   --     ABG: Recent Labs  Lab 03/11/21 1535 03/12/21 0949 03/14/21 0933  PHART 7.183* 7.382 7.390  PCO2ART 86.5* 50.2* 43.5  PO2ART 90.1 97.8 187*  HCO3 31.2* 29.2* 25.8  O2SAT 96.2 97.9 99.5    Liver Function Tests: No results for input(s): AST, ALT, ALKPHOS, BILITOT, PROT, ALBUMIN in the last 168 hours. No results for input(s): LIPASE, AMYLASE in the last 168 hours. No results for input(s): AMMONIA in the last 168 hours.  CBC: Recent Labs  Lab 03/11/21 1815 03/12/21 0540 03/13/21 0500 03/14/21 0600 03/16/21 0552  WBC 23.7* 23.6* 18.4* 18.6* 21.4*  HGB  6.5* 7.2* 6.3* 7.9* 7.9*  HCT 23.0* 23.2* 20.5* 25.2* 25.2*  MCV 97.5 91.3 90.7 88.4 90.0  PLT 322 239 187 209 216    Cardiac Enzymes: Recent Labs  Lab 03/11/21 1815  CKTOTAL 22*  CKMB 5.6*    BNP (last 3 results) No results for input(s): BNP in the last 8760 hours.  ProBNP (last 3 results) No results for input(s): PROBNP in the last 8760 hours.  Radiological Exams: No results found.  Assessment/Plan Active Problems:   Atrial fibrillation (HCC)   ARF (acute renal failure) (HCC)   Acute on chronic respiratory failure with hypoxia (HCC)   Pneumonia due to methicillin resistant Staphylococcus aureus (MRSA) (HCC)   Tracheostomy status (HCC)   Acute on chronic respiratory failure hypoxia plan is to continue with the pressure support with 40% FiO2 goal of 4 hours Acute renal failure labs are at baseline we will continue to monitor closely BUN still slightly higher Atrial fibrillation rate controlled at this time MRSA pneumonia treated with antibiotics continue to monitor Tracheostomy remains in place   I have personally seen and evaluated the patient, evaluated laboratory and imaging results, formulated the assessment and plan and placed orders. The Patient requires high complexity decision making with multiple systems involvement.  Rounds were done with the Respiratory Therapy Director and Staff therapists and discussed with nursing staff also.  05/12/21, MD [facility] Pulmonary Critical Care Medicine Sleep Medicine

## 2021-03-17 DIAGNOSIS — J9621 Acute and chronic respiratory failure with hypoxia: Secondary | ICD-10-CM | POA: Diagnosis not present

## 2021-03-17 DIAGNOSIS — I480 Paroxysmal atrial fibrillation: Secondary | ICD-10-CM | POA: Diagnosis not present

## 2021-03-17 DIAGNOSIS — J15212 Pneumonia due to Methicillin resistant Staphylococcus aureus: Secondary | ICD-10-CM | POA: Diagnosis not present

## 2021-03-17 DIAGNOSIS — Z930 Tracheostomy status: Secondary | ICD-10-CM | POA: Diagnosis not present

## 2021-03-17 NOTE — Progress Notes (Signed)
Pulmonary Critical Care [facility] [place]   PULMONARY CRITICAL CARE SERVICE  PROGRESS NOTE     Justin Solomon  WCH:[id number]  DOB: 11-Feb-1951   DOA: 02/15/2021  Referring Physician: Carron Curie, MD  HPI: Justin Solomon is a 70 y.o. male being followed for ventilator/airway/oxygen weaning Acute on Chronic Respiratory Failure.  Patient continues to wean on T collar the goal is for 8 hours FiO2 is at 40%  Medications: Reviewed on Rounds  Physical Exam:  Vitals: Temperature is 97.8 pulse 83 respiratory 20 blood pressure is 110/63 saturations 100%  Ventilator Settings on T collar FiO2 40%  General: Comfortable at this time Neck: supple Cardiovascular: no malignant arrhythmias Respiratory: No rhonchi no rales are noted at this time Skin: no rash seen on limited exam Musculoskeletal: No gross abnormality Psychiatric:unable to assess Neurologic:no involuntary movements         Lab Data:   Basic Metabolic Panel: Recent Labs  Lab 03/11/21 0711 03/11/21 1815 03/12/21 0230 03/13/21 0500 03/16/21 0552  NA 131* 133* 132* 134* 134*  K 5.8* 5.1 4.7 3.8 4.1  CL 93* 98 94* 104 101  CO2 34* 30 31 26 26   GLUCOSE 107* 139* 104* 101* 105*  BUN 32* 36* 35* 41* 57*  CREATININE 0.68 0.84 0.88 0.74 0.84  CALCIUM 10.6* 10.3 10.2 8.4* 9.7  MG 1.4*  --  1.9  --   --     ABG: Recent Labs  Lab 03/11/21 1535 03/12/21 0949 03/14/21 0933  PHART 7.183* 7.382 7.390  PCO2ART 86.5* 50.2* 43.5  PO2ART 90.1 97.8 187*  HCO3 31.2* 29.2* 25.8  O2SAT 96.2 97.9 99.5    Liver Function Tests: No results for input(s): AST, ALT, ALKPHOS, BILITOT, PROT, ALBUMIN in the last 168 hours. No results for input(s): LIPASE, AMYLASE in the last 168 hours. No results for input(s): AMMONIA in the last 168 hours.  CBC: Recent Labs  Lab 03/11/21 1815 03/12/21 0540 03/13/21 0500 03/14/21 0600 03/16/21 0552  WBC 23.7* 23.6* 18.4* 18.6* 21.4*  HGB 6.5* 7.2* 6.3* 7.9* 7.9*  HCT  23.0* 23.2* 20.5* 25.2* 25.2*  MCV 97.5 91.3 90.7 88.4 90.0  PLT 322 239 187 209 216    Cardiac Enzymes: Recent Labs  Lab 03/11/21 1815  CKTOTAL 22*  CKMB 5.6*    BNP (last 3 results) No results for input(s): BNP in the last 8760 hours.  ProBNP (last 3 results) No results for input(s): PROBNP in the last 8760 hours.  Radiological Exams: No results found.  Assessment/Plan Active Problems:   Atrial fibrillation (HCC)   ARF (acute renal failure) (HCC)   Acute on chronic respiratory failure with hypoxia (HCC)   Pneumonia due to methicillin resistant Staphylococcus aureus (MRSA) (HCC)   Tracheostomy status (HCC)   Acute on chronic respiratory failure hypoxia on T collar 40% goal of 8 hours today. Atrial fibrillation right now rate controlled we will continue to monitor closely. Acute renal failure following up on labs closely Tracheostomy right now remains in place Pneumonia due to MRSA has been treated improving clinically   I have personally seen and evaluated the patient, evaluated laboratory and imaging results, formulated the assessment and plan and placed orders. The Patient requires high complexity decision making with multiple systems involvement.  Rounds were done with the Respiratory Therapy Director and Staff therapists and discussed with nursing staff also.  05/12/21, MD [facility] Pulmonary Critical Care Medicine Sleep Medicine

## 2021-03-18 LAB — VANCOMYCIN, TROUGH: Vancomycin Tr: 32 ug/mL (ref 15–20)

## 2021-03-19 ENCOUNTER — Other Ambulatory Visit: Payer: Self-pay

## 2021-03-19 ENCOUNTER — Encounter (HOSPITAL_COMMUNITY): Payer: Self-pay | Admitting: Emergency Medicine

## 2021-03-19 ENCOUNTER — Emergency Department (HOSPITAL_COMMUNITY): Payer: Medicare Other

## 2021-03-19 ENCOUNTER — Emergency Department (HOSPITAL_COMMUNITY)
Admission: EM | Admit: 2021-03-19 | Discharge: 2021-03-20 | Disposition: A | Discharge status: Home / Self Care | Payer: Medicare Other | Attending: Emergency Medicine | Admitting: Emergency Medicine

## 2021-03-19 DIAGNOSIS — R002 Palpitations: Secondary | ICD-10-CM | POA: Diagnosis present

## 2021-03-19 DIAGNOSIS — I10 Essential (primary) hypertension: Secondary | ICD-10-CM | POA: Diagnosis not present

## 2021-03-19 DIAGNOSIS — Z7982 Long term (current) use of aspirin: Secondary | ICD-10-CM | POA: Diagnosis not present

## 2021-03-19 DIAGNOSIS — I4891 Unspecified atrial fibrillation: Secondary | ICD-10-CM | POA: Insufficient documentation

## 2021-03-19 DIAGNOSIS — Z794 Long term (current) use of insulin: Secondary | ICD-10-CM | POA: Insufficient documentation

## 2021-03-19 DIAGNOSIS — Z8673 Personal history of transient ischemic attack (TIA), and cerebral infarction without residual deficits: Secondary | ICD-10-CM | POA: Insufficient documentation

## 2021-03-19 DIAGNOSIS — Z79899 Other long term (current) drug therapy: Secondary | ICD-10-CM | POA: Diagnosis not present

## 2021-03-19 LAB — CBC WITH DIFFERENTIAL/PLATELET
Abs Immature Granulocytes: 0.18 10*3/uL — ABNORMAL HIGH (ref 0.00–0.07)
Basophils Absolute: 0 10*3/uL (ref 0.0–0.1)
Basophils Relative: 0 %
Eosinophils Absolute: 0.1 10*3/uL (ref 0.0–0.5)
Eosinophils Relative: 1 %
HCT: 25.3 % — ABNORMAL LOW (ref 39.0–52.0)
Hemoglobin: 7.4 g/dL — ABNORMAL LOW (ref 13.0–17.0)
Immature Granulocytes: 1 %
Lymphocytes Relative: 9 %
Lymphs Abs: 1.5 10*3/uL (ref 0.7–4.0)
MCH: 27.9 pg (ref 26.0–34.0)
MCHC: 29.2 g/dL — ABNORMAL LOW (ref 30.0–36.0)
MCV: 95.5 fL (ref 80.0–100.0)
Monocytes Absolute: 1.2 10*3/uL — ABNORMAL HIGH (ref 0.1–1.0)
Monocytes Relative: 7 %
Neutro Abs: 14.6 10*3/uL — ABNORMAL HIGH (ref 1.7–7.7)
Neutrophils Relative %: 82 %
Platelets: 316 10*3/uL (ref 150–400)
RBC: 2.65 MIL/uL — ABNORMAL LOW (ref 4.22–5.81)
RDW: 17.3 % — ABNORMAL HIGH (ref 11.5–15.5)
WBC: 17.7 10*3/uL — ABNORMAL HIGH (ref 4.0–10.5)
nRBC: 0 % (ref 0.0–0.2)

## 2021-03-19 LAB — COMPREHENSIVE METABOLIC PANEL
ALT: 49 U/L — ABNORMAL HIGH (ref 0–44)
AST: 28 U/L (ref 15–41)
Albumin: 1.4 g/dL — ABNORMAL LOW (ref 3.5–5.0)
Alkaline Phosphatase: 87 U/L (ref 38–126)
Anion gap: 3 — ABNORMAL LOW (ref 5–15)
BUN: 54 mg/dL — ABNORMAL HIGH (ref 8–23)
CO2: 30 mmol/L (ref 22–32)
Calcium: 9.9 mg/dL (ref 8.9–10.3)
Chloride: 106 mmol/L (ref 98–111)
Creatinine, Ser: 0.8 mg/dL (ref 0.61–1.24)
GFR, Estimated: >60 mL/min (ref >60)
Glucose, Bld: 99 mg/dL (ref 70–99)
Potassium: 4.5 mmol/L (ref 3.5–5.1)
Sodium: 139 mmol/L (ref 135–145)
Total Bilirubin: 0.7 mg/dL (ref 0.3–1.2)
Total Protein: 7.6 g/dL (ref 6.5–8.1)

## 2021-03-19 NOTE — ED Provider Notes (Signed)
[place] EMERGENCY DEPARTMENT Provider Note  CSN: [id number] Arrival date & time: 03/19/21 1922    History Chief Complaint  Patient presents with   Tachycardia    Justin Solomon is a 70 y.o. 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 That admission was complicated by respiratory faiure, sepsis, rapid afib, and multifocal acute/subacute infarcts. Eventually he was stablized, transferred to [facility] and the discharged back to [facility] yesterday. Per EMS report, the patient began having rapid heart rate earlier today at [facility]. He was given a dose of metoprolol but daughter requested EMS be called. On arrival here his HR is well controlled. Level 5 caveat applies due to trach, nonverbal patient.    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)    Quadraplegic    Past Surgical History:  Procedure Laterality Date   ESOPHAGOGASTRODUODENOSCOPY N/A 02/02/2021   Procedure: ESOPHAGOGASTRODUODENOSCOPY (EGD);  Surgeon: Kathi Der, MD;  Location: [facility];  Service: Gastroenterology;  Laterality: N/A;   FLEXIBLE SIGMOIDOSCOPY N/A 02/02/2021   Procedure: FLEXIBLE SIGMOIDOSCOPY;  Surgeon: Kathi Der, MD;  Location: [facility] ENDOSCOPY;  Service: Gastroenterology;  Laterality: N/A;   PEG TUBE PLACEMENT     TRACHEOSTOMY      Family History  Problem Relation Age of Onset   Diabetes Mellitus II Neg Hx     Social History   Tobacco Use   Smoking status: Former   Smokeless tobacco: Never  Substance Use Topics   Alcohol use: Not Currently   Drug use: Not Currently     Home Medications Prior to Admission medications   Medication Sig Start Date End Date Taking? Authorizing Provider   acetaminophen (TYLENOL) 325 MG tablet Place 2 tablets (650 mg total) into feeding tube every 6 (six) hours as needed for mild pain (or Fever >/= 101). 02/15/21   Eliezer Champagne, NP  amiodarone (PACERONE) 200 MG tablet Place 200 mg into feeding tube daily. 12/30/20   [provider]  aspirin 81 MG chewable tablet Place 1 tablet (81 mg total) into feeding tube daily. 02/16/21   Eliezer Champagne, NP  chlorhexidine (PERIDEX) 0.12 % solution 15 mLs by Mouth Rinse route 2 (two) times daily. 02/15/21   Eliezer Champagne, NP  Chlorhexidine Gluconate Cloth 2 % PADS Apply 6 each topically daily. 02/16/21   Eliezer Champagne, NP  docusate (COLACE) 50 MG/5ML liquid Place 10 mLs (100 mg total) into feeding tube 2 (two) times daily. 02/15/21   Eliezer Champagne, NP  insulin aspart (NOVOLOG) 100 UNIT/ML injection Inject 0-9 Units into the skin every 4 (four) hours. 02/15/21   Eliezer Champagne, NP  Mouthwashes (MOUTH RINSE) LIQD solution 15 mLs by Mouth Rinse route 2 times daily at 12 noon and 4 pm. 02/15/21   Eliezer Champagne, NP  Nutritional Supplements (FEEDING SUPPLEMENT, OSMOLITE 1.5 CAL,) LIQD Place 1,000 mLs into feeding tube continuous. 02/15/21   Eliezer Champagne, NP  Nutritional Supplements (FEEDING SUPPLEMENT, PROSOURCE TF,) liquid Place 45 mLs into feeding tube 2 (two) times daily. 02/15/21   Eliezer Champagne, NP  pantoprazole sodium (PROTONIX) 40 mg/20 mL PACK Place 20 mLs (40 mg total) into feeding tube 2 (two) times daily. 02/15/21  Eliezer Champagne, NP  polyethylene glycol (MIRALAX / GLYCOLAX) 17 g packet Place 17 g into feeding tube daily as needed for mild constipation. 02/15/21   Eliezer Champagne, NP  Water For Irrigation, Sterile (FREE WATER) SOLN Place 400 mLs into feeding tube every 4 (four) hours. 02/15/21   Eliezer Champagne, NP     Allergies    Patient has no known allergies.   Review of Systems   Review of Systems Unable to assess due to mental status.    Physical Exam BP  112/68   Pulse 94   Resp (!) 21   Ht 6' (1.829 m)   Wt 99.8 kg   SpO2 96%   BMI 29.84 kg/m   Physical Exam Vitals and nursing note reviewed.  Constitutional:      General: He is not in acute distress.    Appearance: He is obese.  HENT:     Head: Normocephalic and atraumatic.     Nose: Nose normal.     Mouth/Throat:     Mouth: Mucous membranes are moist.  Eyes:     Extraocular Movements: Extraocular movements intact.     Conjunctiva/sclera: Conjunctivae normal.  Neck:     Comments: tracheostomy Cardiovascular:     Rate and Rhythm: Normal rate and regular rhythm.  Pulmonary:     Effort: Pulmonary effort is normal.     Breath sounds: Rhonchi present.  Abdominal:     General: Abdomen is flat.     Palpations: Abdomen is soft.     Tenderness: There is no abdominal tenderness.  Musculoskeletal:        General: No swelling. Normal range of motion.     Cervical back: Neck supple.     Right lower leg: No edema.     Left lower leg: No edema.  Skin:    General: Skin is warm and dry.  Neurological:     Mental Status: Mental status is at baseline.  Psychiatric:     Comments: Able to assess     ED Results / Procedures / Treatments   Labs (all labs ordered are listed, but only abnormal results are displayed) Labs Reviewed  COMPREHENSIVE METABOLIC PANEL - Abnormal; Notable for the following components:      Result Value   BUN 54 (*)    Albumin 1.4 (*)    ALT 49 (*)    Anion gap 3 (*)    All other components within normal limits  CBC WITH DIFFERENTIAL/PLATELET - Abnormal; Notable for the following components:   WBC 17.7 (*)    RBC 2.65 (*)    Hemoglobin 7.4 (*)    HCT 25.3 (*)    MCHC 29.2 (*)    RDW 17.3 (*)    Neutro Abs 14.6 (*)    Monocytes Absolute 1.2 (*)    Abs Immature Granulocytes 0.18 (*)    All other components within normal limits    EKG EKG Interpretation  Date/Time:  Saturday March 19 2021 19:29:12 EDT Ventricular Rate:  103 PR  Interval:  177 QRS Duration: 99 QT Interval:  308 QTC Calculation: 404 R Axis:   -9 Text Interpretation: Sinus tachycardia Borderline low voltage, extremity leads Since last tracing P-waves now present Rate faster Confirmed by Susy Frizzle [phone]) on 03/19/2021 7:33:19 PM  Radiology DG Chest Port 1 View  Result Date: 03/19/2021 CLINICAL DATA:  Short of breath, recent pneumonia, tracheostomy EXAM: PORTABLE CHEST 1 VIEW COMPARISON:  03/14/2021 FINDINGS: Single frontal view of the  chest demonstrates stable tracheostomy tube and right-sided PICC. Cardiac silhouette is stable. Persistent bibasilar consolidation and small left pleural effusion unchanged since prior exam. No pneumothorax. No acute bony abnormalities. IMPRESSION: 1. Persistent bibasilar consolidation and left pleural effusion, compatible with given history of pneumonia. Electronically Signed   By: Sharlet Salina M.D.   On: 03/19/2021 20:48    Procedures Procedures  Medications Ordered in the ED Medications - No data to display   MDM Rules/Calculators/A&P MDM Patient with afib, recently discharged to [facility], given metoprolol prior to arrival with normal HR in sinus rhythm here. He otherwise appears to be at recent baseline.   ED Course  I have reviewed the triage vital signs and the nursing notes.  Pertinent labs & imaging results that were available during my care of the patient were reviewed by me and considered in my medical decision making (see chart for details).  Clinical Course as of 03/19/21 2235  Sat Mar 19, 2021  2018 Patient's sittter/caregiver who know him well is at bedside. States he had some elevated in his HR at [facility] after she had gone downstairs to get something to eat. She states he seems to be comfortably now, but is more somnolent than usual. Family is enroute.  [CS]  2129 CBC shows WBC is improved, Hgb is at baseline [CS]  2138 CMP is normal.  [CS]  2228 Patient remains well appearing. No distress  and labs are at baseline. Spoke with patient's daughter and son over the phone, reassured that he does not appear to have had an acute change from recent admission. Will d/c back to [facility].  [CS]    Clinical Course User Index [CS] Pollyann Savoy, MD    Final Clinical Impression(s) / ED Diagnoses Final diagnoses:  Atrial fibrillation with [facility])    Rx / DC Orders ED Discharge Orders     None        Pollyann Savoy, MD 03/19/21 2235

## 2021-03-19 NOTE — ED Notes (Signed)
Called report to [employer] [phone]

## 2021-03-19 NOTE — ED Notes (Signed)
Ptar called by Demarrius Guerrero he is next up

## 2021-03-19 NOTE — ED Triage Notes (Signed)
Pt from [facility].  Discharged from [facility] here yesterday  Trach patient. Hx of afib.  Pt reported heart rate at facility was 130s to 140.  Given metoprolol.  Pt HR at time of triage around 100.

## 2021-04-16 ENCOUNTER — Emergency Department (HOSPITAL_COMMUNITY)
Admission: EM | Admit: 2021-04-16 | Discharge: 2021-04-17 | Disposition: A | Discharge status: Home / Self Care | Payer: Medicare Other | Attending: Emergency Medicine | Admitting: Emergency Medicine

## 2021-04-16 ENCOUNTER — Emergency Department (HOSPITAL_COMMUNITY): Payer: Medicare Other

## 2021-04-16 ENCOUNTER — Other Ambulatory Visit: Payer: Self-pay

## 2021-04-16 DIAGNOSIS — R918 Other nonspecific abnormal finding of lung field: Secondary | ICD-10-CM | POA: Diagnosis not present

## 2021-04-16 DIAGNOSIS — J181 Lobar pneumonia, unspecified organism: Secondary | ICD-10-CM | POA: Diagnosis not present

## 2021-04-16 DIAGNOSIS — Z79899 Other long term (current) drug therapy: Secondary | ICD-10-CM | POA: Diagnosis not present

## 2021-04-16 DIAGNOSIS — Z794 Long term (current) use of insulin: Secondary | ICD-10-CM | POA: Insufficient documentation

## 2021-04-16 DIAGNOSIS — J189 Pneumonia, unspecified organism: Secondary | ICD-10-CM

## 2021-04-16 DIAGNOSIS — Z7982 Long term (current) use of aspirin: Secondary | ICD-10-CM | POA: Insufficient documentation

## 2021-04-16 DIAGNOSIS — R9389 Abnormal findings on diagnostic imaging of other specified body structures: Secondary | ICD-10-CM

## 2021-04-16 DIAGNOSIS — I10 Essential (primary) hypertension: Secondary | ICD-10-CM | POA: Insufficient documentation

## 2021-04-16 DIAGNOSIS — R0902 Hypoxemia: Secondary | ICD-10-CM | POA: Diagnosis present

## 2021-04-16 LAB — CBC WITH DIFFERENTIAL/PLATELET
Abs Immature Granulocytes: 0.08 10*3/uL — ABNORMAL HIGH (ref 0.00–0.07)
Basophils Absolute: 0 10*3/uL (ref 0.0–0.1)
Basophils Relative: 0 %
Eosinophils Absolute: 0.1 10*3/uL (ref 0.0–0.5)
Eosinophils Relative: 1 %
HCT: 23.7 % — ABNORMAL LOW (ref 39.0–52.0)
Hemoglobin: 7.2 g/dL — ABNORMAL LOW (ref 13.0–17.0)
Immature Granulocytes: 1 %
Lymphocytes Relative: 12 %
Lymphs Abs: 1.6 10*3/uL (ref 0.7–4.0)
MCH: 28.2 pg (ref 26.0–34.0)
MCHC: 30.4 g/dL (ref 30.0–36.0)
MCV: 92.9 fL (ref 80.0–100.0)
Monocytes Absolute: 0.8 10*3/uL (ref 0.1–1.0)
Monocytes Relative: 6 %
Neutro Abs: 11.3 10*3/uL — ABNORMAL HIGH (ref 1.7–7.7)
Neutrophils Relative %: 80 %
Platelets: 249 10*3/uL (ref 150–400)
RBC: 2.55 MIL/uL — ABNORMAL LOW (ref 4.22–5.81)
RDW: 16.2 % — ABNORMAL HIGH (ref 11.5–15.5)
WBC: 13.9 10*3/uL — ABNORMAL HIGH (ref 4.0–10.5)
nRBC: 0 % (ref 0.0–0.2)

## 2021-04-16 LAB — BASIC METABOLIC PANEL
Anion gap: 6 (ref 5–15)
BUN: 42 mg/dL — ABNORMAL HIGH (ref 8–23)
CO2: 28 mmol/L (ref 22–32)
Calcium: 10.4 mg/dL — ABNORMAL HIGH (ref 8.9–10.3)
Chloride: 103 mmol/L (ref 98–111)
Creatinine, Ser: 0.78 mg/dL (ref 0.61–1.24)
GFR, Estimated: >60 mL/min (ref >60)
Glucose, Bld: 99 mg/dL (ref 70–99)
Potassium: 4.6 mmol/L (ref 3.5–5.1)
Sodium: 137 mmol/L (ref 135–145)

## 2021-04-16 MED ORDER — DOXYCYCLINE HYCLATE 100 MG PO CAPS: 100.0000 mg | ORAL_CAPSULE | Freq: Two times a day (BID) | ORAL | 0 refills | Status: AC

## 2021-04-16 MED ORDER — CEFDINIR 300 MG PO CAPS: 300.0000 mg | ORAL_CAPSULE | Freq: Two times a day (BID) | ORAL | 0 refills | Status: AC

## 2021-04-16 MED ORDER — SODIUM CHLORIDE 0.9 % IV SOLN
100.0000 mg | Freq: Once | INTRAVENOUS | Status: AC
  Administered 2021-04-16: 100 mg via INTRAVENOUS
  Filled 2021-04-16: qty 100

## 2021-04-16 MED ORDER — SODIUM CHLORIDE 0.9 % IV SOLN: 500.0000 mg | Freq: Once | INTRAVENOUS | Status: DC

## 2021-04-16 MED ORDER — SODIUM CHLORIDE 0.9 % IV SOLN
1.0000 g | Freq: Once | INTRAVENOUS | Status: AC
  Administered 2021-04-16: 1 g via INTRAVENOUS
  Filled 2021-04-16: qty 10

## 2021-04-16 NOTE — ED Notes (Signed)
Justin Solomon, daughter, [phone] would like an update when available

## 2021-04-16 NOTE — ED Provider Notes (Signed)
[facility] EMERGENCY DEPARTMENT Provider Note   CSN: [id number] Arrival date & time: 04/16/21  2052     History Chief Complaint  Patient presents with   Hypoxia    Justin Solomon is a 70 y.o. male.  HPI Patient is a 71 year old male with past medical history significant for stroke, A. fib, chronic paralysis, trach dependent, quadriplegic, respiratory failure requiring ICU.  "Pt from [facility].  Staff were attempting to wean patient off of vent today but O2 sats dropped and took a while to recover after placing back on vent. Pt was reportedly tachycardic and hypotensive as well.  Upon [facility] arrival to transport pt his vital signs were stable.  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   Pneumonia due to methicillin resistant Staphylococcus aureus (MRSA) (HCC)    Encephalopathy    Acute on chronic respiratory failure with hypoxia (HCC) 02/06/2021   Pressure injury of skin 02/03/2021   Rectal bleeding 02/02/2021   Quadriparesis (HCC) 02/02/2021   Atrial fibrillation (HCC) 02/02/2021   ARF (acute renal failure) (HCC) 02/02/2021   Essential hypertension 02/02/2021   Acute blood loss anemia 02/02/2021   Acute bleeding 02/02/2021   Hemorrhagic shock (HCC)     Past Surgical History:  Procedure Laterality Date   ESOPHAGOGASTRODUODENOSCOPY N/A 02/02/2021   Procedure: ESOPHAGOGASTRODUODENOSCOPY (EGD);  Surgeon: Kathi Der, MD;  Location: [facility];  Service: Gastroenterology;  Laterality: N/A;   FLEXIBLE SIGMOIDOSCOPY N/A 02/02/2021   Procedure:  FLEXIBLE SIGMOIDOSCOPY;  Surgeon: Kathi Der, MD;  Location: [facility]COPY;  Service: Gastroenterology;  Laterality: N/A;   PEG TUBE PLACEMENT     TRACHEOSTOMY         Family History  Problem Relation Age of Onset   Diabetes Mellitus II Neg Hx     Social History   Tobacco Use   Smoking status: Former   Smokeless tobacco: Never  Substance Use Topics   Alcohol use: Not Currently   Drug use: Not Currently    Home Medications Prior to Admission medications   Medication Sig Start Date End Date Taking? Authorizing Provider  cefdinir (OMNICEF) 300 MG capsule Take 1 capsule (300 mg total) by mouth 2 (two) times daily for 7 days. 04/16/21 04/23/21 Yes Ajahni Nay, Rodrigo Ran, PA  doxycycline (VIBRAMYCIN) 100 MG capsule Take 1 capsule (100 mg total) by mouth 2 (two) times daily for 7 days. 04/16/21 04/23/21 Yes Annastasia Haskins, Rodrigo Ran, PA  acetaminophen (TYLENOL) 325 MG tablet Place 2 tablets (650 mg total) into feeding tube every 6 (six) hours as needed for mild pain (or Fever >/= 101). 02/15/21   Eliezer Champagne, NP  amiodarone (PACERONE) 200 MG tablet Place 200 mg into feeding tube daily. 12/30/20   [provider]  aspirin 81 MG chewable tablet Place 1 tablet (81 mg total) into feeding tube daily. 02/16/21   Eliezer Champagne, NP  chlorhexidine (PERIDEX) 0.12 % solution 15 mLs by Mouth Rinse route 2 (two) times daily. 02/15/21   Eliezer Champagne, NP  Chlorhexidine Gluconate Cloth 2 %  PADS Apply 6 each topically daily. 02/16/21   Eliezer Champagne, NP  docusate (COLACE) 50 MG/5ML liquid Place 10 mLs (100 mg total) into feeding tube 2 (two) times daily. 02/15/21   Eliezer Champagne, NP  insulin aspart (NOVOLOG) 100 UNIT/ML injection Inject 0-9 Units into the skin every 4 (four) hours. 02/15/21   Eliezer Champagne, NP  Mouthwashes (MOUTH RINSE) LIQD solution 15 mLs by Mouth Rinse route 2 times daily at 12 noon and 4 pm. 02/15/21   Eliezer Champagne, NP  Nutritional Supplements (FEEDING SUPPLEMENT,  OSMOLITE 1.5 CAL,) LIQD Place 1,000 mLs into feeding tube continuous. 02/15/21   Eliezer Champagne, NP  Nutritional Supplements (FEEDING SUPPLEMENT, PROSOURCE TF,) liquid Place 45 mLs into feeding tube 2 (two) times daily. 02/15/21   Eliezer Champagne, NP  pantoprazole sodium (PROTONIX) 40 mg/20 mL PACK Place 20 mLs (40 mg total) into feeding tube 2 (two) times daily. 02/15/21   Eliezer Champagne, NP  polyethylene glycol (MIRALAX / GLYCOLAX) 17 g packet Place 17 g into feeding tube daily as needed for mild constipation. 02/15/21   Eliezer Champagne, NP  Water For Irrigation, Sterile (FREE WATER) SOLN Place 400 mLs into feeding tube every 4 (four) hours. 02/15/21   Eliezer Champagne, NP    Allergies    Patient has no known allergies.  Review of Systems   Review of Systems  Unable to perform ROS: Patient nonverbal   Physical Exam Updated Vital Signs BP 111/70   Pulse 91   Temp 98.1 F (36.7 C) (Temporal)   Resp (!) 21   Ht 6' (1.829 m)   Wt 99.8 kg   SpO2 100%   BMI 29.84 kg/m   Physical Exam Vitals and nursing note reviewed.  Constitutional:      Comments: Non-ill-appearing, nontoxic appearing 70 year old male with tracheostomy on ventilator Nonverbal at baseline.  Does not respond to verbal or painful stimuli.  HENT:     Head: Normocephalic and atraumatic.     Nose: Nose normal.     Mouth/Throat:     Mouth: Mucous membranes are moist.  Eyes:     General: No scleral icterus. Cardiovascular:     Rate and Rhythm: Normal rate and regular rhythm.     Pulses: Normal pulses.     Heart sounds: Normal heart sounds.  Pulmonary:     Effort: Pulmonary effort is normal. No respiratory distress.     Breath sounds: No wheezing.  Abdominal:     Palpations: Abdomen is soft.     Tenderness: There is no abdominal tenderness. There is no guarding or rebound.  Musculoskeletal:     Cervical back: Normal range of motion.     Right lower leg: No edema.     Left lower leg: No edema.  Skin:     General: Skin is warm and dry.     Capillary Refill: Capillary refill takes less than 2 seconds.     Comments: Chronic appearing wounds with dressings that were removed and replaced my examination these are located in the posterior scalp, left and right scapular region of the upper back.  Bilateral feet in inflatable foot pads no ulcerations present.  There is some dead dried skin here.  Neurological:     Mental Status: Mental status is at baseline.     Comments: At patient's neurologic baseline which is nonresponsive, nonverbal, does not follow commands.  Psychiatric:        Mood and Affect: Mood  normal.        Behavior: Behavior normal.    ED Results / Procedures / Treatments   Labs (all labs ordered are listed, but only abnormal results are displayed) Labs Reviewed  CBC WITH DIFFERENTIAL/PLATELET - Abnormal; Notable for the following components:      Result Value   WBC 13.9 (*)    RBC 2.55 (*)    Hemoglobin 7.2 (*)    HCT 23.7 (*)    RDW 16.2 (*)    Neutro Abs 11.3 (*)    Abs Immature Granulocytes 0.08 (*)    All other components within normal limits  BASIC METABOLIC PANEL - Abnormal; Notable for the following components:   BUN 42 (*)    Calcium 10.4 (*)    All other components within normal limits    EKG EKG Interpretation  Date/Time:  Saturday April 16 2021 21:28:16 EDT Ventricular Rate:  87 PR Interval:  159 QRS Duration: 100 QT Interval:  362 QTC Calculation: 436 R Axis:   63 Text Interpretation: Sinus rhythm Low voltage, extremity leads No significant change since last tracing Confirmed by Linwood Dibbles [phone]) on 04/16/2021 9:41:53 PM  Radiology DG Chest Portable 1 View  Result Date: 04/16/2021 CLINICAL DATA:  Hypoxia and hypotension. EXAM: PORTABLE CHEST 1 VIEW COMPARISON:  March 19, 2021 FINDINGS: There is stable tracheostomy tube and right-sided PICC line positioning. Mild areas of atelectasis and/or infiltrate are seen within the bilateral lung bases, right  greater than left. This is decreased in severity when compared to the prior study. The heart size and mediastinal contours are within normal limits. A radiopaque fusion plate and screws are seen overlying the lower cervical spine. Degenerative changes seen within the thoracic spine. IMPRESSION: Mild bibasilar atelectasis and/or infiltrate, right greater than left. Electronically Signed   By: Aram Candela M.D.   On: 04/16/2021 21:49    Procedures Procedures   Medications Ordered in ED Medications  cefTRIAXone (ROCEPHIN) 1 g in sodium chloride 0.9 % 100 mL IVPB (has no administration in time range)  doxycycline (VIBRAMYCIN) 100 mg in sodium chloride 0.9 % 250 mL IVPB (has no administration in time range)    ED Course  I have reviewed the triage vital signs and the nursing notes.  Pertinent labs & imaging results that were available during my care of the patient were reviewed by me and considered in my medical decision making (see chart for details).  Patient is chronic trach dependent on ventilator patient nonverbal and does not respond or follow commands at baseline.  Was brought to ER after he was found to be hypoxic for a brief period of time thought to be approximately minutes this was earlier today.  SPO2 dropped to 84%.  This recovered however patient did seem to be somewhat diaphoretic during the episode.  This occurred when he was taking his medications presumably via PEG tube.  Clinical Course as of 04/16/21 2311  Sat Apr 16, 2021  2152 84% O2 earlier today while he was getting medications. She states the pulse ox read 84%  Non-verbal. Doesn't follow commands.   -Herminio Heads. [WF]    Clinical Course User Index [WF] Gailen Shelter, [place]   MDM Rules/Calculators/A&P                           CXR relatively unchanged from prior. Perhaps some infiltrate in LLL.   BMP unchanged.  CBC chronic ongoing anemia and ongoing leukocytosis.  I discussed patient's presentation  with [facility].  I also discussed with her the beginning and end of work-up with patient's son  He continues to wish patient be full code.  We will discharge patient home with antibiotics.  Briefly discussed with pharmacy will use cefdinir and doxycycline.  Patient will require transfer via ambulance given that he has on a ventilator.  Final Clinical Impression(s) / ED Diagnoses Final diagnoses:  Abnormal chest x-ray  Pneumonia of both lower lobes due to infectious organism    Rx / DC Orders ED Discharge Orders          Ordered    cefdinir (OMNICEF) 300 MG capsule  2 times daily        04/16/21 2304    doxycycline (VIBRAMYCIN) 100 MG capsule  2 times daily        04/16/21 2304             Solon AugustaFondaw, Hobie Kohles MyraS, [place] 04/17/21 0005    Linwood DibblesKnapp, Jon, MD 04/17/21 1344

## 2021-04-16 NOTE — Discharge Instructions (Addendum)
You have a questionable pneumonia on your chest x-ray this very well may be unchanged from your baseline long appearance.  However given that there is a episode of low oxygen levels earlier today I will go ahead and treat with antibiotics.  Please take these as prescribed.  You may also switch to using IV antibiotics given that you are facility able to do this.

## 2021-04-16 NOTE — ED Triage Notes (Addendum)
Pt from [facility].  Staff were attempting to wean patient off of vent today but O2 sats dropped and took a while to recover after placing back on vent. Pt was reportedly tachycardic and hypotensive as well.  Upon [facility] arrival to transport pt his vital signs were stable.  VSS at time of triage.

## 2022-01-03 ENCOUNTER — Inpatient Hospital Stay (HOSPITAL_COMMUNITY): Payer: Medicare Other

## 2022-01-03 ENCOUNTER — Emergency Department (HOSPITAL_COMMUNITY): Payer: Medicare Other

## 2022-01-03 ENCOUNTER — Encounter (HOSPITAL_COMMUNITY): Payer: Self-pay

## 2022-01-03 ENCOUNTER — Other Ambulatory Visit: Payer: Self-pay

## 2022-01-03 ENCOUNTER — Inpatient Hospital Stay (HOSPITAL_COMMUNITY)
Admission: EM | Admit: 2022-01-03 | Discharge: 2022-02-02 | DRG: 870 | Disposition: E | Payer: Medicare Other | Source: Other Acute Inpatient Hospital | Attending: Emergency Medicine | Admitting: Emergency Medicine

## 2022-01-03 DIAGNOSIS — Z930 Tracheostomy status: Secondary | ICD-10-CM

## 2022-01-03 DIAGNOSIS — I959 Hypotension, unspecified: Secondary | ICD-10-CM | POA: Diagnosis not present

## 2022-01-03 DIAGNOSIS — E875 Hyperkalemia: Secondary | ICD-10-CM | POA: Diagnosis present

## 2022-01-03 DIAGNOSIS — K626 Ulcer of anus and rectum: Secondary | ICD-10-CM | POA: Diagnosis present

## 2022-01-03 DIAGNOSIS — G8250 Quadriplegia, unspecified: Secondary | ICD-10-CM | POA: Diagnosis present

## 2022-01-03 DIAGNOSIS — K922 Gastrointestinal hemorrhage, unspecified: Secondary | ICD-10-CM | POA: Diagnosis not present

## 2022-01-03 DIAGNOSIS — A4189 Other specified sepsis: Secondary | ICD-10-CM | POA: Diagnosis present

## 2022-01-03 DIAGNOSIS — Z7401 Bed confinement status: Secondary | ICD-10-CM

## 2022-01-03 DIAGNOSIS — I352 Nonrheumatic aortic (valve) stenosis with insufficiency: Secondary | ICD-10-CM | POA: Diagnosis present

## 2022-01-03 DIAGNOSIS — L89310 Pressure ulcer of right buttock, unstageable: Secondary | ICD-10-CM | POA: Diagnosis present

## 2022-01-03 DIAGNOSIS — Z960 Presence of urogenital implants: Secondary | ICD-10-CM | POA: Diagnosis present

## 2022-01-03 DIAGNOSIS — I3139 Other pericardial effusion (noninflammatory): Secondary | ICD-10-CM | POA: Diagnosis present

## 2022-01-03 DIAGNOSIS — Z79899 Other long term (current) drug therapy: Secondary | ICD-10-CM

## 2022-01-03 DIAGNOSIS — Z515 Encounter for palliative care: Secondary | ICD-10-CM

## 2022-01-03 DIAGNOSIS — G928 Other toxic encephalopathy: Secondary | ICD-10-CM | POA: Diagnosis present

## 2022-01-03 DIAGNOSIS — R578 Other shock: Secondary | ICD-10-CM | POA: Diagnosis not present

## 2022-01-03 DIAGNOSIS — J810 Acute pulmonary edema: Secondary | ICD-10-CM | POA: Diagnosis not present

## 2022-01-03 DIAGNOSIS — N179 Acute kidney failure, unspecified: Secondary | ICD-10-CM | POA: Diagnosis present

## 2022-01-03 DIAGNOSIS — L89112 Pressure ulcer of right upper back, stage 2: Secondary | ICD-10-CM | POA: Diagnosis present

## 2022-01-03 DIAGNOSIS — J90 Pleural effusion, not elsewhere classified: Secondary | ICD-10-CM | POA: Diagnosis present

## 2022-01-03 DIAGNOSIS — Z9911 Dependence on respirator [ventilator] status: Secondary | ICD-10-CM | POA: Diagnosis not present

## 2022-01-03 DIAGNOSIS — R0609 Other forms of dyspnea: Secondary | ICD-10-CM | POA: Diagnosis not present

## 2022-01-03 DIAGNOSIS — Z7982 Long term (current) use of aspirin: Secondary | ICD-10-CM

## 2022-01-03 DIAGNOSIS — Y732 Prosthetic and other implants, materials and accessory gastroenterology and urology devices associated with adverse incidents: Secondary | ICD-10-CM | POA: Diagnosis present

## 2022-01-03 DIAGNOSIS — A419 Sepsis, unspecified organism: Secondary | ICD-10-CM | POA: Diagnosis present

## 2022-01-03 DIAGNOSIS — I351 Nonrheumatic aortic (valve) insufficiency: Secondary | ICD-10-CM | POA: Diagnosis not present

## 2022-01-03 DIAGNOSIS — L89154 Pressure ulcer of sacral region, stage 4: Secondary | ICD-10-CM | POA: Diagnosis present

## 2022-01-03 DIAGNOSIS — R6521 Severe sepsis with septic shock: Secondary | ICD-10-CM | POA: Diagnosis present

## 2022-01-03 DIAGNOSIS — R609 Edema, unspecified: Secondary | ICD-10-CM | POA: Diagnosis not present

## 2022-01-03 DIAGNOSIS — Z6832 Body mass index (BMI) 32.0-32.9, adult: Secondary | ICD-10-CM

## 2022-01-03 DIAGNOSIS — E871 Hypo-osmolality and hyponatremia: Secondary | ICD-10-CM | POA: Diagnosis present

## 2022-01-03 DIAGNOSIS — Z87891 Personal history of nicotine dependence: Secondary | ICD-10-CM

## 2022-01-03 DIAGNOSIS — I4819 Other persistent atrial fibrillation: Secondary | ICD-10-CM | POA: Diagnosis present

## 2022-01-03 DIAGNOSIS — D649 Anemia, unspecified: Secondary | ICD-10-CM | POA: Diagnosis not present

## 2022-01-03 DIAGNOSIS — E440 Moderate protein-calorie malnutrition: Secondary | ICD-10-CM | POA: Diagnosis present

## 2022-01-03 DIAGNOSIS — Z66 Do not resuscitate: Secondary | ICD-10-CM | POA: Diagnosis not present

## 2022-01-03 DIAGNOSIS — S31000A Unspecified open wound of lower back and pelvis without penetration into retroperitoneum, initial encounter: Secondary | ICD-10-CM | POA: Diagnosis present

## 2022-01-03 DIAGNOSIS — T85838A Hemorrhage due to other internal prosthetic devices, implants and grafts, initial encounter: Secondary | ICD-10-CM | POA: Diagnosis present

## 2022-01-03 DIAGNOSIS — R7881 Bacteremia: Secondary | ICD-10-CM | POA: Diagnosis present

## 2022-01-03 DIAGNOSIS — I10 Essential (primary) hypertension: Secondary | ICD-10-CM | POA: Diagnosis present

## 2022-01-03 DIAGNOSIS — E8770 Fluid overload, unspecified: Secondary | ICD-10-CM | POA: Diagnosis present

## 2022-01-03 DIAGNOSIS — I4891 Unspecified atrial fibrillation: Secondary | ICD-10-CM | POA: Diagnosis present

## 2022-01-03 DIAGNOSIS — R627 Adult failure to thrive: Secondary | ICD-10-CM | POA: Diagnosis present

## 2022-01-03 DIAGNOSIS — J9811 Atelectasis: Secondary | ICD-10-CM | POA: Diagnosis present

## 2022-01-03 DIAGNOSIS — Z9181 History of falling: Secondary | ICD-10-CM

## 2022-01-03 DIAGNOSIS — J9621 Acute and chronic respiratory failure with hypoxia: Secondary | ICD-10-CM | POA: Diagnosis present

## 2022-01-03 DIAGNOSIS — E861 Hypovolemia: Secondary | ICD-10-CM | POA: Diagnosis present

## 2022-01-03 DIAGNOSIS — R579 Shock, unspecified: Secondary | ICD-10-CM

## 2022-01-03 DIAGNOSIS — I82621 Acute embolism and thrombosis of deep veins of right upper extremity: Secondary | ICD-10-CM | POA: Diagnosis present

## 2022-01-03 DIAGNOSIS — I440 Atrioventricular block, first degree: Secondary | ICD-10-CM | POA: Diagnosis present

## 2022-01-03 DIAGNOSIS — Z931 Gastrostomy status: Secondary | ICD-10-CM

## 2022-01-03 DIAGNOSIS — D62 Acute posthemorrhagic anemia: Secondary | ICD-10-CM | POA: Diagnosis present

## 2022-01-03 DIAGNOSIS — Z8673 Personal history of transient ischemic attack (TIA), and cerebral infarction without residual deficits: Secondary | ICD-10-CM

## 2022-01-03 DIAGNOSIS — R5381 Other malaise: Secondary | ICD-10-CM | POA: Diagnosis present

## 2022-01-03 DIAGNOSIS — L8990 Pressure ulcer of unspecified site, unspecified stage: Secondary | ICD-10-CM | POA: Diagnosis present

## 2022-01-03 DIAGNOSIS — G9340 Encephalopathy, unspecified: Secondary | ICD-10-CM | POA: Diagnosis present

## 2022-01-03 DIAGNOSIS — R7989 Other specified abnormal findings of blood chemistry: Secondary | ICD-10-CM | POA: Diagnosis present

## 2022-01-03 DIAGNOSIS — L89122 Pressure ulcer of left upper back, stage 2: Secondary | ICD-10-CM | POA: Diagnosis present

## 2022-01-03 DIAGNOSIS — Z794 Long term (current) use of insulin: Secondary | ICD-10-CM

## 2022-01-03 LAB — COMPREHENSIVE METABOLIC PANEL
ALT: 41 U/L (ref 0–44)
AST: 68 U/L — ABNORMAL HIGH (ref 15–41)
Albumin: <1.5 g/dL — ABNORMAL LOW (ref 3.5–5.0)
Alkaline Phosphatase: 113 U/L (ref 38–126)
Anion gap: 12 (ref 5–15)
BUN: 124 mg/dL — ABNORMAL HIGH (ref 8–23)
CO2: 18 mmol/L — ABNORMAL LOW (ref 22–32)
Calcium: 8.2 mg/dL — ABNORMAL LOW (ref 8.9–10.3)
Chloride: 86 mmol/L — ABNORMAL LOW (ref 98–111)
Creatinine, Ser: 4.07 mg/dL — ABNORMAL HIGH (ref 0.61–1.24)
GFR, Estimated: 15 mL/min — ABNORMAL LOW (ref >60)
Glucose, Bld: 113 mg/dL — ABNORMAL HIGH (ref 70–99)
Potassium: 6.9 mmol/L (ref 3.5–5.1)
Sodium: 116 mmol/L — CL (ref 135–145)
Total Bilirubin: 0.8 mg/dL (ref 0.3–1.2)
Total Protein: 7.1 g/dL (ref 6.5–8.1)

## 2022-01-03 LAB — CBC WITH DIFFERENTIAL/PLATELET
Abs Immature Granulocytes: 0.24 10*3/uL — ABNORMAL HIGH (ref 0.00–0.07)
Basophils Absolute: 0 10*3/uL (ref 0.0–0.1)
Basophils Relative: 0 %
Eosinophils Absolute: 0 10*3/uL (ref 0.0–0.5)
Eosinophils Relative: 0 %
HCT: 20.8 % — ABNORMAL LOW (ref 39.0–52.0)
Hemoglobin: 6.8 g/dL — CL (ref 13.0–17.0)
Immature Granulocytes: 2 %
Lymphocytes Relative: 6 %
Lymphs Abs: 0.9 10*3/uL (ref 0.7–4.0)
MCH: 28.2 pg (ref 26.0–34.0)
MCHC: 32.7 g/dL (ref 30.0–36.0)
MCV: 86.3 fL (ref 80.0–100.0)
Monocytes Absolute: 1.6 10*3/uL — ABNORMAL HIGH (ref 0.1–1.0)
Monocytes Relative: 11 %
Neutro Abs: 12.6 10*3/uL — ABNORMAL HIGH (ref 1.7–7.7)
Neutrophils Relative %: 81 %
Platelets: 92 10*3/uL — ABNORMAL LOW (ref 150–400)
RBC: 2.41 MIL/uL — ABNORMAL LOW (ref 4.22–5.81)
RDW: 15.1 % (ref 11.5–15.5)
WBC: 15.5 10*3/uL — ABNORMAL HIGH (ref 4.0–10.5)
nRBC: 0 % (ref 0.0–0.2)

## 2022-01-03 LAB — TROPONIN I (HIGH SENSITIVITY)
Troponin I (High Sensitivity): 55 ng/L — ABNORMAL HIGH (ref <18)
Troponin I (High Sensitivity): 57 ng/L — ABNORMAL HIGH (ref <18)

## 2022-01-03 LAB — PREPARE RBC (CROSSMATCH)

## 2022-01-03 LAB — PROTIME-INR
INR: 1.1 (ref 0.8–1.2)
Prothrombin Time: 13.9 seconds (ref 11.4–15.2)

## 2022-01-03 LAB — I-STAT CHEM 8, ED
BUN: >130 mg/dL — ABNORMAL HIGH (ref 8–23)
Calcium, Ion: 1.04 mmol/L — ABNORMAL LOW (ref 1.15–1.40)
Chloride: 86 mmol/L — ABNORMAL LOW (ref 98–111)
Creatinine, Ser: 4.9 mg/dL — ABNORMAL HIGH (ref 0.61–1.24)
Glucose, Bld: 115 mg/dL — ABNORMAL HIGH (ref 70–99)
HCT: 22 % — ABNORMAL LOW (ref 39.0–52.0)
Hemoglobin: 7.5 g/dL — ABNORMAL LOW (ref 13.0–17.0)
Potassium: 6 mmol/L — ABNORMAL HIGH (ref 3.5–5.1)
Sodium: 115 mmol/L — CL (ref 135–145)
TCO2: 20 mmol/L — ABNORMAL LOW (ref 22–32)

## 2022-01-03 LAB — I-STAT VENOUS BLOOD GAS, ED
Acid-base deficit: 3 mmol/L — ABNORMAL HIGH (ref 0.0–2.0)
Bicarbonate: 21.7 mmol/L (ref 20.0–28.0)
Calcium, Ion: 1.12 mmol/L — ABNORMAL LOW (ref 1.15–1.40)
HCT: 21 % — ABNORMAL LOW (ref 39.0–52.0)
Hemoglobin: 7.1 g/dL — ABNORMAL LOW (ref 13.0–17.0)
O2 Saturation: 97 %
Potassium: 6 mmol/L — ABNORMAL HIGH (ref 3.5–5.1)
Sodium: 117 mmol/L — CL (ref 135–145)
TCO2: 23 mmol/L (ref 22–32)
pCO2, Ven: 34 mmHg — ABNORMAL LOW (ref 44–60)
pH, Ven: 7.414 (ref 7.25–7.43)
pO2, Ven: 90 mmHg — ABNORMAL HIGH (ref 32–45)

## 2022-01-03 LAB — GLUCOSE, CAPILLARY
Glucose-Capillary: 111 mg/dL — ABNORMAL HIGH (ref 70–99)
Glucose-Capillary: 118 mg/dL — ABNORMAL HIGH (ref 70–99)

## 2022-01-03 LAB — PHOSPHORUS: Phosphorus: 4.6 mg/dL (ref 2.5–4.6)

## 2022-01-03 LAB — MRSA NEXT GEN BY PCR, NASAL: MRSA by PCR Next Gen: DETECTED — AB

## 2022-01-03 LAB — LACTIC ACID, PLASMA
Lactic Acid, Venous: 1.5 mmol/L (ref 0.5–1.9)
Lactic Acid, Venous: 1.6 mmol/L (ref 0.5–1.9)

## 2022-01-03 LAB — MAGNESIUM: Magnesium: 1.7 mg/dL (ref 1.7–2.4)

## 2022-01-03 LAB — PROCALCITONIN: Procalcitonin: 0.58 ng/mL

## 2022-01-03 LAB — BRAIN NATRIURETIC PEPTIDE: B Natriuretic Peptide: 149 pg/mL — ABNORMAL HIGH (ref 0.0–100.0)

## 2022-01-03 MED ORDER — SODIUM CHLORIDE 0.9 % IV SOLN
1.0000 g | Freq: Two times a day (BID) | INTRAVENOUS | Status: DC
  Administered 2022-01-03 – 2022-01-04 (×3): 1 g via INTRAVENOUS
  Filled 2022-01-03 (×4): qty 20

## 2022-01-03 MED ORDER — LACTATED RINGERS IV BOLUS
1000.0000 mL | Freq: Once | INTRAVENOUS | Status: AC
  Administered 2022-01-03: 1000 mL via INTRAVENOUS

## 2022-01-03 MED ORDER — DEXTROSE 50 % IV SOLN
1.0000 | Freq: Once | INTRAVENOUS | Status: AC
  Administered 2022-01-03: 50 mL via INTRAVENOUS
  Filled 2022-01-03: qty 50

## 2022-01-03 MED ORDER — SODIUM CHLORIDE 0.9% FLUSH
10.0000 mL | Freq: Two times a day (BID) | INTRAVENOUS | Status: DC
  Administered 2022-01-03 – 2022-01-04 (×3): 10 mL
  Administered 2022-01-05 – 2022-01-06 (×2): 30 mL
  Administered 2022-01-06 – 2022-01-08 (×4): 10 mL

## 2022-01-03 MED ORDER — OSMOLITE 1.5 CAL PO LIQD
40.0000 mL | ORAL | Status: DC
  Administered 2022-01-03: 40 mL
  Filled 2022-01-03 (×2): qty 237

## 2022-01-03 MED ORDER — SODIUM CHLORIDE 0.9% IV SOLUTION
Freq: Once | INTRAVENOUS | Status: AC
  Administered 2022-01-03: 10 mL via INTRAVENOUS

## 2022-01-03 MED ORDER — SODIUM CHLORIDE 0.9% FLUSH: 10.0000 mL | INTRAVENOUS | Status: DC | PRN

## 2022-01-03 MED ORDER — ORAL CARE MOUTH RINSE
15.0000 mL | OROMUCOSAL | Status: DC
  Administered 2022-01-03 – 2022-01-08 (×47): 15 mL via OROMUCOSAL

## 2022-01-03 MED ORDER — ACETAMINOPHEN 325 MG PO TABS
650.0000 mg | ORAL_TABLET | Freq: Four times a day (QID) | ORAL | Status: DC | PRN
  Administered 2022-01-03 – 2022-01-04 (×3): 650 mg
  Filled 2022-01-03 (×3): qty 2

## 2022-01-03 MED ORDER — PANTOPRAZOLE 2 MG/ML SUSPENSION
40.0000 mg | Freq: Every day | ORAL | Status: DC
  Filled 2022-01-03: qty 20

## 2022-01-03 MED ORDER — PRO-STAT SUGAR FREE PO LIQD
30.0000 mL | Freq: Two times a day (BID) | ORAL | Status: DC
  Administered 2022-01-03: 30 mL
  Filled 2022-01-03 (×2): qty 30

## 2022-01-03 MED ORDER — NOREPINEPHRINE 4 MG/250ML-% IV SOLN
0.0000 ug/min | INTRAVENOUS | Status: DC
  Administered 2022-01-03: 2 ug/min via INTRAVENOUS
  Filled 2022-01-03: qty 250

## 2022-01-03 MED ORDER — STERILE WATER FOR INJECTION IV SOLN
INTRAMUSCULAR | Status: DC
  Filled 2022-01-03: qty 1000

## 2022-01-03 MED ORDER — MUPIROCIN 2 % EX OINT
1.0000 "application " | TOPICAL_OINTMENT | Freq: Two times a day (BID) | CUTANEOUS | Status: AC
  Administered 2022-01-03 – 2022-01-08 (×10): 1 via NASAL
  Filled 2022-01-03 (×3): qty 22

## 2022-01-03 MED ORDER — ASPIRIN 81 MG PO CHEW
81.0000 mg | CHEWABLE_TABLET | Freq: Every day | ORAL | Status: DC
  Administered 2022-01-03 – 2022-01-04 (×2): 81 mg
  Filled 2022-01-03 (×2): qty 1

## 2022-01-03 MED ORDER — SODIUM CHLORIDE 0.9 % IV SOLN
250.0000 mL | INTRAVENOUS | Status: DC
  Administered 2022-01-03 – 2022-01-08 (×4): 250 mL via INTRAVENOUS

## 2022-01-03 MED ORDER — HEPARIN SODIUM (PORCINE) 5000 UNIT/ML IJ SOLN
5000.0000 [IU] | Freq: Three times a day (TID) | INTRAMUSCULAR | Status: DC
  Administered 2022-01-03 – 2022-01-07 (×11): 5000 [IU] via SUBCUTANEOUS
  Filled 2022-01-03 (×11): qty 1

## 2022-01-03 MED ORDER — FERROUS SULFATE 300 (60 FE) MG/5ML PO SYRP
300.0000 mg | ORAL_SOLUTION | Freq: Every day | ORAL | Status: DC
  Administered 2022-01-03 – 2022-01-08 (×6): 300 mg
  Filled 2022-01-03 (×6): qty 5

## 2022-01-03 MED ORDER — CHLORHEXIDINE GLUCONATE 0.12% ORAL RINSE (MEDLINE KIT)
15.0000 mL | Freq: Two times a day (BID) | OROMUCOSAL | Status: DC
  Administered 2022-01-03 – 2022-01-08 (×10): 15 mL via OROMUCOSAL

## 2022-01-03 MED ORDER — INSULIN ASPART 100 UNIT/ML IV SOLN
5.0000 [IU] | Freq: Once | INTRAVENOUS | Status: AC
  Administered 2022-01-03: 5 [IU] via INTRAVENOUS

## 2022-01-03 MED ORDER — CHLORHEXIDINE GLUCONATE CLOTH 2 % EX PADS
6.0000 | MEDICATED_PAD | Freq: Every day | CUTANEOUS | Status: DC
  Administered 2022-01-04 – 2022-01-07 (×4): 6 via TOPICAL

## 2022-01-03 MED ORDER — NOREPINEPHRINE 4 MG/250ML-% IV SOLN
0.0000 ug/min | INTRAVENOUS | Status: DC
  Administered 2022-01-03: 14 ug/min via INTRAVENOUS
  Administered 2022-01-03: 10 ug/min via INTRAVENOUS
  Filled 2022-01-03: qty 250

## 2022-01-03 MED ORDER — NUTRISOURCE FIBER PO PACK
1.0000 | PACK | Freq: Every day | ORAL | Status: DC
  Administered 2022-01-03 – 2022-01-08 (×6): 1
  Filled 2022-01-03 (×6): qty 1

## 2022-01-03 MED ORDER — PANTOPRAZOLE 2 MG/ML SUSPENSION
40.0000 mg | Freq: Every day | ORAL | Status: DC
  Administered 2022-01-03 – 2022-01-04 (×2): 40 mg
  Filled 2022-01-03 (×3): qty 20

## 2022-01-03 MED ORDER — CALCIUM GLUCONATE-NACL 1-0.675 GM/50ML-% IV SOLN
1.0000 g | Freq: Once | INTRAVENOUS | Status: AC
  Administered 2022-01-03: 1000 mg via INTRAVENOUS
  Filled 2022-01-03: qty 50

## 2022-01-03 MED ORDER — SODIUM BICARBONATE 8.4 % IV SOLN
Freq: Once | INTRAVENOUS | Status: DC
  Filled 2022-01-03: qty 1000

## 2022-01-03 MED ORDER — ADULT MULTIVITAMIN W/MINERALS CH
1.0000 | ORAL_TABLET | Freq: Every day | ORAL | Status: DC
  Administered 2022-01-03 – 2022-01-08 (×6): 1
  Filled 2022-01-03 (×6): qty 1

## 2022-01-03 MED ORDER — SODIUM ZIRCONIUM CYCLOSILICATE 10 G PO PACK
10.0000 g | PACK | Freq: Every day | ORAL | Status: DC
Start: 2022-01-03 — End: 2022-01-04
  Administered 2022-01-03: 10 g
  Filled 2022-01-03: qty 1

## 2022-01-03 NOTE — Progress Notes (Signed)
eLink Physician-Brief Progress Note ?Patient Name: Justin Solomon ?DOB: 1951-02-23 ?MRN: [id number] ? ? ?Date of Service ? 01/14/2022  ?HPI/Events of Note ? Patient has a nasty looking Foley catheter from an outside hospital.  ?eICU Interventions ? Order entered to change Foley catheter.  ? ? ? ?  ? ?Kerry Kass Pressley Barsky ?01/29/2022, 10:46 PM ?

## 2022-01-03 NOTE — ED Triage Notes (Signed)
Patient brought in via [facility] from [facility]. Per staff patient has abnormal kidneyt labs. Patient is trached with indwelling urine cath, PICC, and flexiseal.  ?

## 2022-01-03 NOTE — Progress Notes (Signed)
Pharmacy Antibiotic Note ? ?Justin Solomon is a 71 y.o. male admitted on 01/15/2022 with concerns for sepsis.  Pharmacy has been consulted for Meropenem dosing. ? ?Plan: ?Meropenem 1000 mg IV q12h ?Monitor clinical status, renal function ?Follow-up cultures, LOT, narrow as able  ? ?Height: 6' (182.9 cm) ?IBW/kg (Calculated) : 77.6 ? ?Temp (24hrs), Avg:98.4 ?F (36.9 ?C), Min:98.4 ?F (36.9 ?C), Max:98.4 ?F (36.9 ?C) ? ?Recent Labs  ?Lab 01/15/22 ?1425 2022/01/15 ?1436 Jan 15, 2022 ?1437 15-Jan-2022 ?1600  ?CREATININE 4.90* 4.07*  --   --   ?LATICACIDVEN  --   --  1.5 1.6  ?  ?CrCl cannot be calculated (Unknown ideal weight.).   ? ?No Known Allergies ? ?Antimicrobials this admission: ?Meropenem 5/2 >>  ? ? ?Microbiology results: ?5/2 BCx: in process ?5/2 UCx: needs to be collected   ? ?Thank you for allowing pharmacy to be a part of this patient?s care. ? ?Jerrilyn Cairo, PharmD ?PGY1 Pharmacy Resident ?01-15-22 5:49 PM  ? ?Please check AMION for all [facility] phone numbers ?After 10:00 PM, call Main Pharmacy [phone] ? ? ?

## 2022-01-03 NOTE — Progress Notes (Signed)
Pt transported on vent from 013 to 3M04 without any complications. RN at bedside, RT will continue to monitor. ?

## 2022-01-03 NOTE — Progress Notes (Signed)
Pt positive for MRSA PCR. Standing orders placed per protocol. ?

## 2022-01-03 NOTE — ED Provider Notes (Signed)
?[facility] ?Provider Note ? ? ?CSN: [id number] ?Arrival date & time: 01/23/2022  1358 ? ?  ? ?History ? ?Chief Complaint  ?Patient presents with  ? Weakness  ? ? ?Justin Solomon is a 71 y.o. male. ? ?71 year old male with prior medical history as detailed below presents for evaluation.  Patient is trached and vent dependent.  Patient with PICC line, PEG tube, Foley catheter in place. ? ?Patient is nonverbal at baseline. ? ?Staff at [facility] noted gradual increase in BUN and creatinine.  Patient was sent to the [facility], ED today for evaluation of sodium of 118, creatinine of 3.5 and elevated BUN. ? ?The history is provided by medical records and the EMS personnel.  ?Illness ?Location:  Hyponatremia, elevated BUN, elevated creatinine ?Severity:  Severe ?Onset quality:  Unable to specify ?Timing:  Unable to specify ?Progression:  Unable to specify ? ?  ? ?Home Medications ?Prior to Admission medications   ?Medication Sig Start Date End Date Taking? Authorizing Provider  ?acetaminophen (TYLENOL) 325 MG tablet Place 2 tablets (650 mg total) into feeding tube every 6 (six) hours as needed for mild pain (or Fever >/= 101). 02/15/21   Estill Cotta, NP  ?amiodarone (PACERONE) 200 MG tablet Place 200 mg into feeding tube daily. 12/30/20   [provider]  ?aspirin 81 MG chewable tablet Place 1 tablet (81 mg total) into feeding tube daily. 02/16/21   Estill Cotta, NP  ?chlorhexidine (PERIDEX) 0.12 % solution 15 mLs by Mouth Rinse route 2 (two) times daily. 02/15/21   Estill Cotta, NP  ?Chlorhexidine Gluconate Cloth 2 % PADS Apply 6 each topically daily. 02/16/21   Estill Cotta, NP  ?docusate (COLACE) 50 MG/5ML liquid Place 10 mLs (100 mg total) into feeding tube 2 (two) times daily. 02/15/21   Estill Cotta, NP  ?insulin aspart (NOVOLOG) 100 UNIT/ML injection Inject 0-9 Units into the skin every 4 (four) hours. 02/15/21   Estill Cotta, NP  ?Mouthwashes (MOUTH  RINSE) LIQD solution 15 mLs by Mouth Rinse route 2 times daily at 12 noon and 4 pm. 02/15/21   Estill Cotta, NP  ?Nutritional Supplements (FEEDING SUPPLEMENT, OSMOLITE 1.5 CAL,) LIQD Place 1,000 mLs into feeding tube continuous. 02/15/21   Estill Cotta, NP  ?Nutritional Supplements (FEEDING SUPPLEMENT, PROSOURCE TF,) liquid Place 45 mLs into feeding tube 2 (two) times daily. 02/15/21   Estill Cotta, NP  ?pantoprazole sodium (PROTONIX) 40 mg/20 mL PACK Place 20 mLs (40 mg total) into feeding tube 2 (two) times daily. 02/15/21   Estill Cotta, NP  ?polyethylene glycol (MIRALAX / GLYCOLAX) 17 g packet Place 17 g into feeding tube daily as needed for mild constipation. 02/15/21   Estill Cotta, NP  ?Water For Irrigation, Sterile (FREE WATER) SOLN Place 400 mLs into feeding tube every 4 (four) hours. 02/15/21   Estill Cotta, NP  ?   ? ?Allergies    ?Patient has no known allergies.   ? ?Review of Systems   ?Review of Systems  ?All other systems reviewed and are negative. ? ?Physical Exam ?Updated Vital Signs ?BP (!) 94/50   Pulse 82   Temp 98.4 ?F (36.9 ?C) (Oral)   Resp (!) 22   Ht 6' (1.829 m)   SpO2 99%   BMI 29.84 kg/m?  ?Physical Exam ?Vitals and nursing note reviewed.  ?Constitutional:   ?   General: He is not in acute distress. ?  Appearance: He is well-developed.  ?   Comments: Nonverbal, trached and vent dependent ? ?PEG tube in place, Foley catheter placed, PICC line in place  ?HENT:  ?   Head: Normocephalic and atraumatic.  ?Eyes:  ?   Conjunctiva/sclera: Conjunctivae normal.  ?   Pupils: Pupils are equal, round, and reactive to light.  ?Cardiovascular:  ?   Rate and Rhythm: Normal rate and regular rhythm.  ?   Heart sounds: Normal heart sounds.  ?Pulmonary:  ?   Effort: Pulmonary effort is normal. No respiratory distress.  ?   Breath sounds: Normal breath sounds.  ?Abdominal:  ?   General: There is no distension.  ?   Palpations: Abdomen is soft.  ?   Tenderness: There is no  abdominal tenderness.  ?Musculoskeletal:     ?   General: No deformity.  ?   Cervical back: Neck supple.  ?Skin: ?   General: Skin is warm and dry.  ?Neurological:  ?   Comments: Nonverbal at baseline  ? ? ?ED Results / Procedures / Treatments   ?Labs ?(all labs ordered are listed, but only abnormal results are displayed) ?Labs Reviewed  ?URINE CULTURE  ?CULTURE, BLOOD (ROUTINE X 2)  ?CULTURE, BLOOD (ROUTINE X 2)  ?COMPREHENSIVE METABOLIC PANEL  ?CBC WITH DIFFERENTIAL/PLATELET  ?PROTIME-INR  ?LACTIC ACID, PLASMA  ?LACTIC ACID, PLASMA  ?BRAIN NATRIURETIC PEPTIDE  ?URINALYSIS, ROUTINE W REFLEX MICROSCOPIC  ?I-STAT CHEM 8, ED  ?TROPONIN I (HIGH SENSITIVITY)  ? ? ?EKG ?None ? ?Radiology ?No results found. ? ?Procedures ?Procedures  ? ? ?Medications Ordered in ED ?Medications  ?lactated ringers bolus 1,000 mL (1,000 mLs Intravenous New Bag/Given 01/04/2022 1416)  ? ? ?ED Course/ Medical Decision Making/ A&P ?  ?                        ?Medical Decision Making ?Amount and/or Complexity of Data Reviewed ?Labs: ordered. ?Radiology: ordered. ? ?Risk ?OTC drugs. ?Prescription drug management. ?Decision regarding hospitalization. ? ? ? ?Medical Screen Complete ? ?This patient presented to the ED with complaint of hyponatremia, elevated BUN and creatinine. ? ?This complaint involves an extensive number of treatment options. The initial differential diagnosis includes, but is not limited to, AKI, metabolic abnormality, etc. ? ?This presentation is: Acute, Chronic, Self-Limited, Previously Undiagnosed, Uncertain Prognosis, Complicated, Systemic Symptoms, and Threat to Life/Bodily Function ? ?Patient is presenting from [place] for evaluation of metabolic derangements.  Noted abnormalities include a sodium of 118 and elevated BUN and creatinine from baseline. ? ?Screening labs obtained here in the ED. ? ?Initial IV fluid resuscitation initiated here in the ED. ? ?Critical care made aware of case and will evaluate for  admission. ? ? ? ?Co morbidities that complicated the patient's evaluation ? ?Nonverbal at baseline, encephalopathy, trach and vent dependent ? ? ?Additional history obtained: ? ?Additional history obtained from EMS ?External records from outside sources obtained and reviewed including prior ED visits and prior Inpatient records.  ? ? ?Lab Tests: ? ?I ordered and personally interpreted labs.  The pertinent results include: CBC, CMP, i-STAT Chem-8, BNP, troponin, blood cultures, UA, urine culture ? ? ?Imaging Studies ordered: ? ?I ordered imaging studies including chest x-ray ?I independently visualized and interpreted obtained imaging which showed NAD ?I agree with the radiologist interpretation. ? ? ?Cardiac Monitoring: ? ?The patient was maintained on a cardiac monitor.  I personally viewed and interpreted the cardiac monitor which showed an underlying rhythm of: NSR ? ? ?  Medicines ordered: ? ?I ordered medication including IV fluid for suspected dehydration ?Reevaluation of the patient after these medicines showed that the patient: improved ? ?Problem List / ED Course: ? ?Hyponatremia, AKI ? ? ?Reevaluation: ? ?After the interventions noted above, I reevaluated the patient and found that they have: improved ? ? ?Disposition: ? ?After consideration of the diagnostic results and the patients response to treatment, I feel that the patent would benefit from admission.  ? ? ?CRITICAL CARE ?Performed by: Valarie Merino ? ? ?Total critical care time: 30 minutes ? ?Critical care time was exclusive of separately billable procedures and treating other patients. ? ?Critical care was necessary to treat or prevent imminent or life-threatening deterioration. ? ?Critical care was time spent personally by me on the following activities: development of treatment plan with patient and/or surrogate as well as nursing, discussions with consultants, evaluation of patient's response to treatment, examination of patient, obtaining  history from patient or surrogate, ordering and performing treatments and interventions, ordering and review of laboratory studies, ordering and review of radiographic studies, pulse oximetry and re-evaluation of patient's condition

## 2022-01-03 NOTE — H&P (Addendum)
NAME:  Justin Solomon, MRN:  [id number], DOB:  [birth year], LOS: 0 ADMISSION DATE:  Jan 09, 2022, CONSULTATION DATE:  Jan 09, 2022 REFERRING MD:  Dr. Rodena Medin, CHIEF COMPLAINT:  hypotension/ abnormal labs  History of Present Illness:  HPI obtained from medical chart review as patient has tracheostomy and currently on mechanical ventilation.    71 year old male with prior history as below presenting from [facility] for abnormal labs> progressive AKI and hyponatremia.  Reportedly was on antibiotics at [facility] - possibly zosyn and meropenem, unclear indication after brief review of notes accompanying patient.  In ER, patient afebrile, hypotensive with SBP in the 70's, and normal saturations on mechanical ventilation (PRVC 16/ 500/ 5/ 28).  Labs significant for Na 116, K 6.9, Cl 86, bicarb 18, BUN 124, sCr 4.07 (prior 0.78), AST 68, albumin < 1.5, trop hs 55, lactic 1.5, EKG showing first degree block, rate 82.  CXR showing increased cardiomegaly and/or pericardial effusion with mild pulmonary edema, possible LLL infiltrate and small left pleural effusion; right PICC in place.  Blood cultures sent, pending UA.     Treated with 2L LR but no improvement therefore started on levophed.  Hyperkalemia treated with insulin, D50, calcium gluconate, and bicarb.   PCCM called for admit.   Pertinent  Medical History  Chronic respiratory failure on MV, trach, peg, chronic foley, Afib, CVA, cervical fx/ epidural hematoma 2/2 fall resulting in quadriplegia 3/22, MRSA PNA, esophagitis   Significant Hospital Events: Including procedures, antibiotic start and stop dates in addition to other pertinent events   5/2 admitted with AKI, hyperkalemia, hyponatremia  Interim History / Subjective:  N/a  Objective   Blood pressure (!) 71/42, pulse 81, temperature 98.4 F (36.9 C), temperature source Oral, resp. rate (!) 22, height 6' (1.829 m), SpO2 100 %.    Vent Mode: PRVC FiO2 (%):  [28 %] 28 % Set Rate:  [16 bmp] 16  bmp Vt Set:  [500 mL] 500 mL PEEP:  [5 cmH20] 5 cmH20 Plateau Pressure:  [11 cmH20-15 cmH20] 11 cmH20  No intake or output data in the 24 hours ending 01-09-22 1644 There were no vitals filed for this visit.  Examination: General appearance: 71 y.o., male, trach/vent, chronically ill appearing Eyes: tracks  Neck: 7-0 portex trach in place Lungs: rhonchi bl equal chest rise CV: RRR, no murmur  Abdomen: Soft, non-tender; non-distended, BS hypoactive Extremities: RUE edematous, warm Neuro: opens eyes to voice and does not follow commands  Na 116 -> 117 K 6.9 -> 6 Bicarb 18 BUN 124 S Cr 4.07 Albumin <1.5  CXR with cardiomegaly, likely LLL atelectasis, R perihilar coarsened interstitial markings   Resolved Hospital Problem list    Assessment & Plan:   # Chronic respiratory failure on MV/ trach/ PEG On home vent settings without significantly worsened secretion burden. - continue MV support, PRVC 16/ 500/ 5/ 28 - VAP prevention protocol/ PPI - goal SpO2 >92%   # Shock Unclear etiology - possible septic shock but source unclear. Multiple potential sources - UA not yet collected, sacral decubitus, bacteremia (PICC in place). Does have cardiomegaly, was not very fluid responsive and IVC is distended on TTE raising possibility of cardiogenic or obstructive shock. - wound care consult - c diff assay, BCx, UA start meropenem narrow as able with low threshold to dc at 48h if infx workup unrevealing - TTE  - cortisol in the morning  # Possible acute metabolic encephalopathy: Tracks to voice for my exam which reportedly isn't far  from baseline? - CTM response to measures addressing sepsis, AKI, hyponatremia  # Hyponatremia: Wasn't very responsive to trial of volume expansion in ED under the assumption he was hypovolemic. Bedside US with distended IVC with some respiratory variation - seems euvolemic to mildly hypervolemic.  - will try bicarb infusion at 50/hr as below - BMP q4  #  AKI with hyperkalemia  Unclear if/why he was taking trimethoprim and whether that may have contributed to his AKI and hyponatremia as well. - renal US, UA - trend BMP - lokelma - bicarb gtt as above - avoid nephrotoxins  # Severe protein calorie malnutrition - continue TF  # RUE swelling - US DVT RUE  # AF - hold metop - on home amiodarone - has not been on [facility] due to GIB but this was in 2022 per last DC summary  # HTN - hold doxazosin     Best Practice (right click and "Reselect all SmartList Selections" daily)   Diet/type: tubefeeds DVT prophylaxis: systemic heparin  GI prophylaxis: PPI Lines: yes and it is still needed Foley:  Yes, and it is still needed Code Status:  full code Last date of multidisciplinary goals of care discussion [Son updated at bedside, confirmed code status]  Labs   CBC: Recent Labs  Lab 2022-01-18 1425  HGB 7.5*  HCT 22.0*    Basic Metabolic Panel: Recent Labs  Lab 01/18/2022 1425 01/18/22 1436  NA 115* 116*  K 6.0* 6.9*  CL 86* 86*  CO2  --  18*  GLUCOSE 115* 113*  BUN >130* 124*  CREATININE 4.90* 4.07*  CALCIUM  --  8.2*   GFR: CrCl cannot be calculated (Unknown ideal weight.). Recent Labs  Lab Jan 18, 2022 1437  LATICACIDVEN 1.5    Liver Function Tests: Recent Labs  Lab 2022-01-18 1436  AST 68*  ALT 41  ALKPHOS 113  BILITOT 0.8  PROT 7.1  ALBUMIN <1.5*   No results for input(s): LIPASE, AMYLASE in the last 168 hours. No results for input(s): AMMONIA in the last 168 hours.  ABG    Component Value Date/Time   PHART 7.390 03/14/2021 0933   PCO2ART 43.5 03/14/2021 0933   PO2ART 187 (H) 03/14/2021 0933   HCO3 25.8 03/14/2021 0933   TCO2 20 (L) 2022-01-18 1425   ACIDBASEDEF 11.0 (H) 02/10/2021 0809   O2SAT 99.5 03/14/2021 0933     Coagulation Profile: Recent Labs  Lab 2022/01/18 1436  INR 1.1    Cardiac Enzymes: No results for input(s): CKTOTAL, CKMB, CKMBINDEX, TROPONINI in the last 168  hours.  HbA1C: Hgb A1c MFr Bld  Date/Time Value Ref Range Status  02/16/2021 12:44 AM 5.5 4.8 - 5.6 % Final    Comment:    (NOTE)         Prediabetes: 5.7 - 6.4         Diabetes: >6.4         Glycemic control for adults with diabetes: <7.0   02/10/2021 03:13 AM 5.4 4.8 - 5.6 % Final    Comment:    (NOTE)         Prediabetes: 5.7 - 6.4         Diabetes: >6.4         Glycemic control for adults with diabetes: <7.0     CBG: No results for input(s): GLUCAP in the last 168 hours.  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   Surgical History:   Past Surgical History:  Procedure Laterality Date   ESOPHAGOGASTRODUODENOSCOPY N/A 02/02/2021   Procedure: ESOPHAGOGASTRODUODENOSCOPY (EGD);  Surgeon: Kathi Der, MD;  Location: [facility];  Service: Gastroenterology;  Laterality: N/A;   FLEXIBLE SIGMOIDOSCOPY N/A 02/02/2021   Procedure: FLEXIBLE SIGMOIDOSCOPY;  Surgeon: Kathi Der, MD;  Location: [facility];  Service: Gastroenterology;  Laterality: N/A;   PEG TUBE PLACEMENT     TRACHEOSTOMY       Social History:   reports that he has quit smoking. He has never used smokeless tobacco. He reports that he does not currently use alcohol. He reports that he does not currently use drugs.   Family History:  His family history is negative for Diabetes Mellitus II.   Allergies No Known Allergies   Home Medications  Prior to Admission medications   Medication Sig Start Date End Date Taking? Authorizing Provider  acetaminophen (TYLENOL) 325 MG tablet Place 2 tablets (650 mg total) into feeding tube every 6 (six) hours as needed for mild pain (or Fever >/= 101). 02/15/21  Yes Eliezer Champagne, NP  Amino Acids-Protein Hydrolys (FEEDING SUPPLEMENT, PRO-STAT SUGAR FREE  64,) LIQD Place 30 mLs into feeding tube 2 (two) times daily.   Yes [provider]  amiodarone (PACERONE) 200 MG tablet Place 200 mg into feeding tube daily. 12/30/20  Yes [provider]  doxazosin (CARDURA) 2 MG tablet Place 2 mg into feeding tube daily.   Yes [provider]  esomeprazole (NEXIUM) 40 MG packet Take 40 mg by mouth daily before breakfast. Granules for suspension(40 mg) via g-tube   Yes [provider]  ferrous sulfate 300 (60 Fe) MG/5ML syrup Place 300 mg into feeding tube daily.   Yes [provider]  fiber (NUTRISOURCE FIBER) PACK packet Place 1 packet into feeding tube in the morning, at noon, and at bedtime.   Yes [provider]  meropenem 1 g in sodium chloride 0.9 % 100 mL Inject 1 g into the vein every 8 (eight) hours. 7 day course   Yes [provider]  metoprolol tartrate (LOPRESSOR) 25 MG tablet Place 25 mg into feeding tube 2 (two) times daily.   Yes [provider]  Multiple Vitamin (MULTIVITAMIN) tablet Place 1 tablet into feeding tube daily.   Yes [provider]  Nutritional Supplements (ARGINAID) PACK 1 packet by Gastrostomy Tube route 2 (two) times daily.   Yes [provider]  Omega-3 1000 MG CAPS Take 1 capsule by mouth daily. 300 mg (DHA) + 120 mg (EPA) + 180 mg (fish oil), 1000 mg capsule   Yes [provider]  OVER THE COUNTER MEDICATION 400 mcg by Gastrostomy Tube route daily. Vitamin B complex/Vitamin C/Folic Acid ER 400mcg   Yes [provider]  sodium zirconium cyclosilicate (LOKELMA) 10 g PACK packet Place 10 g into feeding tube daily.   Yes [provider]  traMADol (ULTRAM) 50 MG tablet Take 50 mg by mouth every 6 (six) hours as needed for moderate pain.   Yes [provider]  trimethoprim (TRIMPEX) 100 MG tablet Place 100 mg into feeding tube daily.   Yes [provider]  vitamin C (ASCORBIC ACID) 500 MG tablet Take 500  mg by mouth every 8 (eight) hours.   Yes [provider]  aspirin 81 MG chewable tablet Place 1 tablet (81 mg total)  into feeding tube daily. 02/16/21   Eliezer Champagne, NP  chlorhexidine (PERIDEX) 0.12 % solution 15 mLs by Mouth Rinse route 2 (two) times daily. 02/15/21   Eliezer Champagne, NP  Chlorhexidine Gluconate Cloth 2 % PADS Apply 6 each topically daily. 02/16/21   Eliezer Champagne, NP  docusate (COLACE) 50 MG/5ML liquid Place 10 mLs (100 mg total) into feeding tube 2 (two) times daily. 02/15/21   Eliezer Champagne, NP  insulin aspart (NOVOLOG) 100 UNIT/ML injection Inject 0-9 Units into the skin every 4 (four) hours. 02/15/21   Eliezer Champagne, NP  Mouthwashes (MOUTH RINSE) LIQD solution 15 mLs by Mouth Rinse route 2 times daily at 12 noon and 4 pm. 02/15/21   Eliezer Champagne, NP  Nutritional Supplements (FEEDING SUPPLEMENT, OSMOLITE 1.5 CAL,) LIQD Place 1,000 mLs into feeding tube continuous. Patient taking differently: Place 40 mLs into feeding tube continuous. 40 ml/hr 02/15/21   Eliezer Champagne, NP  Nutritional Supplements (FEEDING SUPPLEMENT, PROSOURCE TF,) liquid Place 45 mLs into feeding tube 2 (two) times daily. 02/15/21   Eliezer Champagne, NP  pantoprazole sodium (PROTONIX) 40 mg/20 mL PACK Place 20 mLs (40 mg total) into feeding tube 2 (two) times daily. 02/15/21   Eliezer Champagne, NP  polyethylene glycol (MIRALAX / GLYCOLAX) 17 g packet Place 17 g into feeding tube daily as needed for mild constipation. Patient taking differently: Place 17 g into feeding tube 2 (two) times daily. 02/15/21   Eliezer Champagne, NP  Water For Irrigation, Sterile (FREE WATER) SOLN Place 400 mLs into feeding tube every 4 (four) hours. 02/15/21   Eliezer Champagne, NP     Critical care time: 35 minutes

## 2022-01-04 ENCOUNTER — Inpatient Hospital Stay (HOSPITAL_COMMUNITY): Payer: Medicare Other

## 2022-01-04 ENCOUNTER — Encounter (HOSPITAL_COMMUNITY): Payer: Self-pay | Admitting: Student

## 2022-01-04 DIAGNOSIS — N179 Acute kidney failure, unspecified: Secondary | ICD-10-CM | POA: Diagnosis not present

## 2022-01-04 DIAGNOSIS — R0609 Other forms of dyspnea: Secondary | ICD-10-CM | POA: Diagnosis not present

## 2022-01-04 DIAGNOSIS — I3139 Other pericardial effusion (noninflammatory): Secondary | ICD-10-CM

## 2022-01-04 DIAGNOSIS — R609 Edema, unspecified: Secondary | ICD-10-CM | POA: Diagnosis not present

## 2022-01-04 DIAGNOSIS — A419 Sepsis, unspecified organism: Secondary | ICD-10-CM

## 2022-01-04 DIAGNOSIS — R6521 Severe sepsis with septic shock: Secondary | ICD-10-CM | POA: Diagnosis not present

## 2022-01-04 DIAGNOSIS — E440 Moderate protein-calorie malnutrition: Secondary | ICD-10-CM | POA: Diagnosis present

## 2022-01-04 LAB — ECHOCARDIOGRAM COMPLETE
AR max vel: 1.69 cm2
AV Area VTI: 2 cm2
AV Area mean vel: 1.79 cm2
AV Mean grad: 20.3 mmHg
AV Peak grad: 37.1 mmHg
Ao pk vel: 3.05 m/s
Area-P 1/2: 6.32 cm2
Calc EF: 57.4 %
Height: 72 in
P 1/2 time: 361 msec
S' Lateral: 3 cm
Single Plane A2C EF: 57.3 %
Single Plane A4C EF: 57.9 %
Weight: 3668.45 oz

## 2022-01-04 LAB — OSMOLALITY, URINE: Osmolality, Ur: 283 mOsm/kg — ABNORMAL LOW (ref 300–900)

## 2022-01-04 LAB — CBC
HCT: 25.6 % — ABNORMAL LOW (ref 39.0–52.0)
Hemoglobin: 8.5 g/dL — ABNORMAL LOW (ref 13.0–17.0)
MCH: 28.4 pg (ref 26.0–34.0)
MCHC: 33.2 g/dL (ref 30.0–36.0)
MCV: 85.6 fL (ref 80.0–100.0)
Platelets: 97 10*3/uL — ABNORMAL LOW (ref 150–400)
RBC: 2.99 MIL/uL — ABNORMAL LOW (ref 4.22–5.81)
RDW: 14.9 % (ref 11.5–15.5)
WBC: 21 10*3/uL — ABNORMAL HIGH (ref 4.0–10.5)
nRBC: 0 % (ref 0.0–0.2)

## 2022-01-04 LAB — GLUCOSE, CAPILLARY
Glucose-Capillary: 140 mg/dL — ABNORMAL HIGH (ref 70–99)
Glucose-Capillary: 134 mg/dL — ABNORMAL HIGH (ref 70–99)
Glucose-Capillary: 146 mg/dL — ABNORMAL HIGH (ref 70–99)
Glucose-Capillary: 152 mg/dL — ABNORMAL HIGH (ref 70–99)
Glucose-Capillary: 165 mg/dL — ABNORMAL HIGH (ref 70–99)
Glucose-Capillary: 124 mg/dL — ABNORMAL HIGH (ref 70–99)

## 2022-01-04 LAB — BASIC METABOLIC PANEL
Anion gap: 12 (ref 5–15)
Anion gap: 10 (ref 5–15)
Anion gap: 11 (ref 5–15)
BUN: 119 mg/dL — ABNORMAL HIGH (ref 8–23)
BUN: 117 mg/dL — ABNORMAL HIGH (ref 8–23)
BUN: 124 mg/dL — ABNORMAL HIGH (ref 8–23)
CO2: 21 mmol/L — ABNORMAL LOW (ref 22–32)
CO2: 20 mmol/L — ABNORMAL LOW (ref 22–32)
CO2: 21 mmol/L — ABNORMAL LOW (ref 22–32)
Calcium: 8.4 mg/dL — ABNORMAL LOW (ref 8.9–10.3)
Calcium: 8.2 mg/dL — ABNORMAL LOW (ref 8.9–10.3)
Calcium: 7.8 mg/dL — ABNORMAL LOW (ref 8.9–10.3)
Chloride: 84 mmol/L — ABNORMAL LOW (ref 98–111)
Chloride: 86 mmol/L — ABNORMAL LOW (ref 98–111)
Chloride: 86 mmol/L — ABNORMAL LOW (ref 98–111)
Creatinine, Ser: 3.97 mg/dL — ABNORMAL HIGH (ref 0.61–1.24)
Creatinine, Ser: 4.06 mg/dL — ABNORMAL HIGH (ref 0.61–1.24)
Creatinine, Ser: 4.01 mg/dL — ABNORMAL HIGH (ref 0.61–1.24)
GFR, Estimated: 15 mL/min — ABNORMAL LOW (ref >60)
GFR, Estimated: 15 mL/min — ABNORMAL LOW (ref >60)
GFR, Estimated: 15 mL/min — ABNORMAL LOW (ref >60)
Glucose, Bld: 122 mg/dL — ABNORMAL HIGH (ref 70–99)
Glucose, Bld: 145 mg/dL — ABNORMAL HIGH (ref 70–99)
Glucose, Bld: 149 mg/dL — ABNORMAL HIGH (ref 70–99)
Potassium: 5.5 mmol/L — ABNORMAL HIGH (ref 3.5–5.1)
Potassium: 5.3 mmol/L — ABNORMAL HIGH (ref 3.5–5.1)
Potassium: 5 mmol/L (ref 3.5–5.1)
Sodium: 117 mmol/L — CL (ref 135–145)
Sodium: 116 mmol/L — CL (ref 135–145)
Sodium: 118 mmol/L — CL (ref 135–145)

## 2022-01-04 LAB — URINALYSIS, ROUTINE W REFLEX MICROSCOPIC
Bilirubin Urine: NEGATIVE
Glucose, UA: NEGATIVE mg/dL
Ketones, ur: NEGATIVE mg/dL
Nitrite: NEGATIVE
Protein, ur: 100 mg/dL — AB
Specific Gravity, Urine: 1.014 (ref 1.005–1.030)
WBC, UA: >50 WBC/hpf — ABNORMAL HIGH (ref 0–5)
pH: 5 (ref 5.0–8.0)

## 2022-01-04 LAB — PHOSPHORUS: Phosphorus: 5 mg/dL — ABNORMAL HIGH (ref 2.5–4.6)

## 2022-01-04 LAB — STREP PNEUMONIAE URINARY ANTIGEN: Strep Pneumo Urinary Antigen: NEGATIVE

## 2022-01-04 LAB — C DIFFICILE QUICK SCREEN W PCR REFLEX
C Diff antigen: NEGATIVE
C Diff interpretation: NOT DETECTED
C Diff toxin: NEGATIVE

## 2022-01-04 LAB — NA AND K (SODIUM & POTASSIUM), RAND UR
Potassium Urine: 29 mmol/L
Sodium, Ur: 23 mmol/L

## 2022-01-04 LAB — CORTISOL: Cortisol, Plasma: 21.1 ug/dL

## 2022-01-04 LAB — MAGNESIUM: Magnesium: 1.8 mg/dL (ref 1.7–2.4)

## 2022-01-04 MED ORDER — SODIUM ZIRCONIUM CYCLOSILICATE 5 G PO PACK: 5.0000 g | PACK | Freq: Two times a day (BID) | ORAL | Status: DC

## 2022-01-04 MED ORDER — MAGNESIUM SULFATE 2 GM/50ML IV SOLN
2.0000 g | Freq: Once | INTRAVENOUS | Status: AC
  Administered 2022-01-04: 2 g via INTRAVENOUS
  Filled 2022-01-04: qty 50

## 2022-01-04 MED ORDER — PROSOURCE TF PO LIQD
45.0000 mL | Freq: Four times a day (QID) | ORAL | Status: DC
  Administered 2022-01-04 – 2022-01-08 (×18): 45 mL
  Filled 2022-01-04 (×17): qty 45

## 2022-01-04 MED ORDER — NOREPINEPHRINE 16 MG/250ML-% IV SOLN
0.0000 ug/min | INTRAVENOUS | Status: DC
  Administered 2022-01-04: 20 ug/min via INTRAVENOUS
  Administered 2022-01-05 – 2022-01-07 (×2): 5 ug/min via INTRAVENOUS
  Filled 2022-01-04 (×3): qty 250

## 2022-01-04 MED ORDER — PROSOURCE TF PO LIQD
45.0000 mL | Freq: Two times a day (BID) | ORAL | Status: DC
Start: 2022-01-04 — End: 2022-01-04

## 2022-01-04 MED ORDER — ASCORBIC ACID 500 MG PO TABS
250.0000 mg | ORAL_TABLET | Freq: Two times a day (BID) | ORAL | Status: DC
  Administered 2022-01-04 – 2022-01-08 (×8): 250 mg
  Filled 2022-01-04 (×8): qty 1

## 2022-01-04 MED ORDER — SODIUM CHLORIDE 0.9 % IV BOLUS
1000.0000 mL | Freq: Once | INTRAVENOUS | Status: AC
  Administered 2022-01-04: 1000 mL via INTRAVENOUS

## 2022-01-04 MED ORDER — PROSOURCE PLUS PO LIQD
30.0000 mL | Freq: Two times a day (BID) | ORAL | Status: DC
  Administered 2022-01-04: 30 mL via ORAL
  Filled 2022-01-04 (×2): qty 30

## 2022-01-04 MED ORDER — OSMOLITE 1.5 CAL PO LIQD
1000.0000 mL | ORAL | Status: DC
  Administered 2022-01-04 – 2022-01-08 (×6): 1000 mL
  Filled 2022-01-04 (×2): qty 1000

## 2022-01-04 MED ORDER — AMIODARONE HCL 200 MG PO TABS
200.0000 mg | ORAL_TABLET | Freq: Every day | ORAL | Status: DC
  Administered 2022-01-04 – 2022-01-08 (×5): 200 mg
  Filled 2022-01-04 (×5): qty 1

## 2022-01-04 MED ORDER — SODIUM ZIRCONIUM CYCLOSILICATE 5 G PO PACK
5.0000 g | PACK | Freq: Two times a day (BID) | ORAL | Status: AC
  Administered 2022-01-04 (×2): 5 g
  Filled 2022-01-04 (×2): qty 1

## 2022-01-04 MED ORDER — STERILE WATER FOR INJECTION IV SOLN: INTRAVENOUS | Status: DC

## 2022-01-04 MED ORDER — ZINC SULFATE 220 (50 ZN) MG PO CAPS
220.0000 mg | ORAL_CAPSULE | Freq: Two times a day (BID) | ORAL | Status: DC
  Administered 2022-01-04 – 2022-01-08 (×8): 220 mg
  Filled 2022-01-04 (×8): qty 1

## 2022-01-04 MED ORDER — SODIUM ZIRCONIUM CYCLOSILICATE 10 G PO PACK
10.0000 g | PACK | Freq: Two times a day (BID) | ORAL | Status: AC
  Administered 2022-01-04 (×2): 10 g
  Filled 2022-01-04 (×2): qty 1

## 2022-01-04 NOTE — Progress Notes (Signed)
Initial Nutrition Assessment ? ?DOCUMENTATION CODES:  ? ?Non-severe (moderate) malnutrition in context of chronic illness ? ?INTERVENTION:  ? ?Tube feeding via PEG: ?- Increase Osmolite 1.5 to 60 ml/hr (1440 ml/day) ?- Add ProSource TF 45 ml QID ? ?Tube feeding regimen provides 2320 kcal, 134 grams of protein, and 1097 ml of H2O. ? ?- Checking vitamin A, vitamin C, and zinc labs given nonhealing wounds ? ?- Continue MVI with minerals daily per tube ? ?- Vitamin C 250 mg BID per tube to promote wound healing ? ?- Zinc sulfate 220 mg BID x 14 days per tube to promote wound healing ? ?- Once shock resolves, add 1 packet Juven BID per tube, each packet provides 95 calories, 2.5 grams of protein; also contains L-arginine and L-glutamine, vitamin C, vitamin E, vitamin B-12, zinc, calcium, and calcium Beta-hydroxy-Beta-methylbutyrate to support wound healing ? ?NUTRITION DIAGNOSIS:  ? ?Moderate Malnutrition related to chronic illness (chronic respiratory failure) as evidenced by energy intake < or equal to 75% for > or equal to 1 month, mild fat depletion. ? ?GOAL:  ? ?Patient will meet greater than or equal to 90% of their needs ? ?MONITOR:  ? ?Vent status, Labs, Weight trends, TF tolerance, Skin ? ?REASON FOR ASSESSMENT:  ? ?Ventilator, Consult ?Enteral/tube feeding initiation and management ? ?ASSESSMENT:  ? ?71 year old male who presented to the ED on 5/02 from [facility] with abnormal labs. PMH of chronic respiratory failure s/p trach requiring chronic vent support, s/p PEG, atrial fibrillation, CVA, cervical fx/epidural hematoma secondary fall resulting in quadriplegia, MRSA PNA, esophagitis. Pt admitted with AKI, hyperkalemia, hyponatremia, shock. ? ?Discussed pt with RN and during ICU rounds. Pt with septic shock, possible lung vs urinary source. ? ?Pt currently receiving tube feeding regimen from [facility] which is Osmolite 1.5 @ 40 ml/hr with Pro-stat 30 ml BID. This provides 1640 kcal and 90 grams of protein which  meets 71% of pt's estimated kcal needs and 69% of estimated protein needs. RD to adjust tube feeding regimen to better meet pt's needs. ? ?Feeding tube is charted as a PEG-J tube. Per abdominal x-ray in June 2022, pt with regular PEG tube. RD visualized tube at time of visit and confirmed that tube is a regular PEG tube with no J-port present. ? ?Reviewed weight history in chart. Weight stable over the last 11 months between 99-104 kg. No evidence of weight loss. ? ?Pt with moderate chronic malnutrition. Noted mild fat depletions. Cannot utilize muscle depletions in diagnosing malnutrition as pt is quadriplegic and bedbound at baseline. Given pt's typical tube feeding regimen meets <75% of estimated needs, pt meets criteria for moderate chronic malnutrition. ? ?TF regimen at [facility]: Osmolite 1.5 @ 40 ml/hr, Pro-stat 30 ml BID ? ?Patient is on chronic ventilator support via trach ?MV: 12.7 L/min ?Temp (24hrs), Avg:97.7 ?F (36.5 ?C), Min:96.8 ?F (36 ?C), Max:98.5 ?F (36.9 ?C) ? ?Drips: ?Levophed ? ?Medications reviewed and include: ferrous sulfate, nutrisource fiber daily, MVI with minerals, protonix, lokelma 10 grams x 2, IV abx ? ?Labs reviewed: sodium 116, potassium 5.3, BUN 117, creatinine 4.06, phosphorus 5.0, WBC 21.0, hemoglobin 8.5 ?CBG's: 111-140 x 24 hours ? ?I/O's: +1.9 L since admit ? ?NUTRITION - FOCUSED PHYSICAL EXAM: ? ?Flowsheet Row Most Recent Value  ?Orbital Region Mild depletion  ?Upper Arm Region Mild depletion  ?Thoracic and Lumbar Region Mild depletion  ?Buccal Region No depletion  ?Temple Region No depletion  ?Clavicle Bone Region Mild depletion  ?Clavicle and Acromion Bone Region Moderate depletion  ?  Scapular Bone Region Unable to assess  ?Dorsal Hand No depletion  ?Patellar Region Mild depletion  ?Anterior Thigh Region Moderate depletion  ?Posterior Calf Region Moderate depletion  ?Edema (RD Assessment) Moderate  [generalized]  ?Hair Reviewed  ?Eyes Reviewed  ?Mouth Reviewed  ?Skin Reviewed   ?Nails Reviewed  ? ?  ? ? ?Diet Order:   ?Diet Order   ? ?       ?  Diet NPO time specified  Diet effective now       ?  ? ?  ?  ? ?  ? ? ?EDUCATION NEEDS:  ? ?No education needs have been identified at this time ? ?Skin:  Skin Assessment: ?Skin Integrity Issues: ?DTI: vertebral column (per RN assessment) ?Stage IV: sacrum (per WOC note) ?Unstageable: R buttock (per WOC note) ?Other: full thickness wounds to RLE, R upper back, L upper back, and L ischial tuberosity (per WOC note) ? ?Last BM:  01/04/22 rectal tube ? ?Height:  ? ?Ht Readings from Last 1 Encounters:  ?01/11/2022 6' (1.829 m)  ? ? ?Weight:  ? ?Wt Readings from Last 1 Encounters:  ?01/04/22 104 kg  ? ? ?Ideal Body Weight:  72.8 kg (adjusted for quadriplegia)  ? ?BMI:  Body mass index is 31.1 kg/m?. ? ?Estimated Nutritional Needs:  ? ?Kcal:  2300-2500 ? ?Protein:  130-150 grams ? ?Fluid:  >/= 2.0 L ? ? ? ?Gustavus Bryant, MS, RD, LDN ?Inpatient Clinical Dietitian ?Please see AMiON for contact information. ? ?

## 2022-01-04 NOTE — Progress Notes (Signed)
Right upper extremity venous duplex completed. ?Refer to "CV Proc" under chart review to view preliminary results. ? ?Preliminary results discussed with Dr. Judeth Horn. ? ?01/04/2022 2:28 PM ?Eula Fried., MHA, RVT, RDCS, RDMS   ?

## 2022-01-04 NOTE — Progress Notes (Signed)
eLink Physician-Brief Progress Note ?Patient Name: Justin Solomon ?DOB: 1951-02-08 ?MRN: [id number] ? ? ?Date of Service ? 01/04/2022  ?HPI/Events of Note ? K+ 5.3, Mg++ 1.8, Na+ 116  ?eICU Interventions ? K+ trending down after Lokelma, 2 gm of iv  Mg+sulfate ordered.   ? ? ? ?  ? ?Kerry Kass Zanetta Dehaan ?01/04/2022, 5:34 AM ?

## 2022-01-04 NOTE — Consult Note (Signed)
?Cardiology Consultation:  ? ?Patient ID: Justin Solomon ?MRN: [id number]; DOB: [birth year] ? ?Admit date: 01/28/2022 ?Date of Consult: 01/04/2022 ? ?PCP:  Aaron Edelman, MD ?  ?[facility] Providers ?Cardiologist:  None      ? ? ?Patient Profile:  ? ?Justin Solomon is a 71 y.o. male with a hx of aortic stenosis in 2016, HTN, now was at [facility] with trach, on mechanical ventilation, progressive AKI, hyponatremia presented to ER with hypotension and abnormal labs who is being seen 01/04/2022 for the evaluation of pericardial effusion possible tamponade at the request of Dr. Silas Flood. ? ?History of Present Illness:  ? ?Justin Solomon admitted from [facility] 01/12/2022 with hypotension and hyponatremia at 116 and hyperkalemia at 6.9, Cr 4.06 prior per note 0.78  also anemia with hgb 8.5 .  Hx of atrial fib no longer on anticoagulation due to GI bleed.  Recent EKGS with SR currently SR to Junctional with significantly long PR interval.  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  He has trach and PEG.   ? ?In ER BP in 42A systolic    ?Hs troponin flat at 55 and 57 ?Mg+ 1.8  ? ?EKG:  The EKG was personally reviewed and demonstrates:  Junctional/SR with long PR interval, QRS widened as well compared to 04/2021 ?Telemetry:  Telemetry was personally reviewed and demonstrates:  sinus to junct ? ?Echo today with 2.42 cm pericardial effusion firbrinous strands within effusion).  ?There is respiratory partial collapse of right ventricular free wall. IVC is dilated. There is suggestion of impending cardiac tamponade. Large pericardial effusion. The pericardial  ?effusion is circumferential. Findings are consistent with cardiac  ?tamponade.   EF 60-65%, mild LVH G1 DD  AR is mod to severe and mod AS  aortic dilation noted ascending aorta 44 mm.  ? ?BP 103/57 to  130/56  p 118 Resp 23 on norepinephrine drip  ? ?Past Medical History:  ?Diagnosis Date  ? Acute respiratory failure ([place])   ? Atrial fibrillation ([place])    ? Essential hypertension i  ? 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 Quadraplegic  ? ? ?Past Surgical History:  ?Procedure Laterality Date  ? ESOPHAGOGASTRODUODENOSCOPY N/A 02/02/2021  ? Procedure: ESOPHAGOGASTRODUODENOSCOPY (EGD);  Surgeon: Otis Brace, MD;  Location: [facility];  Service: Gastroenterology;  Laterality: N/A;  ? FLEXIBLE SIGMOIDOSCOPY N/A 02/02/2021  ? Procedure: FLEXIBLE SIGMOIDOSCOPY;  Surgeon: Otis Brace, MD;  Location: [facility]OSCOPY;  Service: Gastroenterology;  Laterality: N/A;  ? PEG TUBE PLACEMENT    ? TRACHEOSTOMY    ?  ? ?Home Medications:  ?Prior to Admission medications   ?Medication Sig Start Date End Date Taking? Authorizing Provider  ?acetaminophen (TYLENOL) 325 MG tablet Place 2 tablets (650 mg total) into feeding tube every 6 (six) hours as needed for mild pain (or Fever >/= 101). 02/15/21  Yes Estill Cotta, NP  ?Amino Acids-Protein Hydrolys (FEEDING SUPPLEMENT, PRO-STAT SUGAR FREE 64,) LIQD Place 30 mLs into feeding tube 2 (two) times daily.   Yes [provider]  ?amiodarone (PACERONE) 200 MG tablet Place 200 mg into feeding tube daily. 12/30/20  Yes [provider]  ?doxazosin (CARDURA) 2 MG tablet Place 2 mg into feeding tube daily.   Yes [provider]  ?esomeprazole (NEXIUM) 40 MG packet 40 mg daily before breakfast. Per tube   Yes [provider]  ?ferrous sulfate 300 (60 Fe) MG/5ML syrup Place 300 mg into feeding tube daily.  Yes [provider]  ?fiber (NUTRISOURCE FIBER) PACK packet Place 1 packet into feeding tube in the morning, at noon, and at bedtime.   Yes [provider]  ?Lidocaine, Anorectal, 5 % CREA Apply 1 application. topically every 12 (twelve) hours as needed (pain).   Yes [provider]  ?meropenem 1 g in sodium chloride 0.9 % 100 mL Inject 1 g into the vein every 8 (eight) hours. 7 day course   Yes [provider]  ?metoprolol tartrate (LOPRESSOR) 25 MG tablet Place 25  mg into feeding tube 2 (two) times daily.   Yes [provider]  ?Multiple Vitamin (MULTIVITAMIN) tablet Place 1 tablet into feeding tube daily.   Yes [provider]  ?Nutritional Supplements (ARGINAID) PACK Place 1 packet into feeding tube 2 (two) times daily. 4.5 gram-156 mg/9.2 gram   Yes [provider]  ?Nutritional Supplements (FEEDING SUPPLEMENT, OSMOLITE 1.5 CAL,) LIQD Place 1,000 mLs into feeding tube continuous. ?Patient taking differently: Place 40 mL/hr into feeding tube continuous. 02/15/21  Yes Estill Cotta, NP  ?Omega-3 1000 MG CAPS Place 1 capsule into feeding tube daily. 300 mg (DHA) + 120 mg (EPA) + 180 mg (fish oil), 1000 mg capsule   Yes [provider]  ?OVER THE COUNTER MEDICATION Place 1 tablet into feeding tube daily. Vitamin B complex/Vitamin C/Folic Acid ER [id number]   Yes [provider]  ?polyethylene glycol (MIRALAX / GLYCOLAX) 17 g packet Place 17 g into feeding tube daily as needed for mild constipation. ?Patient taking differently: Place 17 g into feeding tube 2 (two) times daily. 02/15/21  Yes Estill Cotta, NP  ?sodium zirconium cyclosilicate (LOKELMA) 10 g PACK packet Place 10 g into feeding tube daily.   Yes [provider]  ?traMADol (ULTRAM) 50 MG tablet Take 50 mg by mouth every 6 (six) hours as needed for moderate pain.   Yes [provider]  ?trimethoprim (TRIMPEX) 100 MG tablet Place 100 mg into feeding tube daily.   Yes [provider]  ?vitamin C (ASCORBIC ACID) 500 MG tablet Place 500 mg into feeding tube every 8 (eight) hours.   Yes [provider]  ?aspirin 81 MG chewable tablet Place 1 tablet (81 mg total) into feeding tube daily. ?Patient not taking: Reported on 01/18/2022 02/16/21   Estill Cotta, NP  ?chlorhexidine (PERIDEX) 0.12 % solution 15 mLs by Mouth Rinse route 2 (two) times daily. ?Patient not taking: Reported on 01/15/2022 02/15/21   Estill Cotta, NP  ?Chlorhexidine  Gluconate Cloth 2 % PADS Apply 6 each topically daily. ?Patient not taking: Reported on 01/14/2022 02/16/21   Estill Cotta, NP  ?docusate (COLACE) 50 MG/5ML liquid Place 10 mLs (100 mg total) into feeding tube 2 (two) times daily. ?Patient not taking: Reported on 01/20/2022 02/15/21   Estill Cotta, NP  ?insulin aspart (NOVOLOG) 100 UNIT/ML injection Inject 0-9 Units into the skin every 4 (four) hours. ?Patient not taking: Reported on 01/17/2022 02/15/21   Estill Cotta, NP  ?Nutritional Supplements (FEEDING SUPPLEMENT, PROSOURCE TF,) liquid Place 45 mLs into feeding tube 2 (two) times daily. ?Patient not taking: Reported on 01/28/2022 02/15/21   Estill Cotta, NP  ?Water For Irrigation, Sterile (FREE WATER) SOLN Place 400 mLs into feeding tube every 4 (four) hours. ?Patient not taking: Reported on 01/26/2022 02/15/21   Estill Cotta, NP  ? ? ?Inpatient Medications: ?Scheduled Meds: ? amiodarone  200 mg Per Tube Daily  ? vitamin C  250 mg Per Tube BID  ? chlorhexidine gluconate (MEDLINE KIT)  15 mL Mouth Rinse BID  ? Chlorhexidine Gluconate Cloth  6 each Topical Daily  ? feeding supplement (PROSource TF)  45 mL Per Tube QID  ? ferrous sulfate  300 mg Per Tube Daily  ? fiber  1 packet Per Tube Daily  ? heparin  5,000 Units Subcutaneous Q8H  ? mouth rinse  15 mL Mouth Rinse 10 times per day  ? multivitamin with minerals  1 tablet Per Tube Daily  ? mupirocin ointment  1 application. Nasal BID  ? pantoprazole sodium  40 mg Per Tube Daily  ? sodium chloride flush  10-40 mL Intracatheter Q12H  ? sodium zirconium cyclosilicate  10 g Per Tube BID  ? zinc sulfate  220 mg Per Tube BID  ? ?Continuous Infusions: ? sodium chloride Stopped (01/04/22 1025)  ? feeding supplement (OSMOLITE 1.5 CAL) 1,000 mL (01/04/22 1200)  ? meropenem (MERREM) IV Stopped (01/04/22 0677)  ? norepinephrine (LEVOPHED) Adult infusion 3 mcg/min (01/04/22 1500)  ? ?PRN Meds: ?acetaminophen, sodium chloride flush ? ?Allergies:   No Known  Allergies ? ?Social History:   ?Social History  ? ?Socioeconomic History  ? Marital status: Single  ?  Spouse name: Not on file  ? Number of children: Not on file  ? Years of education: Not on file  ? Highest educa

## 2022-01-04 NOTE — Consult Note (Addendum)
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 ?Full thickness wound of unknown origin on the lateral RLE 95% pink and 5% yellow measuring 8 x 3 x 0.2 with brown drainage on foam dressing.  ?Stage 2 pressure injury is the current status of the wounds on the right upper back and the left upper back that is 100% pink with surrounding scar tissue with scant brown drainage on foam dressings.  ?Unstageable pressure injury to the right buttock 100% yellow measuring 5 x 3 x 0.1 with brown drainage on foam dressing.  ?Full thickness wound to the left ischial tuberosity most likely related to moisture associated skin damage. 75% yellow and 25% pink measuring 1 x 3 x 0.1 with serous drainage on foam dressing. ?Pressure Injury POA: Yes ?Dressing procedure/placement/frequency: ?Stage 4 sacral PI: Apply moistened saline gauze in the wound, cover with dry gauze and ABD pads. Secure with Medipore tape. Change twice daily. ?Lateral RLE, Right and Left Upper back, Right buttock, Left ischial tuberosity: Cover with foam dressing. Assess each shift and replace every 3 days or PRN soiling. ? ?Monitor the wound area(s) for worsening of condition such as: ?Signs/symptoms of infection, increase in size, development of or worsening of odor, ?development of pain, or increased pain at the affected locations.   ?Notify the medical team if any of these develop. ? ?Thank you for the consult. [place] nurse will not follow at this time.   ?Please re-consult the [place] team if needed. ? ?Cathlean [place]se[place]. Tamala Julian, MSN, RN, CMSRN, AGCNS, WTA ?Wound Treatment Associate ?Pager [phone]   ? ? ?  ?

## 2022-01-04 NOTE — Progress Notes (Signed)
? ?NAME:  Justin Solomon, MRN:  [id number], DOB:  [birth year], LOS: 1 ?ADMISSION DATE:  01/11/2022, CONSULTATION DATE:  01/29/2022 ?REFERRING MD:  Dr. Francia Solomon - EDP CHIEF COMPLAINT:  hypotension/ abnormal labs ? ?History of Present Illness:  ?71 year old man who presented from [facility] for abnormal labs, progressive AKI and hyponatremia.  Reportedly on antibiotics at [facility] (meropenem x 7 days beginning 4/29, unclear indication). ? ?On ED arrival, patient afebrile, hypotensive with SBP 70s and normal saturations on mechanical ventilation (PRVC 16/500/5/28).  Labs significant for Na 116, K 6.9, Cl 86, bicarb 18, BUN 124, sCr 4.07 (prior 0.78), AST 68, albumin < 1.5, trop 55, LA 1.5. EKG demonstrated first degree block, rate 82.  CXR showing increased cardiomegaly and/or pericardial effusion with mild pulmonary edema, possible LLL infiltrate and small left pleural effusion; right PICC in place.  Blood cultures sent, pending UA.    ? ?Treated with 2L LR but no improvement; therefore, started on Levophed.  Hyperkalemia treated/medically shifted with insulin, D50, calcium gluconate, bicarb, Lokelma.   ? ?PCCM consulted for ICU admission. ? 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 ?Significant Hospital Events: ?Including procedures, antibiotic start and stop dates in addition to other pertinent events   ?5/2 admitted with AKI, hyperkalemia, hyponatremia ?5/3 Afib with rate 100s-120s. Ongoing hyponatremia, slight increase in WBC (21 from 15.5). Persistent AKI/ARF. ? ?Interim History / Subjective:  ?No significant events overnight ?Afib on telemetry ?Rates 100s-120s ?Slightly uptrending WBC (21 from 15.5) ?Ongoing hypoNa 116, BUN 117 ? ?Objective   ?Blood pressure (!) 98/49, pulse (!) 101, temperature (!) 97.5 ?F (36.4 ?C), temperature source Axillary, resp. rate 20, height 6'  (1.829 m), weight 104 kg, SpO2 100 %. ?   ?Vent Mode: PRVC ?FiO2 (%):  [24 %-40 %] 40 % ?Set Rate:  [6 bmp-16 bmp] 16 bmp ?Vt Set:  [500 mL] 500 mL ?PEEP:  [5 cmH20] 5 cmH20 ?Plateau Pressure:  [11 cmH20-15 cmH20] 12 cmH20  ? ?Intake/Output Summary (Last 24 hours) at 01/04/2022 0802 ?Last data filed at 01/04/2022 0700 ?Gross per 24 hour  ?Intake 2137.59 ml  ?Output 560 ml  ?Net 1577.59 ml  ? ?Filed Weights  ? 01/21/2022 1837 01/04/22 0500  ?Weight: 105 kg 104 kg  ? ?Physical Examination: ?General: Chronically ill-appearing elderly gentleman in NAD. ?HEENT: [place]/AT, anicteric sclera, PERRL, moist mucous membranes. Tracks with eyes. ?Neck: [place] Trach 7-0 in place. ?Neuro:  Awake, unable to assess orientation .  Responds to verbal stimuli. Not following commands. No spontaneous movement of extremities on exam. +Cough and +Gag  ?CV: Irregularly irregular rhythm, rate 120s, no m/g/r. ?PULM: Breathing even and unlabored on vent (PEEP 5, FiO2 30%). Lung fields with expiratory wheeze bilaterally, more prominent in upper fields. ?GI: Soft, nontender, nondistended. Normoactive bowel sounds. PEG in place without surrounding erythema. ?Extremities: Bilateral symmetric 2+ pitting UE/LE edema noted. ?Skin: Warm/dry, no rashes. ? ?Resolved Hospital Problem List:   ? ?Assessment & Plan:  ? ?Chronic respiratory failure on MV/trach/PEG ?On home vent settings without significantly worsened secretion burden. ?- Continue full vent support ?- Wean FiO2 for O2 sat > 92% ?- VAP bundle ?- Bronchodilators PRN ?- Pulmonary hygiene ? ?Undifferentiated shock, possibly septic ?Unclear etiology - possible septic shock but source unclear. Multiple potential sources - UA not yet collected, sacral decubitus, bacteremia (PICC in place). Does have cardiomegaly, was not very fluid  responsive and IVC is distended on TTE raising possibility of cardiogenic or obstructive shock. ?- Goal MAP > 65 ?- Levophed titrated to goal MAP, 68mcg at present ?- F/u Echo, r/o  cardiogenic component of shock/assess volume status ?- Trend WBC, fever curve ?- F/u Cx data ?- Continue meropenem x 48H, discontinue if Cx remain negative ?- WOC consult for pressure wounds POA ? ?Possible acute metabolic encephalopathy ?Tracks to voice for my exam which reportedly isn't far from baseline? ?- Frequent neuro exams ?- CTM response to measures addressing sepsis, AKI, hyponatremia ? ?Hyponatremia ?Wasn't very responsive to trial of volume expansion in ED under the assumption he was hypovolemic. Bedside US with distended IVC with some respiratory variation - seems euvolemic to mildly hypervolemic.  ?- Bicarb infusion ?- BMP Q4H until normalized ? ?AKI with hyperkalemia  ?Unclear if/why he was taking trimethoprim and whether that may have contributed to his AKI and hyponatremia as well. ?- Trend BMP ?- Lokelma, bicarb gtt as above ?- Replete electrolytes as indicated ?- Monitor I&Os ?- F/u urine studies, renal US ?- Avoid nephrotoxic agents as able ?- Ensure adequate renal perfusion ? ?Protein-calorie malnutrition ?- Continue TF ?- Appreciate RD/Nutrition assistance ? ?RUE swelling ?- F/u US UE Dopplers, r/o DVT ? ?AF ?- Continue home amiodarone ?- Hold Lopressor in the setting of hypotension ?- AC held in the setting of GIB (2022), assess risk/benefit of resumption ? ?HTN ?- Hold doxazosin in the setting of hypotension ?- Resume as clinically appropriate ? ?Best Practice: (right click and "Reselect all SmartList Selections" daily)  ? ?Diet/type: tubefeeds ?DVT prophylaxis: systemic heparin  ?GI prophylaxis: PPI ?Lines: yes and it is still needed ?Foley:  Yes, and it is still needed ?Code Status:  full code ?Last date of multidisciplinary goals of care discussion [Son updated at bedside, confirmed code status] ? ?Critical care time: 42 minutes  ? ?Justin Mount, PA-C ?[facility] ?01/04/22 8:02 AM ? ?Please see [email or web address] for pager details. ? ?From 7A-7P if no response, please  call [phone] ?After hours, please call ELink [phone] ?

## 2022-01-04 NOTE — Progress Notes (Signed)
eLink Physician-Brief Progress Note ?Patient Name: Justin Solomon ?DOB: Aug 19, 1951 ?MRN: [id number] ? ? ?Date of Service ? 01/04/2022  ?HPI/Events of Note ? K+ 5.5, Na+ 117  ?eICU Interventions ? Lokelma 5 gm via PEG x 1, Bicarb gtt increased to 75 ml / hour.  ? ? ? ?  ? ?Justin Solomon ?01/04/2022, 1:32 AM ?

## 2022-01-04 NOTE — Progress Notes (Signed)
Patient seen today by trach team for consult.  No education is needed at this time.  All necessary equipment is at beside.   Will continue to follow for progression. Patient is still on full ventilatory support. 

## 2022-01-04 NOTE — Progress Notes (Signed)
PIV consult: Arrived to room, Ginger RN reports PICC remains in use. No need for USGPIV at this time. RN plans to clarify with MD in the morning. Of note, B arms edematous. Peripheral access will be limited. ?

## 2022-01-05 ENCOUNTER — Inpatient Hospital Stay: Payer: Self-pay

## 2022-01-05 ENCOUNTER — Inpatient Hospital Stay (HOSPITAL_COMMUNITY): Payer: Medicare Other

## 2022-01-05 DIAGNOSIS — A419 Sepsis, unspecified organism: Secondary | ICD-10-CM | POA: Diagnosis not present

## 2022-01-05 DIAGNOSIS — R6521 Severe sepsis with septic shock: Secondary | ICD-10-CM | POA: Diagnosis not present

## 2022-01-05 DIAGNOSIS — N179 Acute kidney failure, unspecified: Secondary | ICD-10-CM | POA: Diagnosis not present

## 2022-01-05 LAB — BASIC METABOLIC PANEL
Anion gap: 12 (ref 5–15)
Anion gap: 13 (ref 5–15)
BUN: 129 mg/dL — ABNORMAL HIGH (ref 8–23)
BUN: 131 mg/dL — ABNORMAL HIGH (ref 8–23)
CO2: 22 mmol/L (ref 22–32)
CO2: 21 mmol/L — ABNORMAL LOW (ref 22–32)
Calcium: 8 mg/dL — ABNORMAL LOW (ref 8.9–10.3)
Calcium: 8.1 mg/dL — ABNORMAL LOW (ref 8.9–10.3)
Chloride: 87 mmol/L — ABNORMAL LOW (ref 98–111)
Chloride: 87 mmol/L — ABNORMAL LOW (ref 98–111)
Creatinine, Ser: 4.13 mg/dL — ABNORMAL HIGH (ref 0.61–1.24)
Creatinine, Ser: 4.19 mg/dL — ABNORMAL HIGH (ref 0.61–1.24)
GFR, Estimated: 14 mL/min — ABNORMAL LOW (ref >60)
GFR, Estimated: 14 mL/min — ABNORMAL LOW (ref >60)
Glucose, Bld: 129 mg/dL — ABNORMAL HIGH (ref 70–99)
Glucose, Bld: 122 mg/dL — ABNORMAL HIGH (ref 70–99)
Potassium: 5.1 mmol/L (ref 3.5–5.1)
Potassium: 5.2 mmol/L — ABNORMAL HIGH (ref 3.5–5.1)
Sodium: 121 mmol/L — ABNORMAL LOW (ref 135–145)
Sodium: 121 mmol/L — ABNORMAL LOW (ref 135–145)

## 2022-01-05 LAB — CBC
HCT: 22.8 % — ABNORMAL LOW (ref 39.0–52.0)
Hemoglobin: 7.4 g/dL — ABNORMAL LOW (ref 13.0–17.0)
MCH: 28.2 pg (ref 26.0–34.0)
MCHC: 32.5 g/dL (ref 30.0–36.0)
MCV: 87 fL (ref 80.0–100.0)
Platelets: 107 10*3/uL — ABNORMAL LOW (ref 150–400)
RBC: 2.62 MIL/uL — ABNORMAL LOW (ref 4.22–5.81)
RDW: 15.1 % (ref 11.5–15.5)
WBC: 15.6 10*3/uL — ABNORMAL HIGH (ref 4.0–10.5)
nRBC: 0 % (ref 0.0–0.2)

## 2022-01-05 LAB — GLUCOSE, CAPILLARY
Glucose-Capillary: 128 mg/dL — ABNORMAL HIGH (ref 70–99)
Glucose-Capillary: 133 mg/dL — ABNORMAL HIGH (ref 70–99)
Glucose-Capillary: 144 mg/dL — ABNORMAL HIGH (ref 70–99)
Glucose-Capillary: 149 mg/dL — ABNORMAL HIGH (ref 70–99)
Glucose-Capillary: 151 mg/dL — ABNORMAL HIGH (ref 70–99)
Glucose-Capillary: 163 mg/dL — ABNORMAL HIGH (ref 70–99)

## 2022-01-05 LAB — MAGNESIUM: Magnesium: 2.1 mg/dL (ref 1.7–2.4)

## 2022-01-05 LAB — HEMOGLOBIN AND HEMATOCRIT, BLOOD
HCT: 20 % — ABNORMAL LOW (ref 39.0–52.0)
Hemoglobin: 6.5 g/dL — CL (ref 13.0–17.0)

## 2022-01-05 LAB — URINE CULTURE: Culture: >=100000 — AB

## 2022-01-05 LAB — PREPARE RBC (CROSSMATCH)

## 2022-01-05 MED ORDER — PANTOPRAZOLE SODIUM 40 MG IV SOLR
40.0000 mg | Freq: Two times a day (BID) | INTRAVENOUS | Status: DC
  Administered 2022-01-05 – 2022-01-08 (×7): 40 mg via INTRAVENOUS
  Filled 2022-01-05 (×7): qty 10

## 2022-01-05 MED ORDER — SODIUM CHLORIDE 0.9 % IV SOLN
20.0000 ug | Freq: Once | INTRAVENOUS | Status: AC
  Administered 2022-01-05: 20 ug via INTRAVENOUS
  Filled 2022-01-05: qty 5

## 2022-01-05 MED ORDER — SODIUM ZIRCONIUM CYCLOSILICATE 10 G PO PACK
10.0000 g | PACK | Freq: Every day | ORAL | Status: DC
  Administered 2022-01-05 – 2022-01-08 (×4): 10 g
  Filled 2022-01-05 (×5): qty 1

## 2022-01-05 MED ORDER — FUROSEMIDE 10 MG/ML IJ SOLN
60.0000 mg | Freq: Once | INTRAMUSCULAR | Status: AC
Start: 2022-01-05 — End: 2022-01-05
  Administered 2022-01-05: 60 mg via INTRAVENOUS
  Filled 2022-01-05: qty 6

## 2022-01-05 MED ORDER — SODIUM CHLORIDE 0.9% IV SOLUTION: Freq: Once | INTRAVENOUS | Status: AC

## 2022-01-05 MED ORDER — SODIUM CHLORIDE 0.9 % IV BOLUS
1000.0000 mL | Freq: Once | INTRAVENOUS | Status: AC
  Administered 2022-01-05: 1000 mL via INTRAVENOUS

## 2022-01-05 MED ORDER — SODIUM CHLORIDE 0.9 % IV SOLN
500.0000 mg | Freq: Two times a day (BID) | INTRAVENOUS | Status: DC
  Administered 2022-01-05 – 2022-01-07 (×5): 500 mg via INTRAVENOUS
  Filled 2022-01-05 (×7): qty 10

## 2022-01-05 MED ORDER — ALTEPLASE 2 MG IJ SOLR: 6.0000 mg | INTRAMUSCULAR | Status: AC

## 2022-01-05 MED ORDER — ALTEPLASE 2 MG IJ SOLR: 6.0000 mg | Freq: Once | INTRAMUSCULAR | Status: DC

## 2022-01-05 NOTE — TOC Progression Note (Addendum)
Transition of Care (TOC) - Initial/Assessment Note  ? ? ?Patient Details  ?Name: Justin Solomon ?MRN: [id number] ?Date of Birth: 12/28/1950 ? ?Transition of Care (TOC) CM/SW Contact:    ?Paulene Floor Phila Shoaf, LCSWA ?Phone Number: ?01/05/2022, 11:03 AM ? ?Clinical Narrative:                 ?CSW contacted Levada Dy with admissions at [facility] to confirm that the patient is a resident there and to inquire about what is needed for the patient to return when medically ready.  CSW is awaiting a response.   ? ?14:00-  CSW verified that the patient is from [facility] and can return when medically ready. ?  ?  ? ? ?Patient Goals and CMS Choice ?  ?  ?  ? ?Expected Discharge Plan and Services ?  ?  ?  ?  ?  ?                ?  ?  ?  ?  ?  ?  ?  ?  ?  ?  ? ?Prior Living Arrangements/Services ?  ?  ?  ?       ?  ?  ?  ?  ? ?Activities of Daily Living ?  ?  ? ?Permission Sought/Granted ?  ?  ?   ?   ?   ?   ? ?Emotional Assessment ?  ?  ?  ?  ?  ?  ? ?Admission diagnosis:  Hyponatremia [E87.1] ?AKI (acute kidney injury) ([place]) [N17.9] ?Sepsis ([place]) [A41.9] ?Hypotension, unspecified hypotension type [I95.9] ?Patient Active Problem List  ? Diagnosis Date Noted  ? Malnutrition of moderate degree 01/04/2022  ? Sepsis ([place]) 01/06/2022  ? 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 Cerebral thrombosis with cerebral infarction 02/10/2021  ? Pneumonia due to methicillin resistant Staphylococcus aureus (MRSA) ([place])   ? Encephalopathy   ? Acute on chronic respiratory failure with hypoxia ([place]) 02/06/2021  ? Pressure injury of skin 02/03/2021  ? Rectal bleeding 02/02/2021  ? Quadriparesis ([place]) 02/02/2021  ? Atrial fibrillation ([place]) 02/02/2021  ? ARF (acute renal failure) ([place]) 02/02/2021  ? Essential hypertension 02/02/2021  ? Acute blood loss anemia 02/02/2021  ? Acute bleeding 02/02/2021  ? Hemorrhagic shock ([place])   ? ?PCP:  Aaron Edelman, MD ?Pharmacy:  No Pharmacies Listed ? ? ? ?Social Determinants of Health (SDOH) Interventions ?   ? ?Readmission Risk Interventions ?   ? View : No data to display.  ?  ?  ?  ? ? ? ?

## 2022-01-05 NOTE — Progress Notes (Signed)
? ?NAME:  Justin Solomon, MRN:  [id number], DOB:  07/03/1951, LOS: 2 ?ADMISSION DATE:  01/13/2022, CONSULTATION DATE:  01/16/2022 ?REFERRING MD:  Dr. Francia Greaves - EDP CHIEF COMPLAINT: Hypotension/abnormal labs ? ?History of Present Illness:  ?71 year old man who presented from [facility] for abnormal labs, progressive AKI and hyponatremia.  Reportedly on antibiotics at [facility] (meropenem x 7 days beginning 4/29, unclear indication). ? ?On ED arrival, patient afebrile, hypotensive with SBP 70s and normal saturations on mechanical ventilation (PRVC 16/500/5/28).  Labs significant for Na 116, K 6.9, Cl 86, bicarb 18, BUN 124, sCr 4.07 (prior 0.78), AST 68, albumin < 1.5, trop 55, LA 1.5. EKG demonstrated first degree block, rate 82.  CXR showing increased cardiomegaly and/or pericardial effusion with mild pulmonary edema, possible LLL infiltrate and small left pleural effusion; right PICC in place.  Blood cultures sent, pending UA.    ? ?Treated with 2L LR but no improvement; therefore, started on Levophed.  Hyperkalemia treated/medically shifted with insulin, D50, calcium gluconate, bicarb, Lokelma.   ? ?PCCM consulted for ICU admission. ? 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 ?Significant Hospital Events: ?Including procedures, antibiotic start and stop dates in addition to other pertinent events   ?5/2 admitted with AKI, hyperkalemia, hyponatremia ?5/3 Afib with rate 100s-120s. Ongoing hyponatremia, slight increase in WBC (21 from 15.5). Persistent AKI/ARF. RUE DVT, PICC associated. Echo with large pericardial effusion, not a candidate for intervention per Cards. ?5/4 Melena overnight/AM. PPI BID.  ? ?Interim History / Subjective:  ?Melena noted overnight/early AM 5/4 ?Leaking around existing chronic rectal tube (removed) ?WOCN consult for pressure wounds, POA ?DDAVP x  1 ?RUE DVT (PICC-associated), will removed once alternate access obtained ?Echo with large pericardial effusion, not a candidate for intervention ? ?Objective:  ?Blood pressure (!) 113/54, pulse (!) 101, temperature (!) 96.9 ?F (36.1 ?C), temperature source Axillary, resp. rate (!) 24, height 6' (1.829 m), weight 104 kg, SpO2 94 %. ?   ?Vent Mode: PRVC ?FiO2 (%):  [40 %] 40 % ?Set Rate:  [16 bmp] 16 bmp ?Vt Set:  [500 mL] 500 mL ?PEEP:  [5 cmH20] 5 cmH20 ?Plateau Pressure:  [14 cmH20-20 cmH20] 20 cmH20  ? ?Intake/Output Summary (Last 24 hours) at 01/05/2022 0754 ?Last data filed at 01/05/2022 0600 ?Gross per 24 hour  ?Intake 1082.41 ml  ?Output 990 ml  ?Net 92.41 ml  ? ? ?Filed Weights  ? 01/26/2022 1837 01/04/22 0500  ?Weight: 105 kg 104 kg  ? ?Physical Examination: ?General: Chronically ill-appearing elderly man in NAD. ?HEENT: [place]/AT, anicteric sclera, PERRL, dry mucous membranes. ?Neuro:  Awake, unable to assess orientation.  Tracks with eyes to verbal stimuli. Not following commands. No spontaneous movement of extremities 2/2 quadriplegia. +Cough and +Gag  ?CV: RRR, no m/g/r. ?PULM: Breathing even and unlabored on vent (PEEP 5, FiO2 40%). Lung fields coarse throughout. ?GI: Soft, nontender, nondistended. Hypoactive bowel sounds. PEG in place without surrounding erythema. Melena per rectum. ?Extremities: Bilateral symmetric 1+ nonpitting LE edema noted. ?Skin: Warm/dry, no rashes. ? ?Resolved Hospital Problem List:   ? ?Assessment & Plan:  ? ?Chronic respiratory failure on MV/trach/PEG ?On home vent settings without significantly worsened secretion burden. ?- Continue full vent support ?- Wean FiO2 for O2 sat > 92% ?- VAP bundle ?- Bronchodilators PRN ?- Pulmonary hygiene ? ?Undifferentiated shock, possibly septic ?Unclear etiology - possible septic shock but  source unclear. Multiple potential sources - UA not yet collected, sacral decubitus, bacteremia (PICC in place). Does have cardiomegaly, was not very fluid  responsive and IVC is distended on TTE raising possibility of cardiogenic or obstructive shock. ?- Goal MAP > 65 ?- Levophed titrated to goal MAP, 59mcg at present ?- Trend WBC, fever curve ?- F/u Cx data ?- Continue meropenem x 48H, discontinue if Cx remain negative ?- WOC consult for pressure wounds (POA)  ? ?Possible acute metabolic encephalopathy ?Tracks to voice for my exam which reportedly isn't far from baseline? ?- Frequent neuro exams ? ?Melena ?Concern of GIB ?- PPI BID ?- Consider GI consult if worsening, though unsure if patient would be a candidate for intervention ?- Hold AC (exception of SQH) ?- S/p DDAVP x 1 5/4 ? ?Hyponatremia ?Uremia ?Wasn't very responsive to trial of volume expansion in ED under the assumption he was hypovolemic. Bedside US with distended IVC with some respiratory variation - seems euvolemic to mildly hypervolemic.  ?- Bicarb infusion discontinued ?- Trend Na, normalizing ?- S/p DDAVP  x 1 in the setting of elevated BUN ? ?AKI with hyperkalemia  ?Unclear if/why he was taking trimethoprim and whether that may have contributed to his AKI and hyponatremia as well. ?- Trend BMP ?- Lokelma, bicarb gtt discontinued ?- Replete electrolytes as indicated ?- Monitor I&Os ?- Avoid nephrotoxic agents as able ?- Ensure adequate renal perfusion ? ?Protein-calorie malnutrition ?- Continue TF ?- Appreciate RD/Nutrition assistance ? 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 ?AF ?- Continue home amiodarone ?- Hold Lopressor in the setting of hypotension ?- AC held in the setting on active melena ? ?HTN ?- Hold doxazosin in the setting of hypotension, resume as clinically appropriate ? ?Best Practice: (right click and "Reselect all SmartList Selections" daily)  ? ?Diet/type: tubefeeds ?DVT prophylaxis: systemic heparin  ?GI prophylaxis: PPI ?Lines: yes and it is still  needed ?Foley:  Yes, and it is still needed ?Code Status:  full code ?Last date of multidisciplinary goals of care discussion [Son updated at bedside, confirmed code status] ? ?Critical care time: 39 minutes  ? ?Lestine Mount, PA-C ?[facility] ?01/05/22 7:54 AM ? ?Please see [email or web address] for pager details. ? ?From 7A-7P if no response, please call [phone] ?After hours, please call ELink [phone] ?

## 2022-01-05 NOTE — Progress Notes (Addendum)
?Cardiology Progress Note  ?Patient ID: Justin Solomon ?MRN: [id number] ?DOB: 1950-10-27 ?Date of Encounter: 01/05/2022 ? ?Primary Cardiologist: None ? ?Subjective  ? ?Chief Complaint: none.  ? ?HPI: Sodium remains low.  BUN still elevated.  Kidney function not improving.  Now with gram-positive rods.  Blood pressure soft.  Still on pressors.  Low diastolic pressure suggests sepsis.  Hemoglobin with low values requiring transfusion. ? ?ROS:  ?All other ROS reviewed and negative. Pertinent positives noted in the HPI.    ? ?Inpatient Medications  ?Scheduled Meds: ? amiodarone  200 mg Per Tube Daily  ? vitamin C  250 mg Per Tube BID  ? chlorhexidine gluconate (MEDLINE KIT)  15 mL Mouth Rinse BID  ? Chlorhexidine Gluconate Cloth  6 each Topical Daily  ? feeding supplement (PROSource TF)  45 mL Per Tube QID  ? ferrous sulfate  300 mg Per Tube Daily  ? fiber  1 packet Per Tube Daily  ? heparin  5,000 Units Subcutaneous Q8H  ? mouth rinse  15 mL Mouth Rinse 10 times per day  ? multivitamin with minerals  1 tablet Per Tube Daily  ? mupirocin ointment  1 application. Nasal BID  ? pantoprazole sodium  40 mg Per Tube Daily  ? sodium chloride flush  10-40 mL Intracatheter Q12H  ? zinc sulfate  220 mg Per Tube BID  ? ?Continuous Infusions: ? sodium chloride Stopped (01/04/22 1025)  ? desmopressin (DDAVP) IV for Bleeding    ? feeding supplement (OSMOLITE 1.5 CAL) 1,000 mL (01/04/22 2236)  ? meropenem (MERREM) IV Stopped (01/04/22 2314)  ? norepinephrine (LEVOPHED) Adult infusion 4 mcg/min (01/05/22 0300)  ? sodium chloride    ? ?PRN Meds: ?acetaminophen, sodium chloride flush  ? ?Vital Signs  ? ?Vitals:  ? 01/05/22 0300 01/05/22 0342 01/05/22 0344 01/05/22 0728  ?BP: (!) 113/54     ?Pulse: (!) 101  (!) 101   ?Resp: (!) 26  (!) 24   ?Temp:  (!) 96.9 ?F (36.1 ?C)    ?TempSrc:  Axillary  Oral  ?SpO2: 93%  94%   ?Weight:      ?Height:      ? ? ?Intake/Output Summary (Last 24 hours) at 01/05/2022 0828 ?Last data filed at 01/05/2022  0600 ?Gross per 24 hour  ?Intake 1018.35 ml  ?Output 990 ml  ?Net 28.35 ml  ? ? ?  01/04/2022  ?  5:00 AM 01/02/2022  ?  6:37 PM 04/16/2021  ?  8:56 PM  ?Last 3 Weights  ?Weight (lbs) 229 lb 4.5 oz 231 lb 7.7 oz 220 lb  ?Weight (kg) 104 kg 105 kg 99.791 kg  ?   ? ?Telemetry  ?Overnight telemetry shows ST 100s, which I personally reviewed.  ? ? ?Physical Exam  ? ?Vitals:  ? 01/05/22 0300 01/05/22 0342 01/05/22 0344 01/05/22 0728  ?BP: (!) 113/54     ?Pulse: (!) 101  (!) 101   ?Resp: (!) 26  (!) 24   ?Temp:  (!) 96.9 ?F (36.1 ?C)    ?TempSrc:  Axillary  Oral  ?SpO2: 93%  94%   ?Weight:      ?Height:      ?  ?Intake/Output Summary (Last 24 hours) at 01/05/2022 0828 ?Last data filed at 01/05/2022 0600 ?Gross per 24 hour  ?Intake 1018.35 ml  ?Output 990 ml  ?Net 28.35 ml  ?  ? ?  01/04/2022  ?  5:00 AM 01/18/2022  ?  6:37 PM 04/16/2021  ?  8:56 PM  ?Last 3 Weights  ?Weight (lbs) 229 lb 4.5 oz 231 lb 7.7 oz 220 lb  ?Weight (kg) 104 kg 105 kg 99.791 kg  ?  Body mass index is 31.1 kg/m?.  ?General: Ill-appearing ?Head: Atraumatic, normal size  ?Eyes: PEERLA, EOMI  ?Neck: Supple, no JVD ?Endocrine: No thryomegaly ?Cardiac: Tachycardia, systolic ejection murmur ?Lungs: Diminished breath sound bilaterally ?Abd: Soft, nontender, no hepatomegaly  ?Ext: Edema noted ?Musculoskeletal: No deformities ?Skin: Warm and dry, no rashes   ?Neuro: Response to pain, will not follow commands ? ?Labs  ?High Sensitivity Troponin:   ?Recent Labs  ?Lab 01/31/2022 ?1436 01/18/2022 ?1600  ?TROPONINIHS 55* 57*  ?   ?Cardiac EnzymesNo results for input(s): TROPONINI in the last 168 hours. No results for input(s): TROPIPOC in the last 168 hours.  ?Chemistry ?Recent Labs  ?Lab 01/28/2022 ?1436 01/12/2022 ?1721 01/04/22 ?0325 01/04/22 ?1901 01/05/22 ?3546  ?NA 116*   < > 116* 118* 121*  ?K 6.9*   < > 5.3* 5.0 5.1  ?CL 86*   < > 86* 86* 87*  ?CO2 18*   < > 20* 21* 22  ?GLUCOSE 113*   < > 145* 149* 129*  ?BUN 124*   < > 117* 124* 129*  ?CREATININE 4.07*   < > 4.06* 4.01*  4.13*  ?CALCIUM 8.2*   < > 8.2* 7.8* 8.0*  ?PROT 7.1  --   --   --   --   ?ALBUMIN <1.5*  --   --   --   --   ?AST 68*  --   --   --   --   ?ALT 41  --   --   --   --   ?ALKPHOS 113  --   --   --   --   ?BILITOT 0.8  --   --   --   --   ?GFRNONAA 15*   < > 15* 15* 14*  ?ANIONGAP 12   < > _0 ? < > = values in this interval not displayed.  ?  ?Hematology ?Recent Labs  ?Lab 01/28/2022 ?1721 01/16/2022 ?1740 01/04/22 ?0325  ?WBC  --  15.5* 21.0*  ?RBC  --  2.41* 2.99*  ?HGB 7.1* 6.8* 8.5*  ?HCT 21.0* 20.8* 25.6*  ?MCV  --  86.3 85.6  ?MCH  --  28.2 28.4  ?MCHC  --  32.7 33.2  ?RDW  --  15.1 14.9  ?PLT  --  92* 97*  ? ?BNP ?Recent Labs  ?Lab 01/04/2022 ?1600  ?BNP 149.0*  ?  ?DDimer No results for input(s): DDIMER in the last 168 hours.  ? ?Radiology  ?US RENAL ? ?Result Date: 01/06/2022 ?CLINICAL DATA:  Acute kidney injury. EXAM: RENAL / URINARY TRACT ULTRASOUND COMPLETE COMPARISON:  None Available. FINDINGS: Right Kidney: Renal measurements: 12.6 x 5.8 x 5.2 cm = volume: 200 mL. Echogenicity within normal limits. There is no hydronephrosis. There is a 1 cm echogenic focus in the superior pole the right kidney, possibly calcification. Left Kidney: Renal measurements: 12.0 x 6.4 x 6.8 cm = volume: 273 mL. Echogenicity within normal limits. No mass or hydronephrosis visualized. Bladder: Under distended and not well evaluated. Other: Small amount of free fluid in the right upper quadrant. IMPRESSION: 1. No evidence for hydronephrosis. 2. Questionable right renal calculus. 3. Bladder not well evaluated. 4. Small amount of ascites in the right upper quadrant. Electronically Signed   By: Tina Griffiths.D.  On: 01/02/2022 23:17  ? ?DG Chest Port 1 View ? ?Result Date: 01/04/2022 ?CLINICAL DATA:  Acute on chronic hypoxic respiratory failure EXAM: PORTABLE CHEST 1 VIEW COMPARISON:  Chest radiograph 1 day prior FINDINGS: The tracheostomy tube appears grossly stable. The right upper extremity PICC is stable terminating in the mid  to lower SVC. The cardiac silhouette is significantly enlarged, unchanged. The upper mediastinal contours are prominent, unchanged. There is confluent retrocardiac opacity likely reflecting combination of pleural effusion and adjacent airspace opacity, not significantly changed. The left upper lung remains aerated. The right lung is clear with no new or worsening focal airspace disease. There is no significant right effusion. There is no pneumothorax. There is no acute osseous abnormality. IMPRESSION: 1. Unchanged retrocardiac opacity likely reflecting a pleural effusion and adjacent airspace opacity. No new or worsening focal airspace disease. 2. Unchanged enlarged cardiac silhouette. Pericardial effusion again not excluded. Electronically Signed   By: Valetta Mole M.D.   On: 01/04/2022 08:30  ? ?DG Chest Port 1 View ? ?Result Date: 01/29/2022 ?CLINICAL DATA:  Ventilator dependent. Weakness and abnormal renal function. History of hypertension, atrial fibrillation and stroke. EXAM: PORTABLE CHEST 1 VIEW COMPARISON:  Radiographs 04/16/2021 and 03/19/2021. FINDINGS: 1420 hours. Interval increased size of the cardiac silhouette consistent with cardiomegaly or a pericardial effusion. Tracheostomy and right arm PICC appear adequately positioned. There is increased vascular congestion with possible mild edema. There is new left lower lobe airspace disease and a probable small left pleural effusion. Probable heterotopic ossification surrounding the left shoulder. No acute osseous findings are evident. Telemetry leads overlie the chest. IMPRESSION: Interval development of cardiomegaly and/or a pericardial effusion with mild pulmonary edema, left lower lobe airspace disease and a probable small left pleural effusion. Cannot exclude pneumonia. Radiographic follow up recommended. Electronically Signed   By: Richardean Sale M.D.   On: 01/13/2022 14:32  ? ?ECHOCARDIOGRAM COMPLETE ? ?Result Date: 01/04/2022 ?   ECHOCARDIOGRAM REPORT    Patient Name:   Justin Solomon Date of Exam: 01/04/2022 Medical Rec #:  [id number]      Height:       72.0 in Accession #:    [id number]     Weight:       229.3 lb Date of Birth:  07/01/1951       BSA:          2.258 m

## 2022-01-05 NOTE — Progress Notes (Signed)
eLink Physician-Brief Progress Note ?Patient Name: FORSTER PAMER ?DOB: 1950-10-20 ?MRN: [id number] ? ? ?Date of Service ? 01/05/2022  ?HPI/Events of Note ? Review of CXR done 01/05/2022 done at 7:09 PM reveals enlargement of cardiac silhouette with pulmonary vascular congestion and increased pulmonary edema. Coexistent small RIGHT pleural effusion and atelectasis versus consolidation LEFT lower lobe. Creatinine = 4.19.   ?eICU Interventions ? Plan: ?Lasix 60 mg IV X 1 now.   ? ? ? ?Intervention Category ?Major Interventions: Hypoxemia - evaluation and management ? ?Finnley Larusso Cornelia Copa ?01/05/2022, 9:49 PM ?

## 2022-01-05 NOTE — Consult Note (Addendum)
? ? ? ? ? Consultation ? ?Referring Provider:   CCM ?Primary Care Physician:  Aaron Edelman, MD ?Primary Gastroenterologist:  Althia Forts  ( Osborne Oman, saw [facility] 2022 in patient) ?Reason for Consultation:     Melena ? ? Impression   ? ?71 year old male quadriplegic chronic trach and PEG tube presented from [facility] with AMS, AKI and hyponatremia, currently requiring Levophed for hypotension, 1 g drop of hemoglobin with melena reported, now with the nurse reporting bright to dark red blood.  ? ?Acute on chronic anemia with hypotension requiring Levophed ?Baseline appears to be 7-8 since early 2022, admitted 6.8 1:02 blood transfusion increased to 8.5 then down To 7.4 with some melena ?HGB 8.1 up from 6.5 after 2 units PRBs ?MCV 87.0 Platelets 107 ? ?Melena/BRB with hypotension ?HGB 6.5 MCV 87.0 Platelets 107 ?BUN 131 Cr 4.19 ?02/2021 endoscopy grade a esophagitis, gastritis, duodenitis with nonbleeding duodenal ulcers. ?02/01/21 CT abdomen without contrast : Large volume of stool throughout the colon suggestive of constipation. Moderate to marked rectal distension by fecal material with mild rectal wall thickening and suggestion of mild perirectal inflammation, question proctitis or early changes of stercoral colitis ?Outpatient on Nexium only, recently switched to PPI twice daily. ? ?Hypotension of unclear etiology ?On Levophed between 3-4 mics. ?On meropenem with leukocyotis ?Normal cortisol ?CBC on 01/05/2022   ?WBC 15.6  ? ?AKI ?BUN 131 Cr 4.19  ?GFR 14  ?Potassium 5.2  Magnesium 2.1  ? ?Chronic respiratory failure on mechanical vent/trach/PEG ?CCM following. ? ?moderate protein calorie malnutrition ?Albumin 01/25/2022  <1.5  ?BMI body mass index is 32.17 kg/m?Marland Kitchen  ?Secondary to Prolonged hospital stay ?PEG tube in place on TF ? ?Atrial fibrillation ?On amiodarone, not on anticoagulation presents ? ?Right upper extremity DVT ?Status PICC line no AC at the present. ? ? Plan  ? ? ?-Patient with chronic anemia  appears to have a baseline around 7-8, after 1 blood transfusion increased to 8.5 and then drifted down to 6.8 with some melena now at 8.5 after 2 units.  ?-Very possible ICU and/stress-induced ulcers. ?-Patient also has chronic rectal tube in place, this was removed and found to have pressure ulcers, wound care consulted. ?-Patient had previous possible proctitis/sterocoral ulcer on CT in 2022 can consider hydrocortisone suppositories to aid in healing, consider CT abdomen pelvis/KUB. ? ?-Patient very high risk for endoscopic procedures, also very uremic right now and on Levophed. ?-Agree with supportive care at this time, patient continues to be transfusion dependent can consider endoscopic evaluation. ?-Protonix 40 mg IV BID, can also add on Carafate via PEG tube 3 times daily. ?--Continue to monitor H&H with transfusion as needed to maintain hemoglobin greater than 7. ?-Would suggest goals of care discussion with family. ? ? ?Thank you for your kind consultation, we will continue to follow. ?       ? HPI:   ?Justin Solomon is a 71 y.o. male with past medical history significant for CVA , cervical fx/epidural hematoma 2/2 resulting in quadriplegic s/p chronic tracheostomy on mechanical ventilation and PEG tube, atrial fibrillation not on anticoagulation coming from [facility] care for AMS and progressive AKI on 01/06/2022. ? ?Patient previously seen by [facility] 02/2021 admission for hematochezia had poor preparation for flex sig at that time supposed to have repeat with 2 enemas however this was not done.  Upper endoscopy at that time did show grade a esophagitis, gastritis and bleeding superficial duodenal ulcers.  ? ?Presented to the ER from [facility] with progressive AKI  and hyponatremia on antibiotics at that time for unknown reason. ?Hypotension and started on Levophed,  currently on 3-4 mics  ?Normal lactic acid.  INR 1.1 ?Patient had negative C. difficile. ?Urine culture showed yeast ?Troponin mildly elevated  57, NP 149 ?Admitting sodium 116 most recent 09/05/2019, potassium 6.9 currently at 5.2,  ?BUN admitting 124 currently 131 with creatinine 4.07 4.19 patient very uremic.  GFR 15 ?AST 68, ALT 41 ?Echocardiogram large pericardial effusion not a candidate for intervention. ? ?01/07/2019 3 GI consulted for 1 g drop of hemoglobin throughout the day with melena. ?Melena first reported 05/04 early in the morning.  Leaking around existing chronic rectal tube which was removed revealing pressure ulcers. ?Patient's baseline hemoglobin appears to be around 7 or 8. ?01/06/2022 hemoglobin 6.8 patient had 1 blood transfusion, increased to 8.5 01/04/2022 currently at 7.4. ?Outpatient at [place] was on Nexium once daily, most recently here was getting once daily PPI through PEG tube. ?Switch to pantoprazole twice daily IV 01/05/2022. ? ?Patient unable to track or respond. ?Lying in bed on ventilator. ?Discussed with nurse states had 1 bowel movement with her moderate bright red to dark blood, per nurses notes another large red volume around 10 last night.Marland Kitchen   ?Per morning nurse appears to be more blood rather than stool. ? ? ?Abnormal ED labs: ? ?02/02/2021 EGD and colon admission for hematochezia with Dr. Alessandra Bevels ?Upper endoscopy Grade a esophagitis, gastritis, and bleeding superficial duodenal ulcers no biopsies ?Fleck sigmoidoscopy Poor preparation, stool in rectum and sigmoid: Recommended repeat sigmoidoscopy after giving 2 Fleet enemas.  But I do not see where this was ever done. ?02/01/21 CT abdomen without contrast : Large volume of stool throughout the colon suggestive of constipation. Moderate to marked rectal distension by fecal material with mild rectal wall thickening and suggestion of mild perirectal inflammation, question proctitis or early changes of stercoral colitis ?EGD with PEG placement 12/29/20 ? ?Past Medical History:  ?Diagnosis Date  ? Acute respiratory failure ([place])   ? Atrial fibrillation ([place])   ? Essential  hypertension i  ? 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 Quadraplegic  ? ? ?Surgical History:  ?He  has a past surgical history that includes Tracheostomy; PEG tube placement; Flexible sigmoidoscopy (N/A, 02/02/2021); and Esophagogastroduodenoscopy (N/A, 02/02/2021). ?Family History:  ?His family history is not on file. ?Social History:  ? reports that he has quit smoking. He has never used smokeless tobacco. He reports that he does not currently use alcohol. He reports that he does not currently use drugs. ? ?Prior to Admission medications   ?Medication Sig Start Date End Date Taking? Authorizing Provider  ?acetaminophen (TYLENOL) 325 MG tablet Place 2 tablets (650 mg total) into feeding tube every 6 (six) hours as needed for mild pain (or Fever >/= 101). 02/15/21  Yes Estill Cotta, NP  ?Amino Acids-Protein Hydrolys (FEEDING SUPPLEMENT, PRO-STAT SUGAR FREE 64,) LIQD Place 30 mLs into feeding tube 2 (two) times daily.   Yes [provider]  ?amiodarone (PACERONE) 200 MG tablet Place 200 mg into feeding tube daily. 12/30/20  Yes [provider]  ?doxazosin (CARDURA) 2 MG tablet Place 2 mg into feeding tube daily.   Yes [provider]  ?esomeprazole (NEXIUM) 40 MG packet 40 mg daily before breakfast. Per tube   Yes [provider]  ?ferrous sulfate 300 (60 Fe) MG/5ML syrup Place 300 mg into feeding tube daily.   Yes [provider]  ?fiber (NUTRISOURCE FIBER) PACK packet Place  1 packet into feeding tube in the morning, at noon, and at bedtime.   Yes [provider]  ?Lidocaine, Anorectal, 5 % CREA Apply 1 application. topically every 12 (twelve) hours as needed (pain).   Yes [provider]  ?meropenem 1 g in sodium chloride 0.9 % 100 mL Inject 1 g into the vein every 8 (eight) hours. 7 day course   Yes [provider]  ?metoprolol tartrate (LOPRESSOR) 25 MG tablet Place 25 mg into feeding tube 2 (two) times daily.   Yes [provider]   ?Multiple Vitamin (MULTIVITAMIN) tablet Place 1 tablet into feeding tube daily.   Yes [provider]  ?Nutritional Supplements (ARGINAID) PACK Place 1 packet into feeding tube 2 (two) times

## 2022-01-05 NOTE — Progress Notes (Signed)
Peripherally Inserted Central Catheter Placement ? ?The IV Nurse has discussed with the patient and/or persons authorized to consent for the patient, the purpose of this procedure and the potential benefits and risks involved with this procedure.  The benefits include less needle sticks, lab draws from the catheter, and the patient may be discharged home with the catheter. Risks include, but not limited to, infection, bleeding, blood clot (thrombus formation), and puncture of an artery; nerve damage and irregular heartbeat and possibility to perform a PICC exchange if needed/ordered by physician.  Alternatives to this procedure were also discussed.  Bard Power PICC patient education guide, fact sheet on infection prevention and patient information card has been provided to patient /or left at bedside.   ? ?PICC Placement Documentation  ?PICC Double Lumen 01/05/22 Left Brachial 45 cm 0 cm (Active)  ?Indication for Insertion or Continuance of Line Vasoactive infusions 01/05/22 1800  ?Exposed Catheter (cm) 0 cm 01/05/22 1800  ?Site Assessment Clean, Dry, Intact 01/05/22 1800  ?Lumen #1 Status Flushed;Blood return noted;Saline locked 01/05/22 1800  ?Lumen #2 Status Flushed;Blood return noted;Saline locked 01/05/22 1800  ?Dressing Type Transparent;Securing device 01/05/22 1800  ?Dressing Status Clean, Dry, Intact;Antimicrobial disc in place 01/05/22 1800  ?Dressing Change Due 01/12/22 01/05/22 1800  ?   ?PICC Triple Lumen [id number] Right Basilic (Active)  ?Indication for Insertion or Continuance of Line Chronic illness with exacerbations (CF, Sickle Cell, etc.);Prolonged intravenous therapies 01/05/22 0800  ?Exposed Catheter (cm) 4 cm 01/07/2022 1600  ?Site Assessment Clean, Dry, Intact 01/05/22 0800  ?Lumen #1 Status Infusing 01/05/22 0800  ?Lumen #2 Status Occluded 01/05/22 0800  ?Lumen #3 Status Occluded 01/05/22 0800  ?Dressing Type Securing device 01/05/22 0800  ?Dressing Status Antimicrobial disc in place;Clean, Dry,  Intact 01/05/22 0800  ?Safety Lock Placed 01/07/2022 1558  ?Line Care Lumen 1 cap changed;Lumen 2 cap changed;Lumen 3 cap changed;Connections checked and tightened 01/26/2022 1600  ?Line Adjustment (NICU/IV Team Only) No 01/16/2022 1600  ?Dressing Change Due 01/10/22 01/05/22 0800  ? ? ? ? ? ?Jule Economy Horton ?01/05/2022, 6:57 PM ? ?

## 2022-01-05 NOTE — Progress Notes (Signed)
PHARMACY - PHYSICIAN COMMUNICATION ?CRITICAL VALUE ALERT - BLOOD CULTURE IDENTIFICATION (BCID) ? ?[name] is an 71 y.o. male who presented to [facility] on Jan 13, 2022 with a chief complaint of abnormal labs/chronic respiratory failure ? ?5/2 Blood>>Gram positive rods ? ?Name of physician (or Provider) Contacted: Dr. Vladimir Faster  ? ?Current antibiotics: Merrem ? ?Changes to prescribed antibiotics recommended:  ?None, likely contaminant  ? ?No results found for this or any previous visit. ? ?Justin Solomon, PharmD, BCPS ?Clinical Pharmacist ?Phone: [phone] ? ?

## 2022-01-05 NOTE — Progress Notes (Signed)
Pharmacy Antibiotic Note ? ?[name] is a 71 y.o. male admitted on 01/18/22 with concerns for sepsis.  Pharmacy has been consulted for Meropenem dosing (continued from PTA at [facility], started 4/29). SCr up a bit to 4.13, CrCl ~20. ? ?Blood cultures growing gram positive rods 2/4 bottles. ? ?Plan: ?Adjust meropenem to 500 mg IV q12h ?Monitor clinical status, renal function ?Follow-up cultures, LOT, narrow as able  ? ?Height: 6' (182.9 cm) ?Weight: 104 kg (229 lb 4.5 oz) ?IBW/kg (Calculated) : 77.6 ? ?Temp (24hrs), Avg:97.1 ?F (36.2 ?C), Min:96.7 ?F (35.9 ?C), Max:97.9 ?F (36.6 ?C) ? ?Recent Labs  ?Lab 01/18/22 ?1436 18-Jan-2022 ?1437 01/18/22 ?1600 Jan 18, 2022 ?1740 01-18-2022 ?2300 01/04/22 ?0325 01/04/22 ?1901 01/05/22 ?1749  ?WBC  --   --   --  15.5*  --  21.0*  --   --   ?CREATININE 4.07*  --   --   --  3.97* 4.06* 4.01* 4.13*  ?LATICACIDVEN  --  1.5 1.6  --   --   --   --   --   ? ?  ?Estimated Creatinine Clearance: 19.9 mL/min (A) (by C-G formula based on SCr of 4.13 mg/dL (H)).   ? ?No Known Allergies ? ?Antimicrobials this admission: ?Meropenem 5/2 >>  ? ?Microbiology results: ?5/2 BCx: GPR 2/4 ?5/3 UCx: pending ?5/3 cdiff - neg ? ?Justin Solomon, PharmD, BCPS ?Please check AMION for all [facility] contact numbers ?Clinical Pharmacist ?01/05/2022 8:38 AM ?

## 2022-01-05 NOTE — Consult Note (Signed)
WOC Nurse Consult Note: ?New consult received for sacral wound. This patient was seen and assessed yesterday 5/3 and orders placed for wound care. Please see my note.  ? ?Renaldo Reel Katrinka Blazing, MSN, RN, CMSRN, AGCNS, WTA ?Wound Treatment Associate ?Pager [phone]  ? ? ? ? ?  ?

## 2022-01-05 NOTE — Progress Notes (Signed)
Date and time results received: 01/05/22 1609 ? ? ?Test: Hgb ?Critical Value: 6.5 ? ?Name of Provider Notified: Dr. Judeth Horn ? ?Orders Received? Or Actions Taken?: Orders received ?

## 2022-01-05 NOTE — Progress Notes (Signed)
Spoke with primary RN about picc placement. Once a Picc insertion order is placed we will place a new picc and remove the current one. We will try to prioritize placement as urgent however if  central access is needed before we can get there then it is recommended to please have MD place a CVC . ?

## 2022-01-06 DIAGNOSIS — I351 Nonrheumatic aortic (valve) insufficiency: Secondary | ICD-10-CM

## 2022-01-06 DIAGNOSIS — K922 Gastrointestinal hemorrhage, unspecified: Secondary | ICD-10-CM | POA: Diagnosis not present

## 2022-01-06 DIAGNOSIS — A419 Sepsis, unspecified organism: Secondary | ICD-10-CM | POA: Diagnosis not present

## 2022-01-06 DIAGNOSIS — R6521 Severe sepsis with septic shock: Secondary | ICD-10-CM | POA: Diagnosis not present

## 2022-01-06 DIAGNOSIS — D649 Anemia, unspecified: Secondary | ICD-10-CM | POA: Diagnosis not present

## 2022-01-06 DIAGNOSIS — N179 Acute kidney failure, unspecified: Secondary | ICD-10-CM | POA: Diagnosis not present

## 2022-01-06 LAB — TYPE AND SCREEN
ABO/RH(D): O POS
Antibody Screen: NEGATIVE
Unit division: 0
Unit division: 0
Unit division: 0

## 2022-01-06 LAB — PROTIME-INR
INR: 1.3 — ABNORMAL HIGH (ref 0.8–1.2)
Prothrombin Time: 16.1 seconds — ABNORMAL HIGH (ref 11.4–15.2)

## 2022-01-06 LAB — GLUCOSE, CAPILLARY
Glucose-Capillary: 112 mg/dL — ABNORMAL HIGH (ref 70–99)
Glucose-Capillary: 121 mg/dL — ABNORMAL HIGH (ref 70–99)
Glucose-Capillary: 121 mg/dL — ABNORMAL HIGH (ref 70–99)
Glucose-Capillary: 127 mg/dL — ABNORMAL HIGH (ref 70–99)
Glucose-Capillary: 126 mg/dL — ABNORMAL HIGH (ref 70–99)
Glucose-Capillary: 106 mg/dL — ABNORMAL HIGH (ref 70–99)

## 2022-01-06 LAB — BPAM RBC
Blood Product Expiration Date: 202305312359
Blood Product Expiration Date: 202305312359
Blood Product Expiration Date: 202306012359
ISSUE DATE / TIME: 202305022100
ISSUE DATE / TIME: 202305041711
ISSUE DATE / TIME: 202305042103
Unit Type and Rh: 5100
Unit Type and Rh: 5100
Unit Type and Rh: 5100

## 2022-01-06 LAB — BASIC METABOLIC PANEL
Anion gap: 12 (ref 5–15)
Anion gap: 11 (ref 5–15)
BUN: 141 mg/dL — ABNORMAL HIGH (ref 8–23)
BUN: 145 mg/dL — ABNORMAL HIGH (ref 8–23)
CO2: 22 mmol/L (ref 22–32)
CO2: 21 mmol/L — ABNORMAL LOW (ref 22–32)
Calcium: 7.9 mg/dL — ABNORMAL LOW (ref 8.9–10.3)
Calcium: 7.8 mg/dL — ABNORMAL LOW (ref 8.9–10.3)
Chloride: 89 mmol/L — ABNORMAL LOW (ref 98–111)
Chloride: 92 mmol/L — ABNORMAL LOW (ref 98–111)
Creatinine, Ser: 4.22 mg/dL — ABNORMAL HIGH (ref 0.61–1.24)
Creatinine, Ser: 4.55 mg/dL — ABNORMAL HIGH (ref 0.61–1.24)
GFR, Estimated: 14 mL/min — ABNORMAL LOW (ref >60)
GFR, Estimated: 13 mL/min — ABNORMAL LOW (ref >60)
Glucose, Bld: 103 mg/dL — ABNORMAL HIGH (ref 70–99)
Glucose, Bld: 131 mg/dL — ABNORMAL HIGH (ref 70–99)
Potassium: 5.1 mmol/L (ref 3.5–5.1)
Potassium: 4.9 mmol/L (ref 3.5–5.1)
Sodium: 123 mmol/L — ABNORMAL LOW (ref 135–145)
Sodium: 124 mmol/L — ABNORMAL LOW (ref 135–145)

## 2022-01-06 LAB — MAGNESIUM: Magnesium: 2.2 mg/dL (ref 1.7–2.4)

## 2022-01-06 LAB — HEMOGLOBIN AND HEMATOCRIT, BLOOD
HCT: 23.6 % — ABNORMAL LOW (ref 39.0–52.0)
Hemoglobin: 8.1 g/dL — ABNORMAL LOW (ref 13.0–17.0)

## 2022-01-06 MED ORDER — AMIODARONE IV BOLUS ONLY 150 MG/100ML
150.0000 mg | Freq: Once | INTRAVENOUS | Status: AC
  Administered 2022-01-06: 150 mg via INTRAVENOUS
  Filled 2022-01-06: qty 100

## 2022-01-06 MED ORDER — SODIUM CHLORIDE 0.9 % IV SOLN
20.0000 ug | Freq: Once | INTRAVENOUS | Status: AC
  Administered 2022-01-06: 20 ug via INTRAVENOUS
  Filled 2022-01-06: qty 5

## 2022-01-06 NOTE — Progress Notes (Signed)
eLink Physician-Brief Progress Note ?Patient Name: Justin Solomon ?DOB: Dec 30, 1950 ?MRN: [id number] ? ? ?Date of Service ? 01/06/2022  ?HPI/Events of Note ? AFIB with RVR - Ventricular rate = 100 - 120. K+ = 4.9 and Mg++ = 2.2. BP = 95/47 with MAP = 63.   ?eICU Interventions ? Plan: ?Continue present management.  ?Will consider another Amiodarone bolus if HR is consistently > 120.   ? ? ? ?Intervention Category ?Major Interventions: Arrhythmia - evaluation and management ? ?Justin Solomon ?01/06/2022, 10:21 PM ?

## 2022-01-06 NOTE — Progress Notes (Signed)
?Cardiology Progress Note  ?Patient ID: Justin Solomon ?MRN: [id number] ?DOB: 1951-01-02 ?Date of Encounter: 01/06/2022 ? ?Primary Cardiologist: None ? ?Subjective  ? ?Chief Complaint: None. Unable to communicate.  ? ?HPI: Chest x-ray overnight with enlargement of cardiac silhouette with vascular congestion.  Increased pulmonary edema.  Kidney function remains poor.  Uremic.  Lasix given with minimal urine output. ? ?ROS:  ?All other ROS reviewed and negative. Pertinent positives noted in the HPI.    ? ?Inpatient Medications  ?Scheduled Meds: ? alteplase  6 mg Intracatheter Once  ? alteplase  6 mg Intracatheter STAT  ? amiodarone  200 mg Per Tube Daily  ? vitamin C  250 mg Per Tube BID  ? chlorhexidine gluconate (MEDLINE KIT)  15 mL Mouth Rinse BID  ? Chlorhexidine Gluconate Cloth  6 each Topical Daily  ? feeding supplement (PROSource TF)  45 mL Per Tube QID  ? ferrous sulfate  300 mg Per Tube Daily  ? fiber  1 packet Per Tube Daily  ? heparin  5,000 Units Subcutaneous Q8H  ? mouth rinse  15 mL Mouth Rinse 10 times per day  ? multivitamin with minerals  1 tablet Per Tube Daily  ? mupirocin ointment  1 application. Nasal BID  ? pantoprazole (PROTONIX) IV  40 mg Intravenous Q12H  ? sodium chloride flush  10-40 mL Intracatheter Q12H  ? sodium zirconium cyclosilicate  10 g Per Tube Daily  ? zinc sulfate  220 mg Per Tube BID  ? ?Continuous Infusions: ? sodium chloride 10 mL/hr at 01/06/22 0600  ? feeding supplement (OSMOLITE 1.5 CAL) 1,000 mL (01/05/22 1652)  ? meropenem (MERREM) IV Stopped (01/05/22 2355)  ? norepinephrine (LEVOPHED) Adult infusion Stopped (01/06/22 0252)  ? ?PRN Meds: ?acetaminophen, sodium chloride flush  ? ?Vital Signs  ? ?Vitals:  ? 01/06/22 0300 01/06/22 0319 01/06/22 0330 01/06/22 0500  ?BP:   (!) 114/58   ?Pulse:   97   ?Resp:   (!) 21   ?Temp:  97.7 ?F (36.5 ?C)    ?TempSrc:  Axillary    ?SpO2: 99%  100%   ?Weight:    107.6 kg  ?Height:      ? ? ?Intake/Output Summary (Last 24 hours) at 01/06/2022  0650 ?Last data filed at 01/06/2022 0600 ?Gross per 24 hour  ?Intake 3445.15 ml  ?Output 315 ml  ?Net 3130.15 ml  ? ? ?  01/06/2022  ?  5:00 AM 01/04/2022  ?  5:00 AM 01/22/2022  ?  6:37 PM  ?Last 3 Weights  ?Weight (lbs) 237 lb 3.4 oz 229 lb 4.5 oz 231 lb 7.7 oz  ?Weight (kg) 107.6 kg 104 kg 105 kg  ?   ? ?Telemetry  ?Overnight telemetry shows ST 100s, which I personally reviewed.  ? ?Physical Exam  ? ?Vitals:  ? 01/06/22 0300 01/06/22 0319 01/06/22 0330 01/06/22 0500  ?BP:   (!) 114/58   ?Pulse:   97   ?Resp:   (!) 21   ?Temp:  97.7 ?F (36.5 ?C)    ?TempSrc:  Axillary    ?SpO2: 99%  100%   ?Weight:    107.6 kg  ?Height:      ?  ?Intake/Output Summary (Last 24 hours) at 01/06/2022 0650 ?Last data filed at 01/06/2022 0600 ?Gross per 24 hour  ?Intake 3445.15 ml  ?Output 315 ml  ?Net 3130.15 ml  ?  ? ?  01/06/2022  ?  5:00 AM 01/04/2022  ?  5:00 AM 01/26/2022  ?  6:37 PM  ?Last 3 Weights  ?Weight (lbs) 237 lb 3.4 oz 229 lb 4.5 oz 231 lb 7.7 oz  ?Weight (kg) 107.6 kg 104 kg 105 kg  ?  Body mass index is 32.17 kg/m?.  ?General: Ill-appearing, anasarca ?Head: Atraumatic, normal size  ?Eyes: PEERLA, EOMI  ?Neck: Supple, JVD 10 to 12 cm of water ?Endocrine: No thryomegaly ?Cardiac: Normal S1, S2; RRR; no murmurs, rubs, or gallops ?Lungs: Diminished breath sounds bilaterally, Rales ?Abd: Soft, nontender, no hepatomegaly  ?Ext: Pitting edema noted diffusely, 2+ ?Musculoskeletal: No deformities ?Skin: Warm and dry, no rashes   ?Neuro: Awake, will not follow commands ? ?Labs  ?High Sensitivity Troponin:   ?Recent Labs  ?Lab 01/11/2022 ?1436 01/11/2022 ?1600  ?TROPONINIHS 55* 57*  ?   ?Cardiac EnzymesNo results for input(s): TROPONINI in the last 168 hours. No results for input(s): TROPIPOC in the last 168 hours.  ?Chemistry ?Recent Labs  ?Lab 01/30/2022 ?1436 01/25/2022 ?1721 01/05/22 ?9758 01/05/22 ?8325 01/06/22 ?0409  ?NA 116*   < > 121* 121* 123*  ?K 6.9*   < > 5.1 5.2* 5.1  ?CL 86*   < > 87* 87* 89*  ?CO2 18*   < > 22 21* 22  ?GLUCOSE 113*   < >  129* 122* 103*  ?BUN 124*   < > 129* 131* 141*  ?CREATININE 4.07*   < > 4.13* 4.19* 4.22*  ?CALCIUM 8.2*   < > 8.0* 8.1* 7.9*  ?PROT 7.1  --   --   --   --   ?ALBUMIN <1.5*  --   --   --   --   ?AST 68*  --   --   --   --   ?ALT 41  --   --   --   --   ?ALKPHOS 113  --   --   --   --   ?BILITOT 0.8  --   --   --   --   ?GFRNONAA 15*   < > 14* 14* 14*  ?ANIONGAP 12   < > '12 13 12  ' ? < > = values in this interval not displayed.  ?  ?Hematology ?Recent Labs  ?Lab 01/15/2022 ?1740 01/04/22 ?0325 01/05/22 ?4982 01/05/22 ?1539 01/06/22 ?0409  ?WBC 15.5* 21.0* 15.6*  --   --   ?RBC 2.41* 2.99* 2.62*  --   --   ?HGB 6.8* 8.5* 7.4* 6.5* 8.1*  ?HCT 20.8* 25.6* 22.8* 20.0* 23.6*  ?MCV 86.3 85.6 87.0  --   --   ?MCH 28.2 28.4 28.2  --   --   ?MCHC 32.7 33.2 32.5  --   --   ?RDW 15.1 14.9 15.1  --   --   ?PLT 92* 97* 107*  --   --   ? ?BNP ?Recent Labs  ?Lab 01/22/2022 ?1600  ?BNP 149.0*  ?  ?DDimer No results for input(s): DDIMER in the last 168 hours.  ? ?Radiology  ?DG CHEST PORT 1 VIEW ? ?Result Date: 01/05/2022 ?CLINICAL DATA:  PICC line placement next EXAM: PORTABLE CHEST 1 VIEW COMPARISON:  Portable exam 1900 hours compared to 01/04/2022 FINDINGS: Tracheostomy tube projects over tracheal air column. RIGHT arm PICC line tip projects over SVC. LEFT arm PICC line tip projects over SVC at the azygous confluence. Enlargement of cardiac silhouette with pulmonary vascular congestion. BILATERAL infiltrates question pulmonary edema Coexistent atelectasis versus consolidation LEFT lower lobe. Small LEFT pleural effusion. No pneumothorax IMPRESSION: Enlargement of cardiac silhouette  with pulmonary vascular congestion and increased pulmonary edema. Coexistent small RIGHT pleural effusion and atelectasis versus consolidation LEFT lower lobe. Electronically Signed   By: Lavonia Dana M.D.   On: 01/05/2022 19:22  ? ?ECHOCARDIOGRAM COMPLETE ? ?Result Date: 01/04/2022 ?   ECHOCARDIOGRAM REPORT   Patient Name:   Justin Solomon Date of Exam: 01/04/2022  Medical Rec #:  [id number]      Height:       72.0 in Accession #:    [id number]     Weight:       229.3 lb Date of Birth:  01-12-1951       BSA:          2.258 m? Patient Age:    71 years       BP:           90/74 mmHg Patient Gender: M              HR:           109 bpm. Exam Location:  Inpatient Procedure: 2D Echo, Cardiac Doppler and Color Doppler Indications:    Dyspnea  History:        Patient has prior history of Echocardiogram examinations.                 Arrythmias:Atrial Fibrillation; Risk Factors:Hypertension.  Sonographer:    Jyl Heinz Referring Phys: [id number] Hortencia Conradi MEIER  Sonographer Comments: Echo performed with patient supine and on artificial respirator. IMPRESSIONS  1. 2.42 cm pericardial effusion (firbrinous strands within effusion). There is respiratory partial collapse of right ventricular free wall. IVC is dilated. There is suggestion of impending cardiac tamponade. Large pericardial effusion. The pericardial effusion is circumferential. Findings are consistent with cardiac tamponade.  2. Left ventricular ejection fraction, by estimation, is 60 to 65%. The left ventricle has normal function. The left ventricle has no regional wall motion abnormalities. There is mild left ventricular hypertrophy. Left ventricular diastolic parameters are consistent with Grade I diastolic dysfunction (impaired relaxation).  3. Right ventricular systolic function is normal. The right ventricular size is normal.  4. The aortic valve is tricuspid. There is moderate calcification of the aortic valve. There is moderate thickening of the aortic valve. Aortic valve regurgitation is moderate to severe. Moderate aortic valve stenosis. Aortic regurgitation PHT measures 361 msec. Aortic valve area, by VTI measures 2.00 cm?Marland Kitchen Aortic valve mean gradient measures 20.3 mmHg. Aortic valve Vmax measures 3.05 m/s.  5. The mitral valve is normal in structure. No evidence of mitral valve regurgitation. No evidence of mitral  stenosis.  6. The inferior vena cava is dilated in size with <50% respiratory variability, suggesting right atrial pressure of 15 mmHg.  7. Aortic dilatation noted. There is mild dilatation of the ascending

## 2022-01-06 NOTE — Progress Notes (Signed)
? ?NAME:  Justin Solomon, MRN:  [id number], DOB:  Dec 24, 1950, LOS: 3 ?ADMISSION DATE:  01/28/2022, CONSULTATION DATE:  01/29/2022 ?REFERRING MD:  Dr. Francia Greaves - EDP CHIEF COMPLAINT: Hypotension/abnormal labs ? ?History of Present Illness:  ?71 year old man who presented from [facility] for abnormal labs, progressive AKI and hyponatremia.  Reportedly on antibiotics at [facility] (meropenem x 7 days beginning 4/29, unclear indication). ? ?On ED arrival, patient afebrile, hypotensive with SBP 70s and normal saturations on mechanical ventilation (PRVC 16/500/5/28).  Labs significant for Na 116, K 6.9, Cl 86, bicarb 18, BUN 124, sCr 4.07 (prior 0.78), AST 68, albumin < 1.5, trop 55, LA 1.5. EKG demonstrated first degree block, rate 82.  CXR showing increased cardiomegaly and/or pericardial effusion with mild pulmonary edema, possible LLL infiltrate and small left pleural effusion; right PICC in place.  Blood cultures sent, pending UA.    ? ?Treated with 2L LR but no improvement; therefore, started on Levophed.  Hyperkalemia treated/medically shifted with insulin, D50, calcium gluconate, bicarb, Lokelma.   ? ?PCCM consulted for ICU admission. ? 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 ?Significant Hospital Events: ?Including procedures, antibiotic start and stop dates in addition to other pertinent events   ?5/2 admitted with AKI, hyperkalemia, hyponatremia ?5/3 Afib with rate 100s-120s. Ongoing hyponatremia, slight increase in WBC (21 from 15.5). Persistent AKI/ARF. RUE DVT, PICC associated. Echo with large pericardial effusion, not a candidate for intervention per Cards. ?5/4 Melena overnight/AM. PPI BID. GI consulted. Hgb drift to 6.5. 2U PRBCs given.  ?5/5 Ongoing melena, slowed. Hgb 8.1 (6.5) s/p PRBCs. Off pressors. ? ?Interim History / Subjective:  ?No significant events  overnight ?Off of pressors ?CXR with ?pulm edema overnight, Lasix 60mg  IV given ?Ongoing AKI/renal failure ?Hgb 8.1 (6.5) s/p 2U PRBCs ?Ongoing slow melena ?GI to see today ?D/w family re: GOC, possible PMT consult ? ?Objective:  ?Blood pressure (!) 114/58, pulse 97, temperature 97.7 ?F (36.5 ?C), temperature source Axillary, resp. rate (!) 21, height 6' (1.829 m), weight 107.6 kg, SpO2 100 %. ?   ?Vent Mode: PRVC ?FiO2 (%):  [40 %] 40 % ?Set Rate:  [16 bmp] 16 bmp ?Vt Set:  [500 mL] 500 mL ?PEEP:  [5 cmH20] 5 cmH20 ?Plateau Pressure:  [16 cmH20-23 cmH20] 18 cmH20  ? ?Intake/Output Summary (Last 24 hours) at 01/06/2022 0727 ?Last data filed at 01/06/2022 0600 ?Gross per 24 hour  ?Intake 3373.31 ml  ?Output 340 ml  ?Net 3033.31 ml  ? ? ?Filed Weights  ? 01/24/2022 1837 01/04/22 0500 01/06/22 0500  ?Weight: 105 kg 104 kg 107.6 kg  ? ?Physical Examination: ?General: Chronically ill-appearing elderly man in NAD. ?HEENT: [place]/AT, anicteric sclera, PERRL, moist mucous membranes. ?Neuro:  Drowsy, opens eyes readily to persistent voice/tactile stimulation.  Not following commands. No spontaneous movement of extremities in the setting of quadriplegia. +Cough and +Gag  ?CV: Mildly tachycardic, regular rhythm, no m/g/r. ?PULM: Breathing even and unlabored on vent (PEEP 5, FiO2 40%, chronic MV). Lung fields coarse throughout, loud upper airway sounds. ?GI: Soft, nontender, nondistended. Normoactive bowel sounds. PEG in place without drainage/erythema. ?Extremities: Bilateral symmetric 1+ pitting LE edema noted. ?Skin: Warm/dry, no rashes. Skin over LE shiny/taut. ? ?Resolved Hospital Problem List:   ? ?Assessment & Plan:  ? ?Chronic respiratory failure on MV/trach/PEG ?On home vent settings without significantly worsened secretion burden. ?- Continue full vent  support per prior outpatient settings ?- Wean FiO2 for O2 sat > 92% ?- VAP bundle ?- Bronchodilators PRN ?- Pulmonary hygiene ? ?Undifferentiated shock, possibly septic ?Unclear  etiology - possible septic shock but source unclear. Multiple potential sources - UA not yet collected, sacral decubitus, bacteremia (PICC in place). Does have cardiomegaly, was not very fluid responsive and IVC is distended on TTE raising possibility of cardiogenic or obstructive shock. ?- Goal MAP > 65 ?- Levophed titrated to goal MAP, weaned off 5/4PM ?- Trend WBC, fever curve ?- F/u Cx data ?- Continue broad-spectrum antibiotics (meropenem) ?- WOC following for pressure wounds (POA) ? ?Large pericardial effusion ?Moderate-to-severe AI ?Echo 5/3 with large pericardial effusion noted, partial RV wall collapse. ?- Cardiology following ?- No clinical signs of cardiac tamponade ?- Feel pericardial effusion is r/t uremia, previous AC; also feel this is chronic due to fibrinous stranding present throughout effusion ?- Not a candidate for pericardiocentesis, given multiorgan dysfunction; not currently indicated ? ?Melena ?History of GIB ?- GI consulted, appreciate recs ?- PPI BID ?- Hold AC ?- S/p DDAVP x 1 5/4 ?- Trend H&H ?- Transfuse for Hgb < 7.0 or active bleeding ? ?Hyponatremia ?Uremia ?Wasn't very responsive to trial of volume expansion in ED under the assumption he was hypovolemic. Bedside US with distended IVC with some respiratory variation - seems euvolemic to mildly hypervolemic.  ?- Trend Na, slowly normalizing ?- S/p DDAVP in the setting of elevated BUN ? ?AKI with hyperkalemia  ?Unclear if/why he was taking trimethoprim and whether that may have contributed to his AKI and hyponatremia as well. Not a dialysis candidate. ?- Trend BMP ?- Lokelma for K management ?- Replete electrolytes as indicated ?- Monitor I&Os ?- F/u urine studies ?- Avoid nephrotoxic agents as able ?- Ensure adequate renal perfusion ?- Consider Nephro consult ? ?Protein-calorie malnutrition ?- Continue TF ?- Appreciate RD/Nutrition assistance ? 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 ?AF ?- Continue home amiodarone ?- Hold Lopressor in the setting of hypotension ?- AC held due to active melena ? ?HTN ?- Hold doxazosin/Lopressor, resume as clinically appropriate ? ?[place] ?- Repeat GOC discussion with family needed, hope to address 5/5 ?- Patient has ongoing severe multiorgan system dysfunction that is not amenable to intervention/improvement ?- Will consult PMT to help facilitate further [place] discussions ?- Currently remains FULL CODE ? ?Best Practice: (right click and "Reselect all SmartList Selections" daily)  ? ?Diet/type: tubefeeds ?DVT prophylaxis: systemic heparin  ?GI prophylaxis: PPI ?Lines: yes and it is still needed ?Foley:  Yes, and it is still needed ?Code Status:  full code ?Last date of multidisciplinary goals of care discussion [Son updated at bedside, confirmed code status. Will attempt repeat [place] discussion 5/5] ? ?Critical care time: 38 minutes  ? ?Lestine Mount, PA-C ?[facility] ?01/06/22 7:27 AM ? ?Please see [email or web address] for pager details. ? ?From 7A-7P if no response, please call [phone] ?After hours, please call ELink [phone] ?

## 2022-01-06 NOTE — Progress Notes (Signed)
eLink Physician-Brief Progress Note ?Patient Name: Justin Solomon ?DOB: Jun 03, 1951 ?MRN: [id number] ? ? ?Date of Service ? 01/06/2022  ?HPI/Events of Note ? AFIB with RVR - Ventricular rate = 110 - 127. Patient with hx of AFIB on Amiodarone PO. Was in NSR earlier today. Not on anticoagulation d/t uremia and large pericardial effusion. Not felt to be a candidate for pericardiocentesis.   ?eICU Interventions ? Plan: ?Amiodarone 150 mg IV over 10 minutes now. ?BMP and Mg++ level STAT. ?Follow ventricular rate.   ? ? ? ?Intervention Category ?Major Interventions: Arrhythmia - evaluation and management ? ?Renatha Rosen Cornelia Copa ?01/06/2022, 7:31 PM ?

## 2022-01-06 NOTE — Progress Notes (Signed)
Cardiac Monitoring Event ? ?Dysrhythmia:  Atrial fibrillation ? ?Symptoms:  Asymptomatic ? ?Level of Consciousness:  Other (Comment)pt only opens eye to verbal stimuli at times. This has been patients baseline today  ? ?Last set of vital signs taken: ? Temp: 97.8 ?F (36.6 ?C) ? Pulse Rate: (!) 119 ? Resp: (!) 25 ? BP: (!) 112/53 ? SpO2: 99 % ? ?Name of MD Notified: Elink called ? ?Time MD Notified:  1900 ? ?Comments/Actions Taken:  see chart for orders ?

## 2022-01-07 DIAGNOSIS — I959 Hypotension, unspecified: Secondary | ICD-10-CM

## 2022-01-07 DIAGNOSIS — N179 Acute kidney failure, unspecified: Secondary | ICD-10-CM | POA: Diagnosis not present

## 2022-01-07 DIAGNOSIS — A419 Sepsis, unspecified organism: Secondary | ICD-10-CM | POA: Diagnosis not present

## 2022-01-07 DIAGNOSIS — R6521 Severe sepsis with septic shock: Secondary | ICD-10-CM | POA: Diagnosis not present

## 2022-01-07 LAB — CULTURE, BLOOD (ROUTINE X 2): Special Requests: ADEQUATE

## 2022-01-07 LAB — BASIC METABOLIC PANEL
Anion gap: 13 (ref 5–15)
Anion gap: 11 (ref 5–15)
BUN: 152 mg/dL — ABNORMAL HIGH (ref 8–23)
BUN: 153 mg/dL — ABNORMAL HIGH (ref 8–23)
CO2: 21 mmol/L — ABNORMAL LOW (ref 22–32)
CO2: 21 mmol/L — ABNORMAL LOW (ref 22–32)
Calcium: 8.2 mg/dL — ABNORMAL LOW (ref 8.9–10.3)
Calcium: 8.1 mg/dL — ABNORMAL LOW (ref 8.9–10.3)
Chloride: 89 mmol/L — ABNORMAL LOW (ref 98–111)
Chloride: 90 mmol/L — ABNORMAL LOW (ref 98–111)
Creatinine, Ser: 4.73 mg/dL — ABNORMAL HIGH (ref 0.61–1.24)
Creatinine, Ser: 4.65 mg/dL — ABNORMAL HIGH (ref 0.61–1.24)
GFR, Estimated: 12 mL/min — ABNORMAL LOW (ref >60)
GFR, Estimated: 13 mL/min — ABNORMAL LOW (ref >60)
Glucose, Bld: 95 mg/dL (ref 70–99)
Glucose, Bld: 120 mg/dL — ABNORMAL HIGH (ref 70–99)
Potassium: 5.2 mmol/L — ABNORMAL HIGH (ref 3.5–5.1)
Potassium: 5.2 mmol/L — ABNORMAL HIGH (ref 3.5–5.1)
Sodium: 123 mmol/L — ABNORMAL LOW (ref 135–145)
Sodium: 122 mmol/L — ABNORMAL LOW (ref 135–145)

## 2022-01-07 LAB — GLUCOSE, CAPILLARY
Glucose-Capillary: 101 mg/dL — ABNORMAL HIGH (ref 70–99)
Glucose-Capillary: 124 mg/dL — ABNORMAL HIGH (ref 70–99)
Glucose-Capillary: 131 mg/dL — ABNORMAL HIGH (ref 70–99)
Glucose-Capillary: 131 mg/dL — ABNORMAL HIGH (ref 70–99)
Glucose-Capillary: 146 mg/dL — ABNORMAL HIGH (ref 70–99)
Glucose-Capillary: 167 mg/dL — ABNORMAL HIGH (ref 70–99)

## 2022-01-07 LAB — CBC
HCT: 20.7 % — ABNORMAL LOW (ref 39.0–52.0)
Hemoglobin: 7.2 g/dL — ABNORMAL LOW (ref 13.0–17.0)
MCH: 29.9 pg (ref 26.0–34.0)
MCHC: 34.8 g/dL (ref 30.0–36.0)
MCV: 85.9 fL (ref 80.0–100.0)
Platelets: 109 10*3/uL — ABNORMAL LOW (ref 150–400)
RBC: 2.41 MIL/uL — ABNORMAL LOW (ref 4.22–5.81)
RDW: 15.5 % (ref 11.5–15.5)
WBC: 16.2 10*3/uL — ABNORMAL HIGH (ref 4.0–10.5)
nRBC: 0 % (ref 0.0–0.2)

## 2022-01-07 LAB — MAGNESIUM: Magnesium: 2.2 mg/dL (ref 1.7–2.4)

## 2022-01-07 LAB — ZINC: Zinc: 30 ug/dL — ABNORMAL LOW (ref 44–115)

## 2022-01-07 MED ORDER — AMIODARONE IV BOLUS ONLY 150 MG/100ML
150.0000 mg | Freq: Once | INTRAVENOUS | Status: AC
  Administered 2022-01-07: 150 mg via INTRAVENOUS
  Filled 2022-01-07: qty 100

## 2022-01-07 MED ORDER — MORPHINE SULFATE (PF) 2 MG/ML IV SOLN
1.0000 mg | INTRAVENOUS | Status: DC | PRN
  Administered 2022-01-07: 1 mg via INTRAVENOUS
  Filled 2022-01-07: qty 1

## 2022-01-07 MED ORDER — SODIUM ZIRCONIUM CYCLOSILICATE 10 G PO PACK
10.0000 g | PACK | Freq: Once | ORAL | Status: AC
  Administered 2022-01-07: 10 g

## 2022-01-07 MED ORDER — LINEZOLID 600 MG/300ML IV SOLN
600.0000 mg | Freq: Two times a day (BID) | INTRAVENOUS | Status: DC
  Administered 2022-01-07 – 2022-01-08 (×3): 600 mg via INTRAVENOUS
  Filled 2022-01-07 (×4): qty 300

## 2022-01-07 NOTE — IPAL (Signed)
?  Interdisciplinary Goals of Care Family Meeting ? ? ?Date carried out:: 01/07/2022 ? ?Location of the meeting: Bedside ? ?Member's involved: Bedside Registered Nurse and Family Member or next of kin ? ?Durable Power of Tour manager: Shared among patients son, daughter, and daughter-in-law ? ?Discussion: We discussed goals of care for Justin Solomon .  Patient's family was updated regarding multisystem organ dysfunction continued decline.  We reviewed Elyas's medical history which included prolonged stay at [facility] after suffering a spinal cord injury.  Patient's son indicates that her has had multiple medical setbacks while at [facility] including 8 separate bouts of pneumonia and development of severe pressure ulcers. ? ?Family was updated that Trig continues to show progressive decline including worsening organ dysfunction including progressive renal failure.  Family indicated that they would not want him  to suffer and wished to proceed with a DO NOT RESUSCITATE order change. ? ?They would like to continue current current aggressive measures but if clinical area continues to decline family would likely transition to comfort as quality of life is of utmost importance. ? ?Code status: Full DNR ? ?Disposition: Continue current acute care ? ?Time spent for the meeting: 35 ? ? ?Korben Carcione D. Harris, NP-C ?[facility] ?Personal contact information can be found on Amion  ?01/07/2022, 2:13 PM ? ? ?

## 2022-01-07 NOTE — Progress Notes (Signed)
eLink Physician-Brief Progress Note ?Patient Name: Justin Solomon ?DOB: 09/27/1950 ?MRN: [id number] ? ? ?Date of Service ? 01/07/2022  ?HPI/Events of Note ? AFIB with RVR - Ventricular rate = 110-127. BP = 100/52 with MAP = 66.   ?eICU Interventions ? Plan: ?Bolus with Amiodarone 150 mg IV over 10 minutes now.   ? ? ? ?Intervention Category ?Major Interventions: Arrhythmia - evaluation and management ? ?Jeiry Birnbaum Cornelia Copa ?01/07/2022, 2:19 AM ?

## 2022-01-07 NOTE — Progress Notes (Signed)
eLink Physician-Brief Progress Note ?Patient Name: Justin Solomon ?DOB: Mar 22, 1951 ?MRN: [id number] ? ? ?Date of Service ? 01/07/2022  ?HPI/Events of Note ? Hyperkalemia - K+ = 5.2.   ?eICU Interventions ? Plan: ?Nursing asked to give [facility] scheduled for 10 AM now. ?Repeat BMP at 12 noon.   ? ? ? ?Intervention Category ?Major Interventions: Electrolyte abnormality - evaluation and management ? ?Khan Chura Dennard Nip ?01/07/2022, 4:38 AM ?

## 2022-01-07 NOTE — Plan of Care (Signed)
?  Problem: Education: ?Goal: Knowledge of General Education information will improve ?Description: Including pain rating scale, medication(s)/side effects and non-pharmacologic comfort measures ?Outcome: Not Progressing ?  ?Problem: Health Behavior/Discharge Planning: ?Goal: Ability to manage health-related needs will improve ?Outcome: Not Progressing ?  ?Problem: Clinical Measurements: ?Goal: Ability to maintain clinical measurements within normal limits will improve ?Outcome: Not Progressing ?Goal: Will remain free from infection ?Outcome: Not Progressing ?Goal: Diagnostic test results will improve ?Outcome: Not Progressing ?Goal: Respiratory complications will improve ?Outcome: Not Progressing ?Goal: Cardiovascular complication will be avoided ?Outcome: Not Progressing ?  ?Problem: Activity: ?Goal: Risk for activity intolerance will decrease ?Outcome: Not Progressing ?  ?Problem: Nutrition: ?Goal: Adequate nutrition will be maintained ?Outcome: Not Progressing ?  ?Problem: Coping: ?Goal: Level of anxiety will decrease ?Outcome: Not Progressing ?  ?Problem: Elimination: ?Goal: Will not experience complications related to bowel motility ?Outcome: Not Progressing ?  ?Problem: Pain Managment: ?Goal: General experience of comfort will improve ?Outcome: Not Progressing ?  ?Problem: Skin Integrity: ?Goal: Risk for impaired skin integrity will decrease ?Outcome: Not Progressing ?  ?Problem: Activity: ?Goal: Ability to tolerate increased activity will improve ?Outcome: Not Progressing ?  ?Problem: Respiratory: ?Goal: Ability to maintain a clear airway and adequate ventilation will improve ?Outcome: Not Progressing ?  ?Problem: Role Relationship: ?Goal: Method of communication will improve ?Outcome: Not Progressing ?  ?Problem: Safety: ?Goal: Non-violent Restraint(s) ?Outcome: Not Progressing ?  ?

## 2022-01-07 NOTE — Progress Notes (Signed)
? ?NAME:  Justin Solomon, MRN:  [id number], DOB:  October 12, 1950, LOS: 4 ?ADMISSION DATE:  01/04/2022, CONSULTATION DATE:  01/29/2022 ?REFERRING MD:  Dr. Francia Greaves - EDP CHIEF COMPLAINT: Hypotension/abnormal labs ? ?History of Present Illness:  ?71 year old man who presented from [facility] for abnormal labs, progressive AKI and hyponatremia.  Reportedly on antibiotics at [facility] (meropenem x 7 days beginning 4/29, unclear indication). ? ?On ED arrival, patient afebrile, hypotensive with SBP 70s and normal saturations on mechanical ventilation (PRVC 16/500/5/28).  Labs significant for Na 116, K 6.9, Cl 86, bicarb 18, BUN 124, sCr 4.07 (prior 0.78), AST 68, albumin < 1.5, trop 55, LA 1.5. EKG demonstrated first degree block, rate 82.  CXR showing increased cardiomegaly and/or pericardial effusion with mild pulmonary edema, possible LLL infiltrate and small left pleural effusion; right PICC in place.  Blood cultures sent, pending UA.    ? ?Treated with 2L LR but no improvement; therefore, started on Levophed.  Hyperkalemia treated/medically shifted with insulin, D50, calcium gluconate, bicarb, Lokelma.   ? ?PCCM consulted for ICU admission. ? 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 ?Significant Hospital Events: ?Including procedures, antibiotic start and stop dates in addition to other pertinent events   ?5/2 admitted with AKI, hyperkalemia, hyponatremia ?5/3 Afib with rate 100s-120s. Ongoing hyponatremia, slight increase in WBC (21 from 15.5). Persistent AKI/ARF. RUE DVT, PICC associated. Echo with large pericardial effusion, not a candidate for intervention per Cards. ?5/4 Melena overnight/AM. PPI BID. GI consulted. Hgb drift to 6.5. 2U PRBCs given.  ?5/5 Ongoing melena, slowed. Hgb 8.1 (6.5) s/p PRBCs. Off pressors. ?5/6 patient developed A-fib RVR overnight requiring 2  boluses of IV amiodarone.  Renal function continues to worsen with hyperkalemia ? ?Interim History / Subjective:  ?Eyes spontaneously open, intermittently tracks movement in room.  Renal function continues to worsen with hyperkalemia ? ?Objective:  ?Blood pressure (!) 99/53, pulse (!) 110, temperature 98 ?F (36.7 ?C), temperature source Oral, resp. rate (!) 23, height 6' (1.829 m), weight 107.9 kg, SpO2 100 %. ?   ?Vent Mode: PRVC ?FiO2 (%):  [40 %] 40 % ?Set Rate:  [16 bmp] 16 bmp ?Vt Set:  [500 mL] 500 mL ?PEEP:  [5 cmH20] 5 cmH20 ?Plateau Pressure:  [15 cmH20-23 cmH20] 18 cmH20  ? ?Intake/Output Summary (Last 24 hours) at 01/07/2022 0743 ?Last data filed at 01/07/2022 0600 ?Gross per 24 hour  ?Intake 2204.51 ml  ?Output 131 ml  ?Net 2073.51 ml  ? ? ?Filed Weights  ? 01/04/22 0500 01/06/22 0500 01/07/22 0344  ?Weight: 104 kg 107.6 kg 107.9 kg  ? ?Physical Examination: ?General: Acute on chronic deconditioned ill elderly male lying in bed in no acute distress ?HEENT: 7 cuffed trach midline, MM pink/moist, PERRL,  ?Neuro: Eyes spontaneously open, unable to follow commands, flinches to touch of swollen scrotum and legs indicating likely discomfort ?CV: s1s2 regular rate and rhythm, no murmur, rubs, or gallops,  ?PULM: Clear to auscultation bilaterally, no increased work of breathing, no added breath sounds, tolerating ventilator ?GI: soft, bowel sounds active in all 4 quadrants, non-tender, non-distended, tolerating TF ?Extremities: warm/dry, 3+ lower extremity and scrotal edema  ?Skin: no rashes or lesions ? ?Resolved Hospital Problem List:   ? ?Assessment & Plan:  ? ?Chronic respiratory failure on MV/trach/PEG ?-On home vent settings without significantly worsened secretion burden. ?P: ?Remains on full vent support with  prior outpatient settings ?Wean PEEP and FiO2 for sats greater than 90%. ?Head of bed elevated 30 degrees. ?Plateau pressures less than 30 cm H20.  ?Follow intermittent chest x-ray and ABG.   ?SAT/SBT  as tolerated, mentation preclude extubation  ?Ensure adequate pulmonary hygiene  ?Follow cultures  ?VAP bundle in place  ?PAD protocol ?As needed bronchodilators ? ?Undifferentiated shock, possibly septic ?-Unclear etiology - possible septic shock but source unclear. Multiple potential sources - UA not yet collected, sacral decubitus, bacteremia (PICC in place). Does have cardiomegaly, was not very fluid responsive and IVC is distended on TTE raising possibility of cardiogenic or obstructive shock. ?P: ?Continue pressors for map goal greater than 65, assumed overnight 5/6 ?Continue to trend CBC and fever curve ?Follow culture data ?Continue meropenem ?WOC following ? ?Large pericardial effusion ?Moderate-to-severe AI ?-Echo 5/3 with large pericardial effusion noted, partial RV wall collapse. ?- Feel pericardial effusion is r/t uremia, previous AC; also feel this is chronic due to fibrinous stranding present throughout effusion ?P: ?Cardiology following ?For signs of cardiac tamponade ?Per cardiology patient is not a candidate for pericardiocentesis given frailty and multisystem organ dysfunction ?Supportive care ? ?Melena ?History of GIB ?P: ?GI consulted, following ?Again patient is not a candidate for aggressive endoscopy procedures given frailty and multisystem organ dysfunction ?Continue twice daily PPI ?Hold any anticoagulation ?CBC ?Transfuse per protocol ?Hemoglobin goal greater than 7 ? ?Hyponatremia ?Uremia ?-Wasn't very responsive to trial of volume expansion in ED under the assumption he was hypovolemic. Bedside US with distended IVC with some respiratory variation - seems euvolemic to mildly hypervolemic.  ?P: ?Continue to trend be met ?S/p DDAVP in the setting of elevated BUN ? ?AKI with hyperkalemia - worsening ?-Unclear if/why he was taking trimethoprim and whether that may have contributed to his AKI and hyponatremia as well. Not a dialysis candidate.  ?-Creatinine on admission 4.07, GFR 15,  initially given acute critical care interventions renal function improved however unfortunately creatinine now continues to climb with associated hyperkalemia  ?P: ?Follow renal function  ?Monitor urine output ?Trend Bmet ?Avoid nephrotoxins ?Ensure adequate renal perfusion  ?Continue temporizing agents for hyperkalemia ?Received Lokelma this a.m. ?May need to consider nephrology consult pending goals of care ? ?Protein-calorie malnutrition ?P: ?Continue tube feeds ?Registered dietitian/nutrition following, appreciate assistance ? 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 ?Atrial fibrillation ?-Developed A-fib RVR overnight requiring 2 boluses of IV amiodarone overnight ?P: ?Continue home amiodarone ?Continuous telemetry ?Anticoagulation on hold given active melena ?Unable to utilize beta-blocker as well given hypotension ? ?HTN ?P: ?Currently hypotensive therefore hold home antihypertensives ?Resume when appropriate ? ?[place] ?-Will attempt to reengage family today for goals of care discussion given progressive multisystem organ dysfunction ? ?Best Practice: (right click and "Reselect all SmartList Selections" daily)  ? ?Diet/type: tubefeeds ?DVT prophylaxis: systemic heparin  ?GI prophylaxis: PPI ?Lines: yes and it is still needed ?Foley:  Yes, and it is still needed ?Code Status:  full code ?Last date of multidisciplinary goals of care discussion: No family at bedside a.m. of 5/5 will engage family later today ? ?Critical care time: 38 minutes  ? ?Performed by: Dwane Andres D. Harris ? ?Total critical care time: 40 minutes ? ?Critical care time was exclusive of separately billable procedures and treating other patients. ? ?Critical care was necessary to treat or prevent imminent or life-threatening deterioration. ? ?  Critical care was time spent  personally by me on the following activities: development of treatment plan with patient and/or surrogate as well as nursing, discussions with consultants, evaluation of patient's response to treatment, examination of

## 2022-01-07 NOTE — Progress Notes (Signed)
eLink Physician-Brief Progress Note ?Patient Name: Justin Solomon ?DOB: 1950/09/29 ?MRN: [id number] ? ? ?Date of Service ? 01/07/2022  ?HPI/Events of Note ? Nursing reports patient with bloody stools today and on Heparin [place]. Already on SCD's.  ?eICU Interventions ? Plan: ?D/C Heparin [place]. ?Continue SCD's/  ? ? ? ?Intervention Category ?Major Interventions: Other: ? ?Felica Chargois Dennard Nip ?01/07/2022, 11:57 PM ?

## 2022-01-08 DIAGNOSIS — A419 Sepsis, unspecified organism: Secondary | ICD-10-CM | POA: Diagnosis not present

## 2022-01-08 DIAGNOSIS — N179 Acute kidney failure, unspecified: Secondary | ICD-10-CM | POA: Diagnosis not present

## 2022-01-08 DIAGNOSIS — R6521 Severe sepsis with septic shock: Secondary | ICD-10-CM | POA: Diagnosis not present

## 2022-01-08 LAB — BASIC METABOLIC PANEL
Anion gap: 12 (ref 5–15)
BUN: 153 mg/dL — ABNORMAL HIGH (ref 8–23)
CO2: 21 mmol/L — ABNORMAL LOW (ref 22–32)
Calcium: 8.2 mg/dL — ABNORMAL LOW (ref 8.9–10.3)
Chloride: 93 mmol/L — ABNORMAL LOW (ref 98–111)
Creatinine, Ser: 4.73 mg/dL — ABNORMAL HIGH (ref 0.61–1.24)
GFR, Estimated: 12 mL/min — ABNORMAL LOW (ref >60)
Glucose, Bld: 138 mg/dL — ABNORMAL HIGH (ref 70–99)
Potassium: 5.4 mmol/L — ABNORMAL HIGH (ref 3.5–5.1)
Sodium: 126 mmol/L — ABNORMAL LOW (ref 135–145)

## 2022-01-08 LAB — GLUCOSE, CAPILLARY
Glucose-Capillary: 120 mg/dL — ABNORMAL HIGH (ref 70–99)
Glucose-Capillary: 114 mg/dL — ABNORMAL HIGH (ref 70–99)

## 2022-01-08 LAB — CBC
HCT: 18.9 % — ABNORMAL LOW (ref 39.0–52.0)
Hemoglobin: 6.1 g/dL — CL (ref 13.0–17.0)
MCH: 28.2 pg (ref 26.0–34.0)
MCHC: 32.3 g/dL (ref 30.0–36.0)
MCV: 87.5 fL (ref 80.0–100.0)
Platelets: 123 10*3/uL — ABNORMAL LOW (ref 150–400)
RBC: 2.16 MIL/uL — ABNORMAL LOW (ref 4.22–5.81)
RDW: 15.7 % — ABNORMAL HIGH (ref 11.5–15.5)
WBC: 17.5 10*3/uL — ABNORMAL HIGH (ref 4.0–10.5)
nRBC: 0 % (ref 0.0–0.2)

## 2022-01-08 LAB — PREPARE RBC (CROSSMATCH)

## 2022-01-08 MED ORDER — MORPHINE SULFATE (PF) 2 MG/ML IV SOLN: 2.0000 mg | INTRAVENOUS | Status: DC | PRN

## 2022-01-08 MED ORDER — GLYCOPYRROLATE 0.2 MG/ML IJ SOLN
0.2000 mg | INTRAMUSCULAR | Status: DC | PRN
Start: 2022-01-08 — End: 2022-01-09
  Administered 2022-01-08: 0.2 mg via INTRAVENOUS
  Filled 2022-01-08: qty 1

## 2022-01-08 MED ORDER — GLYCOPYRROLATE 1 MG PO TABS: 1.0000 mg | ORAL_TABLET | ORAL | Status: DC | PRN

## 2022-01-08 MED ORDER — ACETAMINOPHEN 650 MG RE SUPP: 650.0000 mg | Freq: Four times a day (QID) | RECTAL | Status: DC | PRN

## 2022-01-08 MED ORDER — MORPHINE BOLUS VIA INFUSION
5.0000 mg | INTRAVENOUS | Status: DC | PRN
  Filled 2022-01-08: qty 5

## 2022-01-08 MED ORDER — MORPHINE 100MG IN NS 100ML (1MG/ML) PREMIX INFUSION
0.0000 mg/h | INTRAVENOUS | Status: DC
  Administered 2022-01-08: 5 mg/h via INTRAVENOUS
  Filled 2022-01-08: qty 100

## 2022-01-08 MED ORDER — DIPHENHYDRAMINE HCL 50 MG/ML IJ SOLN: 25.0000 mg | INTRAMUSCULAR | Status: DC | PRN

## 2022-01-08 MED ORDER — POLYVINYL ALCOHOL 1.4 % OP SOLN
1.0000 [drp] | Freq: Four times a day (QID) | OPHTHALMIC | Status: DC | PRN
  Filled 2022-01-08: qty 15

## 2022-01-08 MED ORDER — GLYCOPYRROLATE 0.2 MG/ML IJ SOLN: 0.2000 mg | INTRAMUSCULAR | Status: DC | PRN

## 2022-01-08 MED ORDER — SODIUM CHLORIDE 0.9% IV SOLUTION: Freq: Once | INTRAVENOUS | Status: AC

## 2022-01-08 MED ORDER — ACETAMINOPHEN 325 MG PO TABS: 650.0000 mg | ORAL_TABLET | Freq: Four times a day (QID) | ORAL | Status: DC | PRN

## 2022-01-08 MED ORDER — LORAZEPAM 2 MG/ML IJ SOLN
2.0000 mg | INTRAMUSCULAR | Status: DC | PRN
  Administered 2022-01-08: 4 mg via INTRAVENOUS
  Filled 2022-01-08: qty 2

## 2022-01-09 LAB — BPAM RBC
Blood Product Expiration Date: 202305312359
ISSUE DATE / TIME: 202305070456
Unit Type and Rh: 5100

## 2022-01-09 LAB — TYPE AND SCREEN
ABO/RH(D): O POS
Antibody Screen: NEGATIVE
Unit division: 0

## 2022-01-11 DIAGNOSIS — S31000A Unspecified open wound of lower back and pelvis without penetration into retroperitoneum, initial encounter: Secondary | ICD-10-CM | POA: Diagnosis present

## 2022-01-11 DIAGNOSIS — I3139 Other pericardial effusion (noninflammatory): Secondary | ICD-10-CM | POA: Diagnosis present

## 2022-01-11 DIAGNOSIS — R7881 Bacteremia: Secondary | ICD-10-CM | POA: Diagnosis present

## 2022-01-11 LAB — VITAMIN A: Vitamin A (Retinoic Acid): 48.8 ug/dL (ref 22.0–69.5)

## 2022-01-12 LAB — VITAMIN C: Vitamin C: 10.6 mg/dL — ABNORMAL HIGH (ref 0.4–2.0)

## 2022-02-02 NOTE — IPAL (Signed)
?  Interdisciplinary Goals of Care Family Meeting ? ? ?Date carried out:: 01/14/2022 ? ?Location of the meeting: Conference room ? ?Member's involved: Nurse Practitioner, Bedside Registered Nurse, and Family Member or next of kin ? ?Durable Power of Insurance risk surveyor:  ?Several family members  ? ?Discussion: Large group of family presented to bedside to further discuss goals of care. The family was informed that Justin Solomon continues to show further signs of worsening multisystem organ dysfunction with worsening renal failure and progressive GI bleed. This coupled with concerns for pain and/or suffering has led the family to decide to move toward comfort care.  ? ?At time time we will initiate a morphine drip and once comfortable we will wean pressors and discontinue the ventilator. All family is in agreement to plan. Orders placed ? ?Code status: Full DNR ? ?Disposition: In-patient comfort care ? ? ?Time spent for the meeting: 32 mins ? ?Justin Solomon ?01/18/2022, 3:12 PM ? ?

## 2022-02-02 NOTE — Progress Notes (Signed)
Notified Dr. Arsenio Loader that patient has had 2, moderately bloody stools so far this shift. Patient has Heparin SQ ordered to be administered at 2200. Notified E-Link and Dr. Arsenio Loader regarding patient's bloody stools, HGB of 7.2, and scheduled Heparin SQ. Order received to hold and d/c Heparin at this time.  ? ?

## 2022-02-02 NOTE — Progress Notes (Signed)
? ?NAME:  Justin Solomon, MRN:  [id number], DOB:  September 13, 1950, LOS: 5 ?ADMISSION DATE:  01/13/2022, CONSULTATION DATE:  01/26/2022 ?REFERRING MD:  Dr. Francia Greaves - EDP CHIEF COMPLAINT: Hypotension/abnormal labs ? ?History of Present Illness:  ?71 year old man who presented from [facility] for abnormal labs, progressive AKI and hyponatremia.  Reportedly on antibiotics at [facility] (meropenem x 7 days beginning 4/29, unclear indication). ? ?On ED arrival, patient afebrile, hypotensive with SBP 70s and normal saturations on mechanical ventilation (PRVC 16/500/5/28).  Labs significant for Na 116, K 6.9, Cl 86, bicarb 18, BUN 124, sCr 4.07 (prior 0.78), AST 68, albumin < 1.5, trop 55, LA 1.5. EKG demonstrated first degree block, rate 82.  CXR showing increased cardiomegaly and/or pericardial effusion with mild pulmonary edema, possible LLL infiltrate and small left pleural effusion; right PICC in place.  Blood cultures sent, pending UA.    ? ?Treated with 2L LR but no improvement; therefore, started on Levophed.  Hyperkalemia treated/medically shifted with insulin, D50, calcium gluconate, bicarb, Lokelma.   ? ?PCCM consulted for ICU admission. ? 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 ?Significant Hospital Events: ?Including procedures, antibiotic start and stop dates in addition to other pertinent events   ?5/2 admitted with AKI, hyperkalemia, hyponatremia ?5/3 Afib with rate 100s-120s. Ongoing hyponatremia, slight increase in WBC (21 from 15.5). Persistent AKI/ARF. RUE DVT, PICC associated. Echo with large pericardial effusion, not a candidate for intervention per Cards. ?5/4 Melena overnight/AM. PPI BID. GI consulted. Hgb drift to 6.5. 2U PRBCs given.  ?5/5 Ongoing melena, slowed. Hgb 8.1 (6.5) s/p PRBCs. Off pressors. ?5/6 patient developed A-fib RVR overnight requiring 2  boluses of IV amiodarone.  Renal function continues to worsen with hyperkalemia ?5/7 continued GI bleed overnight requiring transfusion in a.m. due to hemoglobin drop to 6.1.  Renal function also continues to worsen with persistent hyperkalemia ? ?Interim History / Subjective:  ?Continues to track movement in room with eyes but is unable to follow any commands ? ?Objective:  ?Blood pressure (!) 119/56, pulse 98, temperature 98 ?F (36.7 ?C), temperature source Axillary, resp. rate (!) 26, height 6' (1.829 m), weight 108.4 kg, SpO2 100 %. ?   ?Vent Mode: PRVC ?FiO2 (%):  [30 %] 30 % ?Set Rate:  [16 bmp] 16 bmp ?Vt Set:  [500 mL] 500 mL ?PEEP:  [5 cmH20] 5 cmH20 ?Plateau Pressure:  [18 cmH20-19 cmH20] 19 cmH20  ? ?Intake/Output Summary (Last 24 hours) at 01-13-22 0806 ?Last data filed at 13-Jan-2022 2423 ?Gross per 24 hour  ?Intake 2944.01 ml  ?Output 155 ml  ?Net 2789.01 ml  ? ? ?Filed Weights  ? 01/06/22 0500 01/07/22 0344 01/13/22 0500  ?Weight: 107.6 kg 107.9 kg 108.4 kg  ? ?Physical Examination: ?General: Acute on chronic ill-appearing deconditioned elderly male lying in bed with contractures ?HEENT: 7 cuffed Shiley trach midline, MM pink/moist, PERRL,  ?Neuro: Eyes spontaneously open and will track movement in room unable to follow commands ?CV: s1s2 regular rate and rhythm, no murmur, rubs, or gallops,  ?PULM: Clear to auscultation bilaterally, no increased work of breathing, slightly diminished bases, tolerating ventilator ?GI: soft, bowel sounds active in all 4 quadrants, non-tender, non-distended, tolerating TF ?Extremities: warm/dry, no edema  ?Skin: no rashes or lesions ? ? ?Resolved Hospital Problem List:   ? ?Assessment & Plan:  ? ?Chronic respiratory failure on MV/trach/PEG ?-On home vent  settings without significantly worsened secretion burden. ?P: ?Remains on preadmission ventilator settings ?Continue ventilator support with lung protective strategies  ?Wean PEEP and FiO2 for sats greater than 90%. ?Head  of bed elevated 30 degrees. ?Plateau pressures less than 30 cm H20.  ?Follow intermittent chest x-ray and ABG.   ?SAT/SBT as tolerated, mentation preclude extubation  ?Ensure adequate pulmonary hygiene  ?Follow cultures  ?VAP bundle in place  ?PAD protocol ?As needed bronchodilators ? ?Undifferentiated shock, multifactorial  ?Corynebacterium Striatum bacteremia ?-Unclear etiology - possible septic shock but source unclear. Multiple potential sources - UA not yet collected, sacral decubitus, bacteremia (PICC in place). Does have cardiomegaly, was not very fluid responsive and IVC is distended on TTE raising possibility of cardiogenic or obstructive shock. ?P: ?Patient continues to require pressor support to maintain MAP greater than 65 ?Increased pressor requirements overnight likely due to active GI bleed ?To trend CBC and fever curve ?Follow culture data ?WOC following continue telemetry ? ?Large pericardial effusion ?Moderate-to-severe AI ?-Echo 5/3 with large pericardial effusion noted, partial RV wall collapse. ?- Feel pericardial effusion is r/t uremia, previous AC; also feel this is chronic due to fibrinous stranding present throughout effusion ?P: ?Cardiology following, appreciate assistance ?Monitor for signs of worsening cardiac tamponade ?Per cardiology patient is not a candidate for pericardiocentesis given frailty and multisystem organ dysfunction ?Supportive care ? ?Melena ?History of GIB ?P: ?GI consulted, appreciate assistance ?Given patient is deemed not a candidate for aggressive endoscopy procedures given frailty and multisystem organ dysfunction ?Continue PPI twice daily ?Hold any anticoagulation ?Transfuse per protocol ?Hemoglobin goal greater than 7 ? ?Hyponatremia ?Uremia ?-Wasn't very responsive to trial of volume expansion in ED under the assumption he was hypovolemic. Bedside US with distended IVC with some respiratory variation - seems euvolemic to mildly hypervolemic.  ?P: ?Continue to  trend bmet ?S/p DDAVP in the setting of elevated BUN ? ?AKI with hyperkalemia - worsening ?-Unclear if/why he was taking trimethoprim and whether that may have contributed to his AKI and hyponatremia as well. Not a dialysis candidate.  ?-Creatinine on admission 4.07, GFR 15, initially given acute critical care interventions renal function improved however unfortunately creatinine now continues to climb with associated hyperkalemia  ?P: ?Renal function continues to decline with the associated worsening hyperkalemia ?Follow renal function  ?There urine output ?Trend Bmet ?Avoid nephrotoxins ?Ensure adequate renal perfusion  ?Not a candidate for hemodialysis ? ?Protein-calorie malnutrition ?P: ?Hold tube feeds given active bleed ? 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 ?Atrial fibrillation ?-Developed A-fib RVR overnight requiring 2 boluses of IV amiodarone overnight ?P: ?Continue home amiodarone ?Continuous telemetry ?Unable to anticoagulate given active GI bleed ?Unable to utilize beta-blocker due to hypotension ? ?HTN ?P: ?Currently hypotensive therefore hold home antihypertensives ?Resume appropriate ? ?[place] ?-Met with patient's son, daughter, and daughter-in-law 5/6 with ultimate decision made to transition to DO NOT RESUSCITATE.  CDI PAL note dictated 5/6 for full details. ? ?Best Practice: (right click and "Reselect all SmartList Selections" daily)  ? ?Diet/type: tubefeeds ?DVT prophylaxis: systemic heparin  ?GI prophylaxis: PPI ?Lines: yes and it is still needed ?Foley:  Yes, and it is still needed ?Code Status:  full code ?Last date of multidisciplinary goals of care discussion: See above  ? ?Critical care time: 38 minutes  ? ?Performed by: Lacrisha Bielicki D. Harris ? ?Total critical care time: 30  minutes ? ?Critical care time was exclusive of  separately billable procedures and treating other patients. ? ?Critical care was necessary to treat or prevent imminent or life-threatening deterioration. ? ?Critical care was time spent personally by me on the f

## 2022-02-02 NOTE — Progress Notes (Signed)
Patient expired at 1934 surrounded by family. ?Safiyya Stokes RN and Allegra Grana RN verified death. ? ?Elink MD notified. ?

## 2022-02-02 NOTE — Progress Notes (Signed)
Patient transported to morgue by [employer]. ? ?Only belonging found in room was a round green pillow- taken with patient to morgue. ?

## 2022-02-02 NOTE — Plan of Care (Signed)
?  Problem: Education: ?Goal: Knowledge of General Education information will improve ?Description: Including pain rating scale, medication(s)/side effects and non-pharmacologic comfort measures ?Outcome: Not Progressing ?  ?Problem: Health Behavior/Discharge Planning: ?Goal: Ability to manage health-related needs will improve ?Outcome: Not Progressing ?  ?Problem: Clinical Measurements: ?Goal: Ability to maintain clinical measurements within normal limits will improve ?Outcome: Not Progressing ?Goal: Will remain free from infection ?Outcome: Not Progressing ?Goal: Diagnostic test results will improve ?Outcome: Not Progressing ?Goal: Respiratory complications will improve ?Outcome: Not Progressing ?Goal: Cardiovascular complication will be avoided ?Outcome: Not Progressing ?  ?Problem: Activity: ?Goal: Risk for activity intolerance will decrease ?Outcome: Not Progressing ?  ?Problem: Nutrition: ?Goal: Adequate nutrition will be maintained ?Outcome: Not Progressing ?  ?Problem: Coping: ?Goal: Level of anxiety will decrease ?Outcome: Not Progressing ?  ?Problem: Elimination: ?Goal: Will not experience complications related to bowel motility ?Outcome: Not Progressing ?  ?Problem: Pain Managment: ?Goal: General experience of comfort will improve ?Outcome: Not Progressing ?  ?Problem: Skin Integrity: ?Goal: Risk for impaired skin integrity will decrease ?Outcome: Not Progressing ?  ?Problem: Activity: ?Goal: Ability to tolerate increased activity will improve ?Outcome: Not Progressing ?  ?Problem: Respiratory: ?Goal: Ability to maintain a clear airway and adequate ventilation will improve ?Outcome: Not Progressing ?  ?Problem: Role Relationship: ?Goal: Method of communication will improve ?Outcome: Not Progressing ?  ?Problem: Safety: ?Goal: Non-violent Restraint(s) ?Outcome: Not Progressing ?  ?

## 2022-02-02 NOTE — Progress Notes (Signed)
?   01/13/2022 0200  ?Provider Notification  ?Provider Name/Title Dr.Sommer  ?Date Provider Notified 01/22/2022  ?Time Provider Notified 0200  ?Method of Notification Call  ?Notification Reason Critical result  ?Test performed and critical result HGB 6.1  ?Date Critical Result Received 01/02/2022  ?Time Critical Result Received 0200  ?Provider response See new orders  ?Date of Provider Response 01/15/2022  ?Time of Provider Response 0210  ? ? ?

## 2022-02-02 NOTE — Progress Notes (Signed)
RT note. ?Patient taken off vent at this time per order/NP Harris. ?Patient left on Room air. ?RT will continue to monitor patient. ?

## 2022-02-02 NOTE — Death Summary Note (Signed)
?DEATH SUMMARY  ? ?Patient Details  ?Name: Justin Solomon ?MRN: [id number] ?DOB: 1950/10/26 ? ?Admission/Discharge Information  ? ?Admit Date:  Jan 24, 2022  ?Date of Death: Date of Death: 29-Jan-2022  ?Time of Death: Time of Death: 01/01/1933  ?Length of Stay: 5  ?Referring Physician: Aaron Edelman, MD  ? ?Reason(s) for Hospitalization  ?Shock, acute renal failure and hyponatremia ? ?Diagnoses  ?Preliminary cause of death:  ?Septic Shock due to Corynebacterium  ? ?Secondary Diagnoses (including complications and co-morbidities):  ?Principal Problem: ?  Septic shock due to gram-positive bacteria ([place]) ?Active Problems: ?  Bacteremia due to Gram-positive bacteria ?  Pressure injury of skin ?  Sacral wound ?  AKI (acute kidney injury) ([place]) ?  Atrial fibrillation ([place]) ?  Acute blood loss anemia ?  Anemia ?  Gastrointestinal hemorrhage ?  Hemorrhagic shock ([employer]) ?  Acute on chronic respiratory failure with hypoxia ([facility]) ?  Quadriparesis ([place]) ?  Encephalopathy ?  Tracheostomy status ([place]) ?  Malnutrition of moderate degree ?  Pericardial effusion ? ? ?Brief Hospital Course (including significant findings, care, treatment, and services provided and events leading to death)  ?ONIS ESTY is a 71 y.o. year old male quadriplegic due to cervical injury and prior CVA, supported with tracheostomy and PEG on long-term ventilation. He resided at [facility] on chronic mechanical ventilation. He was admitted with septic shock and acute renal failure due to large sacral pressure ulcer and Corynebacterium bacteremia. He was hyponatremic and encephalopathic, toxic / metabolic.  Course complicated by lower GI bleeding from apparent rectal ulceration from rectal tube. There may have also been some upper GI bleeding as melena was seen.  due to his multiple chronic conditions, poor prognosis, he was deemed to be a poor candidate for either GI intervention or HD should it become indicated. Also had a large pericardial effusion, was  deemed a poor candidate for pericardiocentesis. It was unclear whether the effusion was hemodynamically significant. Despite aggressive medical treatment including abx, pressors, as well as MV, he continued to decline. In particular he had progressive renal failure. Discussions were undertaken with patient's family. Based on his poor prognosis in the setting of MODS from septic shock, decision was made to transition to comfort care on 01-29-2022. He expired on 01/29/2022.  ? ? ? ?Pertinent Labs and Studies  ?Significant Diagnostic Studies ?US RENAL ? ?Result Date: 2022-01-24 ?CLINICAL DATA:  Acute kidney injury. EXAM: RENAL / URINARY TRACT ULTRASOUND COMPLETE COMPARISON:  None Available. FINDINGS: Right Kidney: Renal measurements: 12.6 x 5.8 x 5.2 cm = volume: 200 mL. Echogenicity within normal limits. There is no hydronephrosis. There is a 1 cm echogenic focus in the superior pole the right kidney, possibly calcification. Left Kidney: Renal measurements: 12.0 x 6.4 x 6.8 cm = volume: 273 mL. Echogenicity within normal limits. No mass or hydronephrosis visualized. Bladder: Under distended and not well evaluated. Other: Small amount of free fluid in the right upper quadrant. IMPRESSION: 1. No evidence for hydronephrosis. 2. Questionable right renal calculus. 3. Bladder not well evaluated. 4. Small amount of ascites in the right upper quadrant. Electronically Signed   By: Ronney Asters M.D.   On: 01-24-2022 23:17  ? ?DG CHEST PORT 1 VIEW ? ?Result Date: 01/05/2022 ?CLINICAL DATA:  PICC line placement next EXAM: PORTABLE CHEST 1 VIEW COMPARISON:  Portable exam 1900 hours compared to 01/04/2022 FINDINGS: Tracheostomy tube projects over tracheal air column. RIGHT arm PICC line tip projects over SVC. LEFT arm PICC line tip projects over  SVC at the azygous confluence. Enlargement of cardiac silhouette with pulmonary vascular congestion. BILATERAL infiltrates question pulmonary edema Coexistent atelectasis versus consolidation LEFT  lower lobe. Small LEFT pleural effusion. No pneumothorax IMPRESSION: Enlargement of cardiac silhouette with pulmonary vascular congestion and increased pulmonary edema. Coexistent small RIGHT pleural effusion and atelectasis versus consolidation LEFT lower lobe. Electronically Signed   By: Lavonia Dana M.D.   On: 01/05/2022 19:22  ? ?DG Chest Port 1 View ? ?Result Date: 01/04/2022 ?CLINICAL DATA:  Acute on chronic hypoxic respiratory failure EXAM: PORTABLE CHEST 1 VIEW COMPARISON:  Chest radiograph 1 day prior FINDINGS: The tracheostomy tube appears grossly stable. The right upper extremity PICC is stable terminating in the mid to lower SVC. The cardiac silhouette is significantly enlarged, unchanged. The upper mediastinal contours are prominent, unchanged. There is confluent retrocardiac opacity likely reflecting combination of pleural effusion and adjacent airspace opacity, not significantly changed. The left upper lung remains aerated. The right lung is clear with no new or worsening focal airspace disease. There is no significant right effusion. There is no pneumothorax. There is no acute osseous abnormality. IMPRESSION: 1. Unchanged retrocardiac opacity likely reflecting a pleural effusion and adjacent airspace opacity. No new or worsening focal airspace disease. 2. Unchanged enlarged cardiac silhouette. Pericardial effusion again not excluded. Electronically Signed   By: Valetta Mole M.D.   On: 01/04/2022 08:30  ? ?DG Chest Port 1 View ? ?Result Date: 01/07/2022 ?CLINICAL DATA:  Ventilator dependent. Weakness and abnormal renal function. History of hypertension, atrial fibrillation and stroke. EXAM: PORTABLE CHEST 1 VIEW COMPARISON:  Radiographs 04/16/2021 and 03/19/2021. FINDINGS: 1420 hours. Interval increased size of the cardiac silhouette consistent with cardiomegaly or a pericardial effusion. Tracheostomy and right arm PICC appear adequately positioned. There is increased vascular congestion with possible mild  edema. There is new left lower lobe airspace disease and a probable small left pleural effusion. Probable heterotopic ossification surrounding the left shoulder. No acute osseous findings are evident. Telemetry leads overlie the chest. IMPRESSION: Interval development of cardiomegaly and/or a pericardial effusion with mild pulmonary edema, left lower lobe airspace disease and a probable small left pleural effusion. Cannot exclude pneumonia. Radiographic follow up recommended. Electronically Signed   By: Richardean Sale M.D.   On: 01/23/2022 14:32  ? ?ECHOCARDIOGRAM COMPLETE ? ?Result Date: 01/04/2022 ?   ECHOCARDIOGRAM REPORT   Patient Name:   JAAFAR TRIPP Date of Exam: 01/04/2022 Medical Rec #:  [id number]      Height:       72.0 in Accession #:    [id number]     Weight:       229.3 lb Date of Birth:  22-Jul-1951       BSA:          2.258 m? Patient Age:    50 years       BP:           90/74 mmHg Patient Gender: M              HR:           109 bpm. Exam Location:  Inpatient Procedure: 2D Echo, Cardiac Doppler and Color Doppler Indications:    Dyspnea  History:        Patient has prior history of Echocardiogram examinations.                 Arrythmias:Atrial Fibrillation; Risk Factors:Hypertension.  Sonographer:    Jyl Heinz Referring Phys: [id number] Hortencia Conradi MEIER  Sonographer Comments:  Echo performed with patient supine and on artificial respirator. IMPRESSIONS  1. 2.42 cm pericardial effusion (firbrinous strands within effusion). There is respiratory partial collapse of right ventricular free wall. IVC is dilated. There is suggestion of impending cardiac tamponade. Large pericardial effusion. The pericardial effusion is circumferential. Findings are consistent with cardiac tamponade.  2. Left ventricular ejection fraction, by estimation, is 60 to 65%. The left ventricle has normal function. The left ventricle has no regional wall motion abnormalities. There is mild left ventricular hypertrophy. Left ventricular  diastolic parameters are consistent with Grade I diastolic dysfunction (impaired relaxation).  3. Right ventricular systolic function is normal. The right ventricular size is normal.  4. The aortic valve is t

## 2022-02-02 NOTE — Progress Notes (Signed)
eLink Physician-Brief Progress Note ?Patient Name: Justin Solomon ?DOB: December 04, 1950 ?MRN: [id number] ? ? ?Date of Service ? 15-Jan-2022  ?HPI/Events of Note ? Anemia - Hgb = 6.1.   ?eICU Interventions ? Will transfuse 1 unit PRBC.  ? ? ? ?Intervention Category ?Major Interventions: Other: ? ?Justin Solomon ?Jan 15, 2022, 2:24 AM ?

## 2022-02-02 DEATH — deceased

## 2022-05-11 IMAGING — CT CT ABD-PELV W/O CM
2 of 4 series · 16 of 46 positions shown, 18 images · non-contrast
Comparison: None.

CLINICAL DATA: Abdomen pain and rectal bleeding

EXAM:
CT ABDOMEN AND PELVIS WITHOUT CONTRAST
TECHNIQUE: Multidetector CT imaging of the abdomen and pelvis was performed
following the standard protocol without IV contrast.

[Series 2: axial st · axial · 0.98mm/px · z∈[+1108,+1528]mm · 13 of 94 slices shown, 15 images]
[im 5/94  soft-tissue]
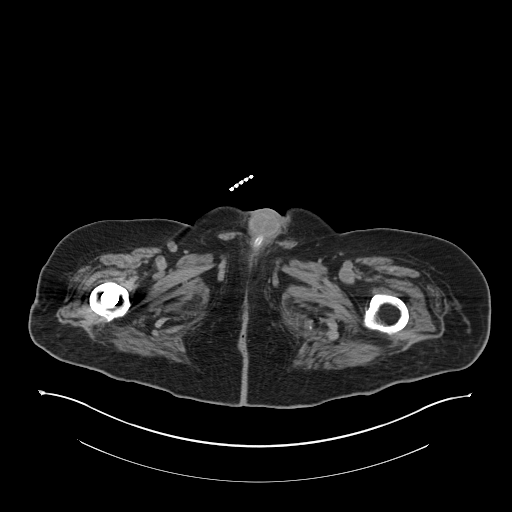
[im 5/94  bone]
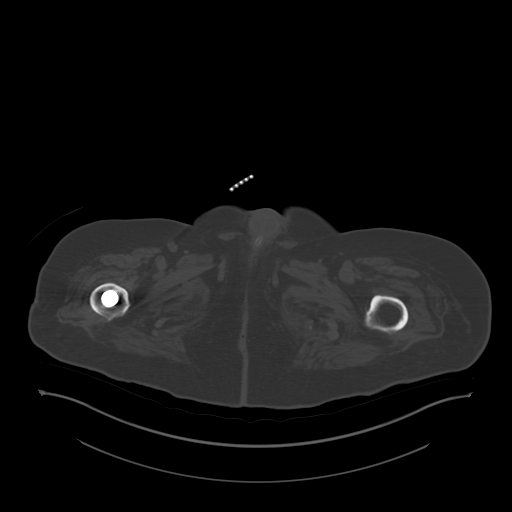
[im 14/94  soft-tissue]
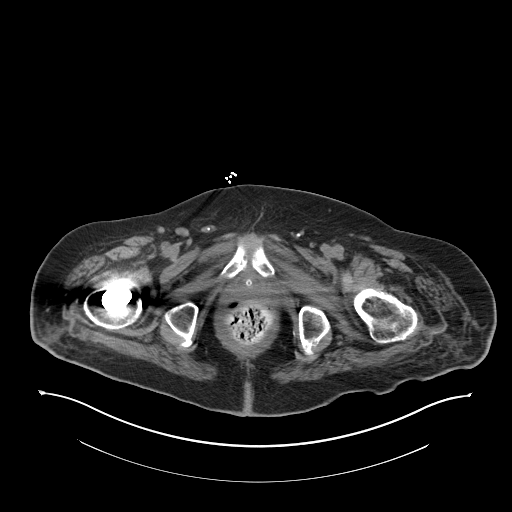
[im 19/94  soft-tissue]
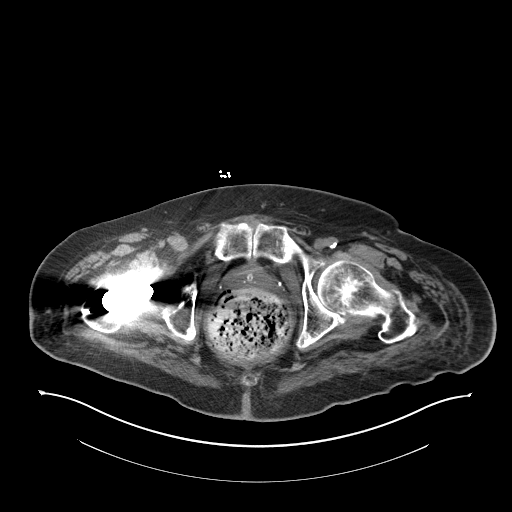
[im 28/94  soft-tissue]
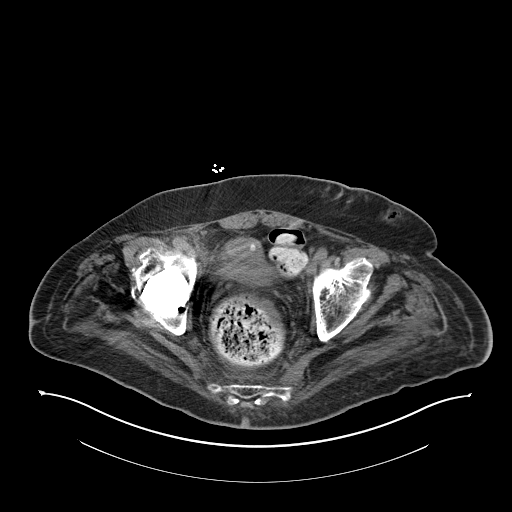
[im 33/94  soft-tissue]
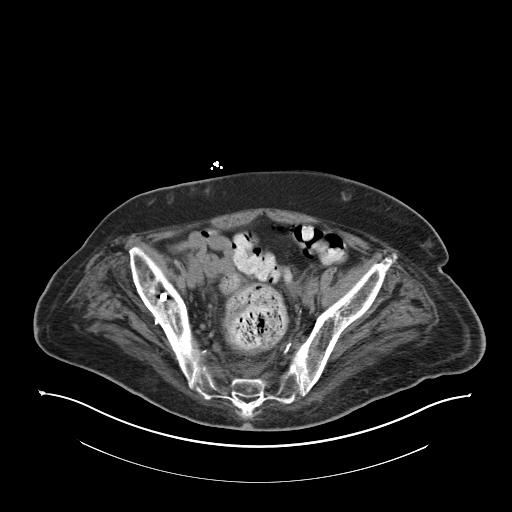
[im 42/94  soft-tissue]
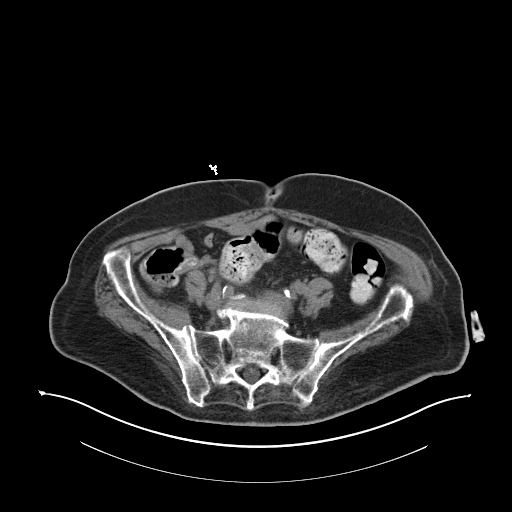
[im 47/94  soft-tissue]
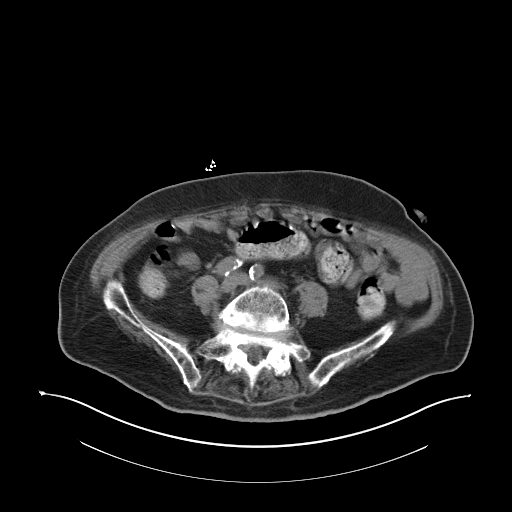
[im 52/94  soft-tissue]
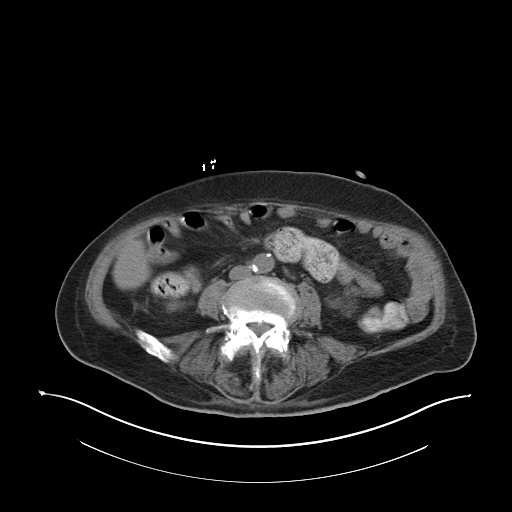
[im 61/94  soft-tissue]
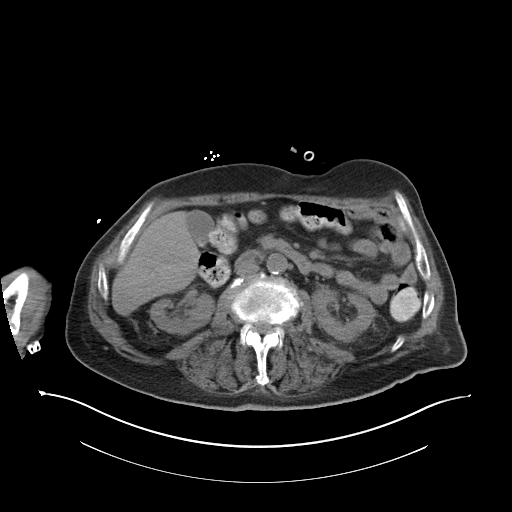
[im 61/94  bone]
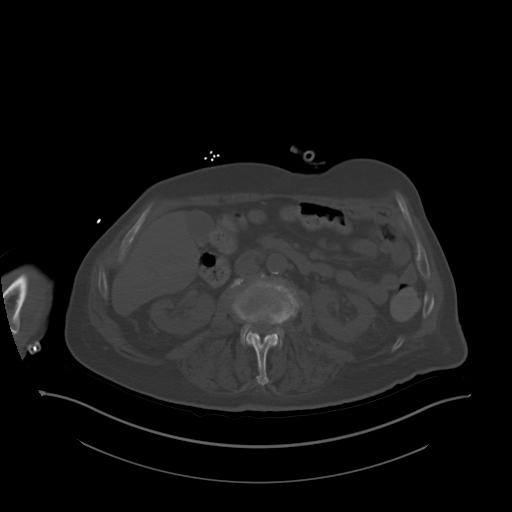
[im 66/94  soft-tissue]
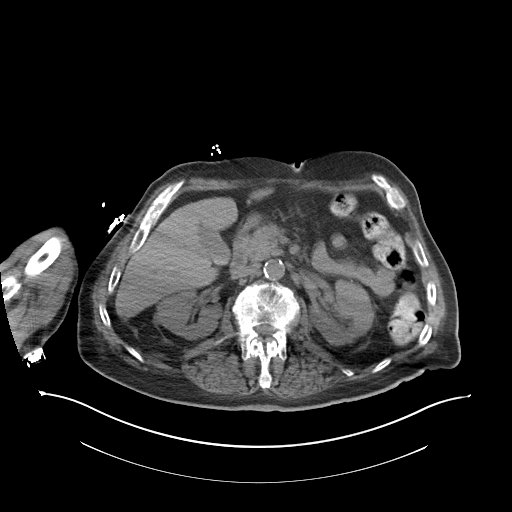
[im 75/94  soft-tissue]
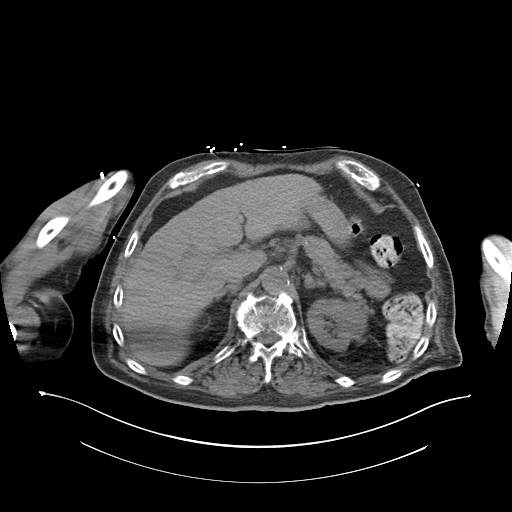
[im 80/94  soft-tissue]
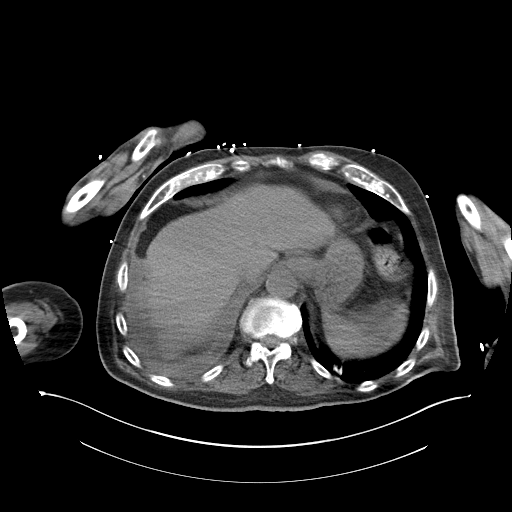
[im 89/94  soft-tissue]
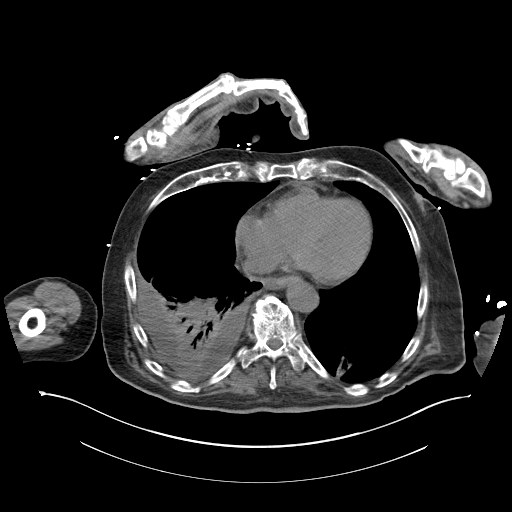

[Series 4: coronal st · coronal · 0.86mm/px · 3 of 136 slices shown]
[im 46/136  soft-tissue]
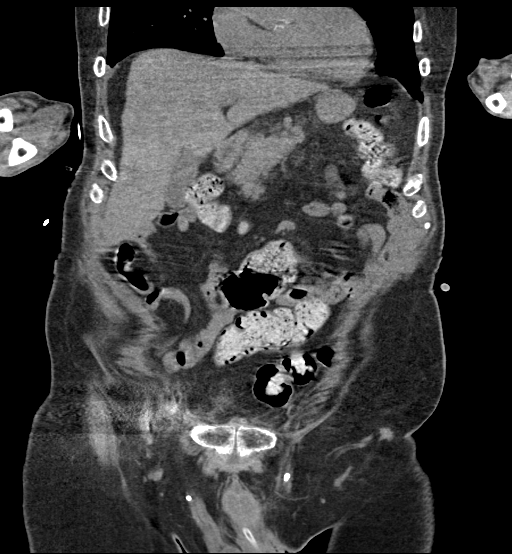
[im 61/136  soft-tissue]
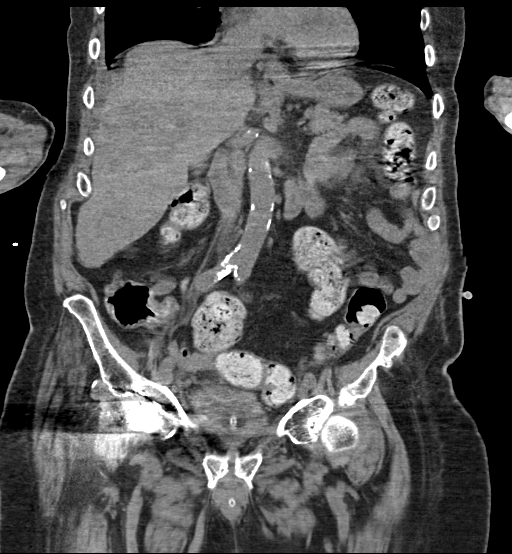
[im 76/136  soft-tissue]
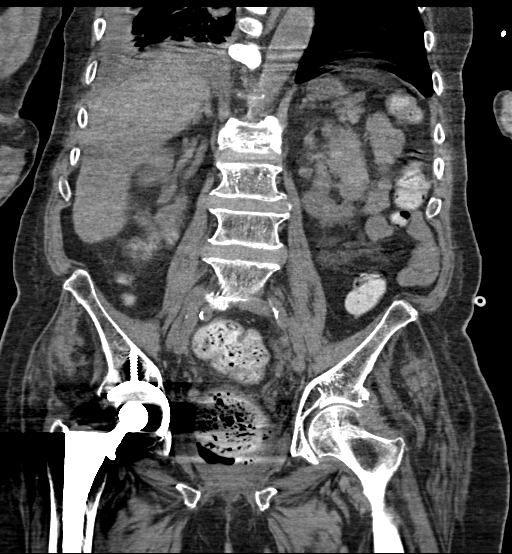

[16 of 46 positions shown; findings below may reference images not displayed]

FINDINGS: Lower chest: Lung bases demonstrate small slightly loculated right
pleural effusion. Partial consolidation in the right lower lobe.
Mild bronchiectasis in the right lower lobe. Mild focal
bronchiectasis in the posterior left lung base. Irregular nodular
density measuring 18 x 13 mm. Normal cardiac size. Trace pericardial
effusion.

Hepatobiliary: Small gallstones. No focal hepatic abnormality or
biliary dilatation

Pancreas: Unremarkable. No pancreatic ductal dilatation or
surrounding inflammatory changes.

Spleen: Normal in size without focal abnormality.

Adrenals/Urinary Tract: Adrenal glands are normal. Kidneys show no
hydronephrosis. Foley catheter in the bladder

Stomach/Bowel: Gastrostomy tube in the body of the stomach. No
dilated small bowel. Large amount of dense stool within the colon.
Moderate to marked rectal distension by feces with mild rectal wall
thickening and suggestion of minimal inflammation.

Vascular/Lymphatic: Moderate aortic atherosclerosis. No aneurysm. No
suspicious nodes.

Reproductive: Prostate is unremarkable.

Other: No free air.  Mild presacral edema and soft tissue stranding.

Musculoskeletal: Right hip replacement. Severe compression deformity
L2 vertebral body uncertain age.
IMPRESSION: 1. Large volume of stool throughout the colon suggestive of
constipation. Moderate to marked rectal distension by fecal material
with mild rectal wall thickening and suggestion of mild perirectal
inflammation, question proctitis or early changes of stercoral
colitis
2. Small gallstones
3. Small slightly loculated right pleural effusion with partial
consolidation in the right lower lobe, possible pneumonia.
4. 18 mm nodular area of consolidation or nodule within the
posterior left lung base. Consider one of the following in 3 months
for both low-risk and high-risk individuals: (a) repeat chest CT,
(b) follow-up PET-CT, or (c) tissue sampling. This recommendation
follows the consensus statement: Guidelines for Management of
Incidental Pulmonary Nodules Detected on CT Images: From the
5. Severe compression deformity of L2 of uncertain age

## 2022-05-12 IMAGING — DX DG CHEST 1V PORT
1 series · 1 of 1 positions shown · non-contrast
Comparison: 02/02/2021

CLINICAL DATA: Postop tracheostomy tube change

EXAM:
PORTABLE CHEST 1 VIEW

[chest ap]
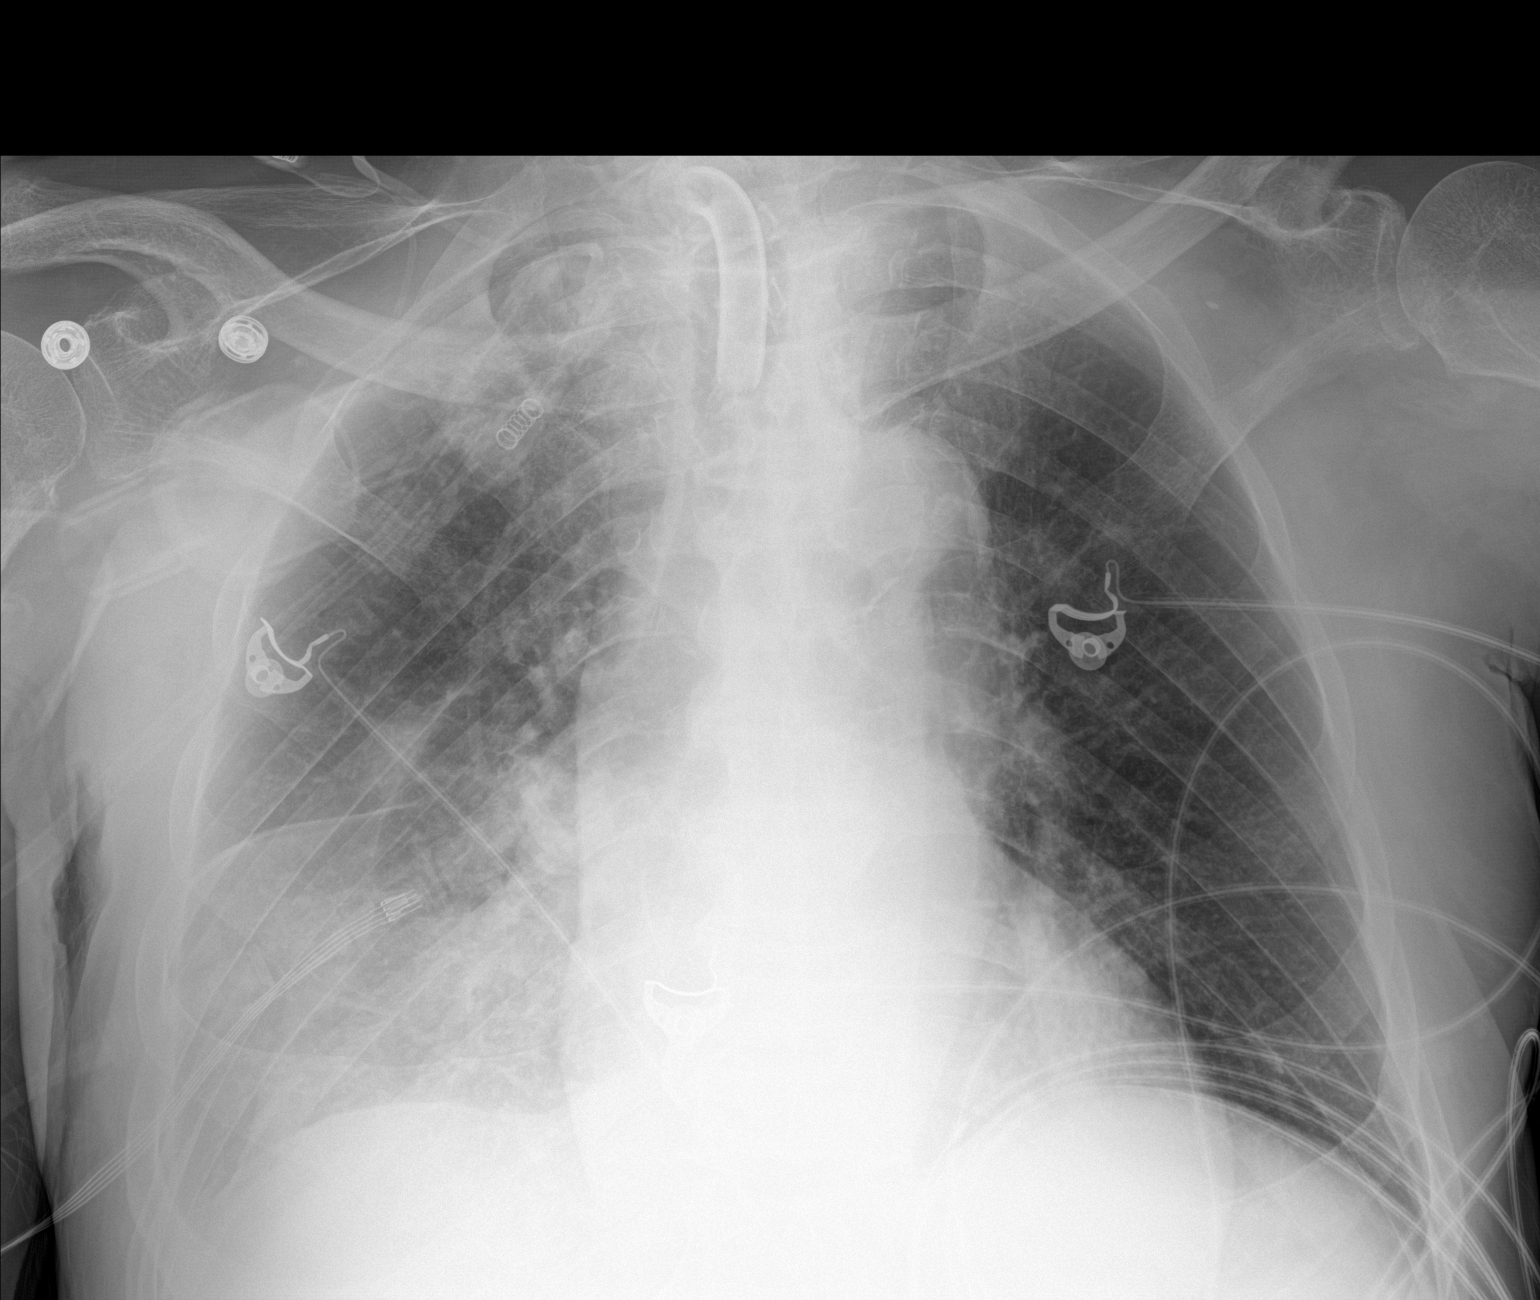

[1 of 1 positions shown; findings below may reference images not displayed]

FINDINGS: Tracheostomy in good position.

Right lower lobe airspace disease has progressed. Possible pneumonia
with associated small effusion.

Mild left lower lobe airspace disease also with progression. No left
effusion
IMPRESSION: Tracheostomy in good position

Progression of bibasilar airspace disease right greater than left,
suspicious for pneumonia.

## 2022-05-30 IMAGING — DX DG CHEST 1V PORT
1 series · 1 of 1 positions shown · non-contrast
Comparison: 02/15/2021 portable chest and earlier.

CLINICAL DATA: 70-year-old male with respiratory failure.
Tracheostomy. Recent cerebral infarcts, likely embolic.

EXAM:
PORTABLE CHEST 1 VIEW

[chest ap]
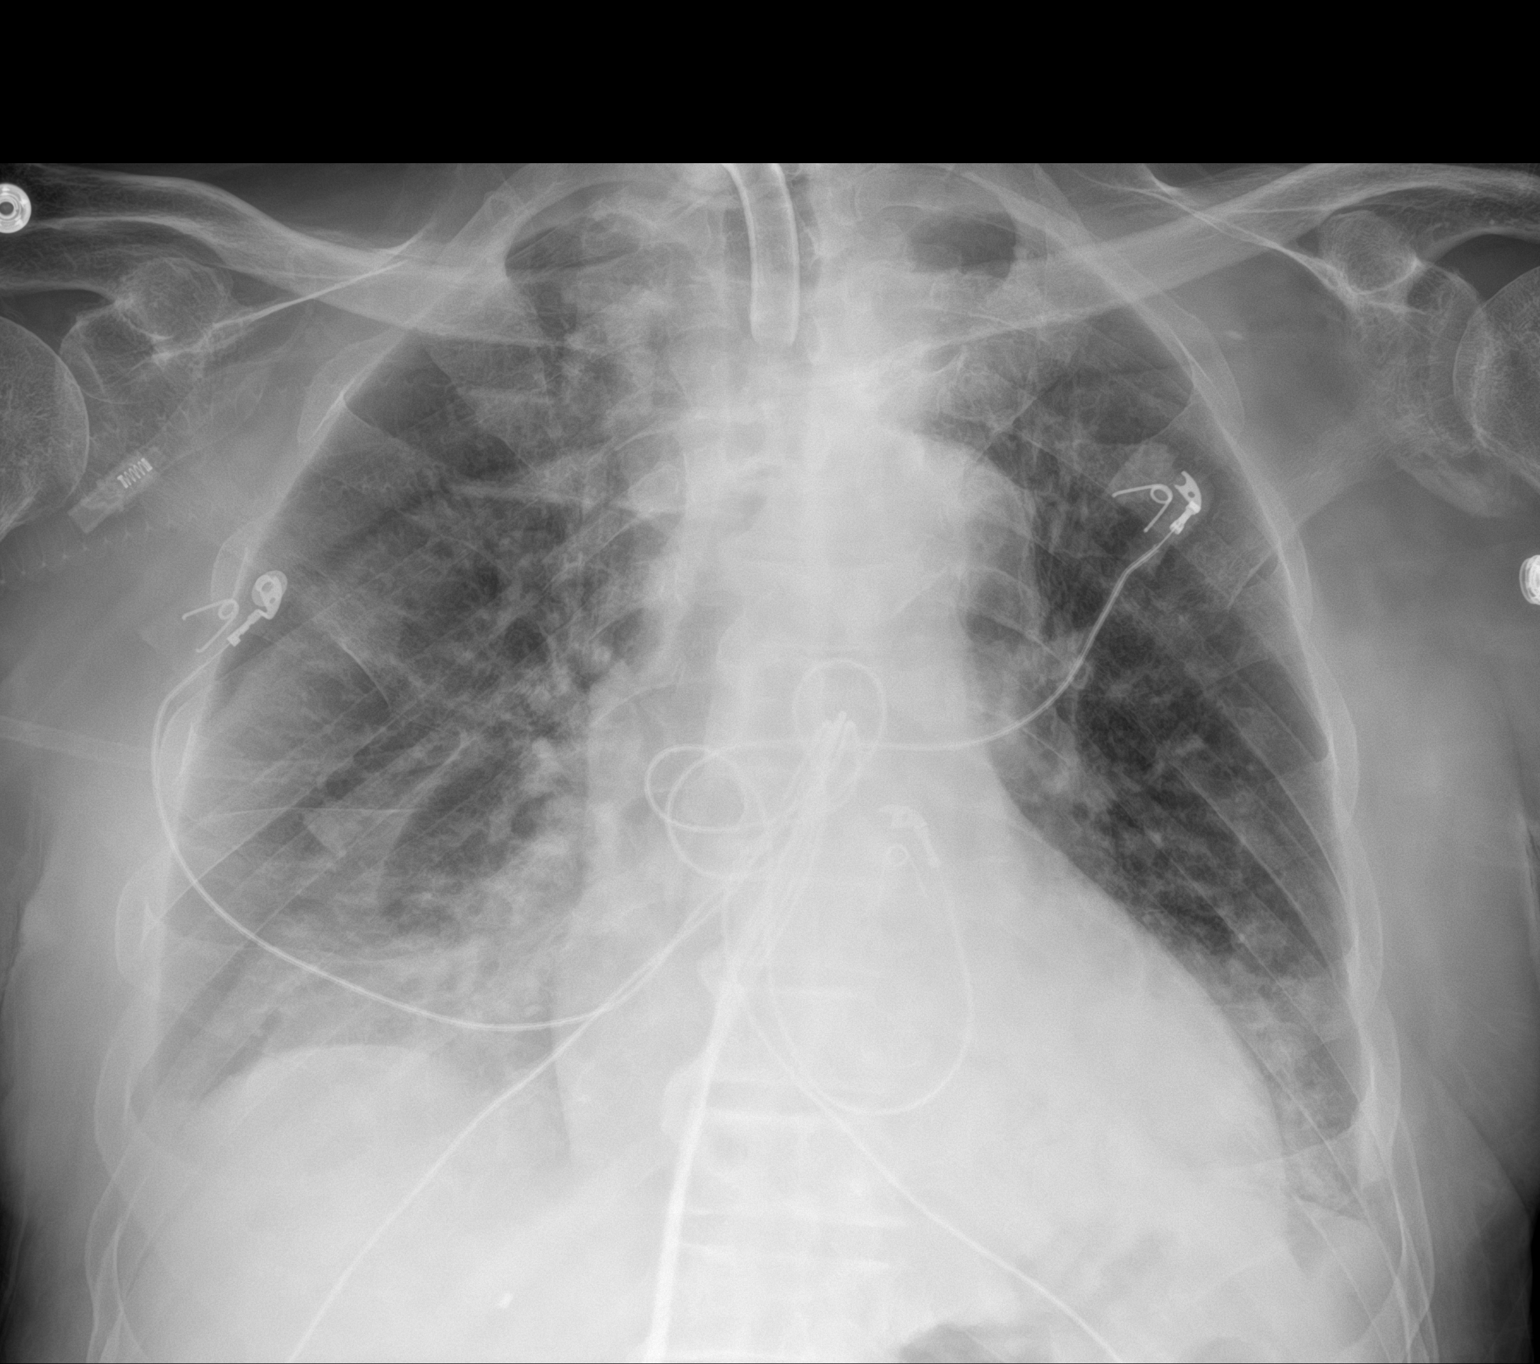

[1 of 1 positions shown; findings below may reference images not displayed]

FINDINGS: Portable AP semi upright view at 8567 hours. Tracheostomy is stable
with no adverse features. Right lung ventilation has mildly improved
since 02/10/2021, and with less veiling opacity now compared to all
priors. Residual Patchy and confluent right lower lung opacity.
Retrocardiac opacity at the left lung base is stable. No
pneumothorax or pulmonary edema. No definite effusion. Mediastinal
contours are stable and within normal limits. No acute osseous
abnormality identified. Partially visible ACDF. Negative visible
bowel gas.
IMPRESSION: 1. Improved right lung ventilation suspected due to regression of
multilobar pneumonia. Left lower lobe could also be involved with
stable consolidation versus atelectasis.
2. No new cardiopulmonary abnormality.
